# Patient Record
Sex: Male | Born: 1965 | Race: Black or African American | Hispanic: No | Marital: Single | Smoking: Former smoker
Health system: Southern US, Community
[De-identification: ages and names within clinical notes are randomized; demographics above are authoritative.]

## PROBLEM LIST (undated history)

## (undated) DIAGNOSIS — R079 Chest pain, unspecified: Secondary | ICD-10-CM

## (undated) DIAGNOSIS — R7989 Other specified abnormal findings of blood chemistry: Secondary | ICD-10-CM

## (undated) DIAGNOSIS — N183 Chronic kidney disease, stage 3 unspecified: Secondary | ICD-10-CM

## (undated) DIAGNOSIS — Z91148 Patient's other noncompliance with medication regimen for other reason: Secondary | ICD-10-CM

## (undated) DIAGNOSIS — I77810 Thoracic aortic ectasia: Secondary | ICD-10-CM

## (undated) DIAGNOSIS — R778 Other specified abnormalities of plasma proteins: Secondary | ICD-10-CM

## (undated) DIAGNOSIS — I10 Essential (primary) hypertension: Secondary | ICD-10-CM

## (undated) DIAGNOSIS — Z9114 Patient's other noncompliance with medication regimen: Secondary | ICD-10-CM

## (undated) DIAGNOSIS — I5032 Chronic diastolic (congestive) heart failure: Secondary | ICD-10-CM

## (undated) HISTORY — DX: Chest pain, unspecified: R07.9

## (undated) HISTORY — PX: CHOLECYSTECTOMY: SHX55

## (undated) HISTORY — DX: Other specified abnormal findings of blood chemistry: R79.89

## (undated) HISTORY — DX: Thoracic aortic ectasia: I77.810

## (undated) HISTORY — DX: Other specified abnormalities of plasma proteins: R77.8

## (undated) HISTORY — PX: HERNIA REPAIR: SHX51

---

## 2015-10-17 ENCOUNTER — Inpatient Hospital Stay
Admission: EM | Admit: 2015-10-17 | Discharge: 2015-10-21 | DRG: 291 | Disposition: A | Discharge status: Home / Self Care | Payer: Self-pay | Attending: Internal Medicine | Admitting: Internal Medicine

## 2015-10-17 ENCOUNTER — Encounter: Payer: Self-pay | Admitting: Emergency Medicine

## 2015-10-17 ENCOUNTER — Emergency Department: Payer: Self-pay

## 2015-10-17 DIAGNOSIS — E669 Obesity, unspecified: Secondary | ICD-10-CM | POA: Diagnosis present

## 2015-10-17 DIAGNOSIS — M25421 Effusion, right elbow: Secondary | ICD-10-CM

## 2015-10-17 DIAGNOSIS — Z6838 Body mass index (BMI) 38.0-38.9, adult: Secondary | ICD-10-CM

## 2015-10-17 DIAGNOSIS — I509 Heart failure, unspecified: Secondary | ICD-10-CM

## 2015-10-17 DIAGNOSIS — N183 Chronic kidney disease, stage 3 (moderate): Secondary | ICD-10-CM | POA: Diagnosis present

## 2015-10-17 DIAGNOSIS — G4733 Obstructive sleep apnea (adult) (pediatric): Secondary | ICD-10-CM | POA: Diagnosis present

## 2015-10-17 DIAGNOSIS — Z9114 Patient's other noncompliance with medication regimen: Secondary | ICD-10-CM

## 2015-10-17 DIAGNOSIS — M25429 Effusion, unspecified elbow: Secondary | ICD-10-CM | POA: Insufficient documentation

## 2015-10-17 DIAGNOSIS — I10 Essential (primary) hypertension: Secondary | ICD-10-CM | POA: Diagnosis present

## 2015-10-17 DIAGNOSIS — I160 Hypertensive urgency: Secondary | ICD-10-CM

## 2015-10-17 DIAGNOSIS — I130 Hypertensive heart and chronic kidney disease with heart failure and stage 1 through stage 4 chronic kidney disease, or unspecified chronic kidney disease: Principal | ICD-10-CM | POA: Diagnosis present

## 2015-10-17 DIAGNOSIS — N179 Acute kidney failure, unspecified: Secondary | ICD-10-CM | POA: Diagnosis present

## 2015-10-17 DIAGNOSIS — M70821 Other soft tissue disorders related to use, overuse and pressure, right upper arm: Secondary | ICD-10-CM | POA: Diagnosis present

## 2015-10-17 DIAGNOSIS — M25529 Pain in unspecified elbow: Secondary | ICD-10-CM

## 2015-10-17 DIAGNOSIS — I5033 Acute on chronic diastolic (congestive) heart failure: Secondary | ICD-10-CM | POA: Diagnosis present

## 2015-10-17 DIAGNOSIS — Z8249 Family history of ischemic heart disease and other diseases of the circulatory system: Secondary | ICD-10-CM

## 2015-10-17 DIAGNOSIS — M25521 Pain in right elbow: Secondary | ICD-10-CM

## 2015-10-17 DIAGNOSIS — I5021 Acute systolic (congestive) heart failure: Secondary | ICD-10-CM | POA: Diagnosis present

## 2015-10-17 DIAGNOSIS — Z87891 Personal history of nicotine dependence: Secondary | ICD-10-CM

## 2015-10-17 DIAGNOSIS — R079 Chest pain, unspecified: Secondary | ICD-10-CM

## 2015-10-17 HISTORY — DX: Essential (primary) hypertension: I10

## 2015-10-17 LAB — BRAIN NATRIURETIC PEPTIDE: B Natriuretic Peptide: 337 pg/mL — ABNORMAL HIGH (ref 0.0–100.0)

## 2015-10-17 LAB — COMPREHENSIVE METABOLIC PANEL
ALT: 43 U/L (ref 17–63)
AST: 38 U/L (ref 15–41)
Albumin: 3.9 g/dL (ref 3.5–5.0)
Alkaline Phosphatase: 83 U/L (ref 38–126)
Anion gap: 7 (ref 5–15)
BUN: 24 mg/dL — AB (ref 6–20)
CHLORIDE: 107 mmol/L (ref 101–111)
CO2: 27 mmol/L (ref 22–32)
CREATININE: 1.85 mg/dL — AB (ref 0.61–1.24)
Calcium: 8.8 mg/dL — ABNORMAL LOW (ref 8.9–10.3)
GFR, EST AFRICAN AMERICAN: 47 mL/min — AB (ref >60)
GFR, EST NON AFRICAN AMERICAN: 41 mL/min — AB (ref >60)
Glucose, Bld: 100 mg/dL — ABNORMAL HIGH (ref 65–99)
POTASSIUM: 3.4 mmol/L — AB (ref 3.5–5.1)
SODIUM: 141 mmol/L (ref 135–145)
Total Bilirubin: 0.5 mg/dL (ref 0.3–1.2)
Total Protein: 7.3 g/dL (ref 6.5–8.1)

## 2015-10-17 LAB — CBC
HEMATOCRIT: 41 % (ref 40.0–52.0)
HEMOGLOBIN: 13.8 g/dL (ref 13.0–18.0)
MCH: 29.4 pg (ref 26.0–34.0)
MCHC: 33.6 g/dL (ref 32.0–36.0)
MCV: 87.6 fL (ref 80.0–100.0)
Platelets: 228 10*3/uL (ref 150–440)
RBC: 4.68 MIL/uL (ref 4.40–5.90)
RDW: 13.7 % (ref 11.5–14.5)
WBC: 8.5 10*3/uL (ref 3.8–10.6)

## 2015-10-17 LAB — DIFFERENTIAL
BASOS ABS: 0.1 10*3/uL (ref 0–0.1)
BASOS PCT: 1 %
EOS ABS: 0.2 10*3/uL (ref 0–0.7)
Eosinophils Relative: 3 %
Lymphocytes Relative: 27 %
Lymphs Abs: 2.3 10*3/uL (ref 1.0–3.6)
Monocytes Absolute: 0.7 10*3/uL (ref 0.2–1.0)
Monocytes Relative: 8 %
NEUTROS ABS: 5.2 10*3/uL (ref 1.4–6.5)
Neutrophils Relative %: 61 %

## 2015-10-17 LAB — TROPONIN I
TROPONIN I: 0.27 ng/mL — AB (ref <0.03)
Troponin I: 0.36 ng/mL (ref <0.03)

## 2015-10-17 LAB — PROTIME-INR
INR: 1.04
PROTHROMBIN TIME: 13.8 s (ref 11.4–15.0)

## 2015-10-17 LAB — APTT: APTT: 28 s (ref 24–36)

## 2015-10-17 MED ORDER — ONDANSETRON HCL 4 MG PO TABS: 4.0000 mg | ORAL_TABLET | Freq: Four times a day (QID) | ORAL | Status: DC | PRN

## 2015-10-17 MED ORDER — MORPHINE SULFATE (PF) 2 MG/ML IV SOLN
2.0000 mg | INTRAVENOUS | Status: DC | PRN
  Administered 2015-10-18 – 2015-10-19 (×4): 2 mg via INTRAVENOUS
  Filled 2015-10-17 (×4): qty 1

## 2015-10-17 MED ORDER — ONDANSETRON HCL 4 MG/2ML IJ SOLN
4.0000 mg | Freq: Once | INTRAMUSCULAR | Status: AC
  Administered 2015-10-17: 4 mg via INTRAVENOUS
  Filled 2015-10-17: qty 2

## 2015-10-17 MED ORDER — HEPARIN SODIUM (PORCINE) 5000 UNIT/ML IJ SOLN
5000.0000 [IU] | Freq: Three times a day (TID) | INTRAMUSCULAR | Status: DC
  Administered 2015-10-18 (×2): 5000 [IU] via SUBCUTANEOUS
  Filled 2015-10-17 (×2): qty 1

## 2015-10-17 MED ORDER — LABETALOL HCL 5 MG/ML IV SOLN
10.0000 mg | INTRAVENOUS | Status: DC | PRN
  Administered 2015-10-18: 10 mg via INTRAVENOUS
  Administered 2015-10-19: 20 mg via INTRAVENOUS
  Filled 2015-10-17 (×2): qty 4

## 2015-10-17 MED ORDER — ASPIRIN 81 MG PO CHEW
324.0000 mg | CHEWABLE_TABLET | Freq: Once | ORAL | Status: AC
  Administered 2015-10-17: 324 mg via ORAL
  Filled 2015-10-17: qty 4

## 2015-10-17 MED ORDER — ACETAMINOPHEN 325 MG PO TABS
650.0000 mg | ORAL_TABLET | Freq: Four times a day (QID) | ORAL | Status: DC | PRN
  Administered 2015-10-20: 650 mg via ORAL
  Filled 2015-10-17: qty 2

## 2015-10-17 MED ORDER — NITROGLYCERIN 2 % TD OINT
1.0000 [in_us] | TOPICAL_OINTMENT | Freq: Once | TRANSDERMAL | Status: AC
  Administered 2015-10-17: 1 [in_us] via TOPICAL
  Filled 2015-10-17: qty 1

## 2015-10-17 MED ORDER — HYDRALAZINE HCL 20 MG/ML IJ SOLN
10.0000 mg | Freq: Once | INTRAMUSCULAR | Status: AC
  Administered 2015-10-17: 10 mg via INTRAVENOUS

## 2015-10-17 MED ORDER — HYDRALAZINE HCL 20 MG/ML IJ SOLN
20.0000 mg | Freq: Once | INTRAMUSCULAR | Status: DC
  Filled 2015-10-17: qty 1

## 2015-10-17 MED ORDER — ONDANSETRON HCL 4 MG/2ML IJ SOLN
4.0000 mg | Freq: Four times a day (QID) | INTRAMUSCULAR | Status: DC | PRN
  Administered 2015-10-18: 4 mg via INTRAVENOUS
  Filled 2015-10-17: qty 2

## 2015-10-17 MED ORDER — ACETAMINOPHEN 650 MG RE SUPP: 650.0000 mg | Freq: Four times a day (QID) | RECTAL | Status: DC | PRN

## 2015-10-17 MED ORDER — MORPHINE SULFATE (PF) 4 MG/ML IV SOLN
4.0000 mg | Freq: Once | INTRAVENOUS | Status: AC
  Administered 2015-10-17: 4 mg via INTRAVENOUS
  Filled 2015-10-17: qty 1

## 2015-10-17 MED ORDER — HYDROCODONE-ACETAMINOPHEN 5-325 MG PO TABS
1.0000 | ORAL_TABLET | ORAL | Status: DC | PRN
  Administered 2015-10-18 (×2): 1 via ORAL
  Filled 2015-10-17 (×3): qty 1

## 2015-10-17 MED ORDER — FUROSEMIDE 10 MG/ML IJ SOLN
60.0000 mg | Freq: Once | INTRAMUSCULAR | Status: AC
  Administered 2015-10-17: 60 mg via INTRAVENOUS
  Filled 2015-10-17: qty 8

## 2015-10-17 NOTE — Progress Notes (Signed)
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 When asked about his medication regimen, pt states that he doesn't get his medication filled because he is on the go from working so much. Pt's work schedule is not for financial reasons, but out of his enjoyment of working.  Per pt he attempts to watch what he is eating, but is not consistent. Also he "does not have the time to exercise". Rn educated the pt on the risk of not taking care of himself. Pt states that he does not see having a heart attack or stroke happening to him. RN reiterated the importance of changing lifestyle, and adequately taking care of himself. Will continue to monitor, and provide education as needed.

## 2015-10-17 NOTE — ED Notes (Signed)
Patient presents to the ED with shortness of breath and left sided chest pain x 2 days with right arm pain and weakness, dizziness, and headache.  Patient is alert and oriented x 4 and speaking in full sentences. Patient's smile appears slightly uneven to this RN.  Patient states all symptoms began two days ago.  Patient is in no obvious distress at this time.

## 2015-10-17 NOTE — ED Notes (Signed)
Meal tray given 

## 2015-10-17 NOTE — ED Provider Notes (Addendum)
[facility] Emergency Department Provider Note  ____________________________________________   I have reviewed the triage vital signs and the nursing notes.   HISTORY  Chief Complaint Shortness of Breath; Chest Pain; Dizziness; and Arm Pain    HPI Trevor Nunez is a 50 y.o. male presents today complaining of chest pain and right arm pain. Patient also has orthopnea, exertional shortness of breath. He states his symptoms of in there for 3 days. Patient has a history of untreated hypertension. Every time and he was ever checked his pressure he states is "very high". He was admitted for hypertensive urgency in [facility] 2 years ago but never followed up and does not take any medications. He does smoke until 2 months ago. He denies any history of PE or DVT. He states over the last couple weeks she's had increasing lower extremity edema. He states he began to develop orthopnea and exertional symptoms or last 3 days. He has had on and off chest discomfort which is a squeezing-like left-sided chest pain. He sometimes has pain in his right arm. He cannot say if the 2 are connected. Denies any significant headache or numbness or weakness. Patient does not have a history of CHF. He takes no medications. Does not know if he has a history of high cholesterol and does not follow with physician's. At this time, his pain is a 1 or 2 out of 10, "very slight".       Past Medical History  Diagnosis Date  . Hypertension     There are no active problems to display for this patient.   No past surgical history on file.  No current outpatient prescriptions on file.  Allergies Codeine  No family history on file.  Social History Social History  Substance Use Topics  . Smoking status: Former Smoker    Quit date: 08/17/2015  . Smokeless tobacco: None  . Alcohol Use: No    Review of Systems Constitutional: No fever/chills Eyes: No visual changes. ENT: No sore throat. No stiff  neck no neck pain Cardiovascular: Positive chest pain. Respiratory: Positive shortness of breath. Gastrointestinal:   no vomiting.  No diarrhea.  No constipation. Genitourinary: Positive for dysuria. Musculoskeletal: Negative lower extremity swelling Skin: Negative for rash. Neurological: Negative for headaches, focal weakness or numbness. 10-point ROS otherwise negative.  ____________________________________________   PHYSICAL EXAM:  VITAL SIGNS: ED Triage Vitals  Enc Vitals Group     BP 10/17/15 1709 197/115 mmHg     Pulse Rate 10/17/15 1709 84     Resp 10/17/15 1709 18     Temp 10/17/15 1709 98.4 F (36.9 C)     Temp Source 10/17/15 1709 Oral     SpO2 10/17/15 1709 96 %     Weight 10/17/15 1709 282 lb (127.914 kg)     Height 10/17/15 1709 6' (1.829 m)     Head Cir --      Peak Flow --      Pain Score 10/17/15 1710 7     Pain Loc --      Pain Edu? --      Excl. in GC? --     Constitutional: Alert and oriented. Well appearing and in no acute distress. Eyes: Conjunctivae are normal. PERRL. EOMI. Head: Atraumatic. Nose: No congestion/rhinnorhea. Mouth/Throat: Mucous membranes are moist.  Oropharynx non-erythematous. Neck: No stridor.   Nontender with no meningismus Cardiovascular: Normal rate, regular rhythm. Grossly normal heart sounds.  Good peripheral circulation. Chest: Somewhat tender to palpation, this  does seem to reproduce his pain. No crepitus or flail chest. Respiratory: Normal respiratory effort.  No retractions. Lungs CTAB, mildly diminished in the bases Abdominal: Soft and nontender. No distention. No guarding no rebound or obesity noted. Back:  There is no focal tenderness or step off there is no midline tenderness there are no lesions noted. there is no CVA tenderness Musculoskeletal: No lower extremity tenderness. No joint effusions, no DVT signs strong distal pulses 2+ pitting symmetric edema Neurologic:  Normal speech and language. No gross focal  neurologic deficits are appreciated.  Skin:  Skin is warm, dry and intact. No rash noted. Psychiatric: Mood and affect are normal. Speech and behavior are normal.  ____________________________________________   LABS (all labs ordered are listed, but only abnormal results are displayed)  Labs Reviewed  TROPONIN I - Abnormal; Notable for the following:    Troponin I 0.36 (*)    All other components within normal limits  COMPREHENSIVE METABOLIC PANEL - Abnormal; Notable for the following:    Potassium 3.4 (*)    Glucose, Bld 100 (*)    BUN 24 (*)    Creatinine, Ser 1.85 (*)    Calcium 8.8 (*)    GFR calc non Af Amer 41 (*)    GFR calc Af Amer 47 (*)    All other components within normal limits  BRAIN NATRIURETIC PEPTIDE - Abnormal; Notable for the following:    B Natriuretic Peptide 337.0 (*)    All other components within normal limits  CBC  PROTIME-INR  APTT  DIFFERENTIAL   ____________________________________________  EKG  I personally interpreted any EKGs ordered by me or triage Normal sinus rhythm rate 83 bpm, there is J-point elevation in V1 and V2, no acute ST depression, LAD is noted, LVH is noted. LAFB is noted.  Repeat EKG shows again some J-point elevation or acute ST elevation otherwise, nonspecific ST changes, LAD, LAFB. ____________________________________________  RADIOLOGY  I reviewed any imaging ordered by me or triage that were performed during my shift and, if possible, patient and/or family made aware of any abnormal findings. ____________________________________________   PROCEDURES  Procedure(s) performed: None  Critical Care performed: None  ____________________________________________   INITIAL IMPRESSION / ASSESSMENT AND PLAN / ED COURSE  Pertinent labs & imaging results that were available during my care of the patient were reviewed by me and considered in my medical decision making (see chart for details).  Patient with chronically  very poorly controlled hypertension who is noncompliant with any medications and does not have any cardiac history that he knows of presents today with chest pain on the left side which is been there for 3 days as well as right arm pain. Patient at this time states his pain is nearly gone, one or 2 out of 10. I was concerned about his EKG I did talk to Dr. Hillis RangeJames Allred of radiology who also evaluated the EKG he does not feel this meets criteria for ST elevation myocardial infarction. He agrees with nitroglycerin, or blood pressure goal for this patient is difficult to assess as the patient has uncontrolled hypertension at baseline and he is not sure what numbers he normally has. It is clear that [facility] with uncontrolled hypertension at that time. Patient will be given aspirin here we will reassess. His troponin is 0.36 which is certainly borderline. He may benefit from heparin drip. We will reassess closely.  ----------------------------------------- 6:30 PM on 10/17/2015 -----------------------------------------  The patient remains in no acute distress complaining of mild ongoing chest discomfort which does not seem to have been changed for the nitroglycerin which he just got. Aspirin has been administered. Very difficult to know exactly what blood pressure which name for this patient given his very significant history of very poorly controlled hypertension. I am reluctant to accidentally bring his pressure down too quickly. We will try morphine, we will give him Lasix for his evidence of CHF as is evident clinically and by lab work, and we will monitor him closely. We'll give him hydralazine if this is not resolving   ----------------------------------------- 8:03 PM on 10/17/2015 ----------------------------------------- Patient feels "practically" pain-free blood pressures in the 170s will admit to the hospital.  Repeat EKG does not show any acute  ischemia. We'll admit to the hospitalist.  ____________________________________________   FINAL CLINICAL IMPRESSION(S) / ED DIAGNOSES  Final diagnoses:  None      This chart was dictated using voice recognition software.  Despite best efforts to proofread,  errors can occur which can change meaning.     Jeanmarie PlantJames A Bartlett Enke, MD 10/17/15 1809  Jeanmarie PlantJames A Albin Duckett, MD 10/17/15 [id number]  Jeanmarie PlantJames A Japhet Morgenthaler, MD 10/17/15 2003  Jeanmarie PlantJames A Sukhman Martine, MD 10/17/15 2004

## 2015-10-17 NOTE — H&P (Signed)
[facility]   PATIENT NAME: Trevor Nunez    MR#:  [id number]  DATE OF BIRTH:  04/07/1966  DATE OF ADMISSION:  10/17/2015  PRIMARY CARE PHYSICIAN: No PCP Per Patient   REQUESTING/REFERRING PHYSICIAN: Alphonzo Lemmings, MD  CHIEF COMPLAINT:   Chief Complaint  Patient presents with  . Shortness of Breath  . Chest Pain  . Dizziness  . Arm Pain    HISTORY OF PRESENT ILLNESS:  Trevor Nunez  is a 50 y.o. male who presents with Chest pain radiating to his right arm for the last 4-5 days, shortness of breath, orthopnea. Patient states this chest pain has been constant during this time, does get worse with exertion, and is associated with some nausea. He denies diaphoresis. He does state that he has had some headaches. In the ED today he was found to have an elevated troponin, elevated BNP, significantly elevated blood pressure. Given his picture of elevated troponin, likely acute heart failure, an accelerated hypertension hospitalists were called for admission and further evaluation.  PAST MEDICAL HISTORY:   Past Medical History  Diagnosis Date  . Hypertension     PAST SURGICAL HISTORY:   Past Surgical History  Procedure Laterality Date  . Cholecystectomy    . Hernia repair      SOCIAL HISTORY:   Social History  Substance Use Topics  . Smoking status: Former Smoker    Quit date: 08/17/2015  . Smokeless tobacco: Not on file  . Alcohol Use: No    FAMILY HISTORY:   Family History  Problem Relation Age of Onset  . Hypertension Mother     DRUG ALLERGIES:   Allergies  Allergen Reactions  . Codeine Palpitations    MEDICATIONS AT HOME:   Prior to Admission medications   Medication Sig Start Date End Date Taking? Authorizing Provider  ibuprofen (ADVIL,MOTRIN) 200 MG tablet Take 400 mg by mouth every 6 (six) hours as needed for headache or mild pain.   Yes Historical Provider, MD    REVIEW OF SYSTEMS:  Review of Systems   Constitutional: Negative for fever, chills, weight loss and malaise/fatigue.  HENT: Negative for ear pain, hearing loss and tinnitus.   Eyes: Negative for blurred vision, double vision, pain and redness.  Respiratory: Positive for shortness of breath. Negative for cough and hemoptysis.   Cardiovascular: Positive for chest pain, orthopnea and leg swelling. Negative for palpitations.  Gastrointestinal: Negative for nausea, vomiting, abdominal pain, diarrhea and constipation.  Genitourinary: Negative for dysuria, frequency and hematuria.  Musculoskeletal: Negative for back pain, joint pain and neck pain.  Skin:       No acne, rash, or lesions  Neurological: Positive for headaches. Negative for dizziness, tremors, focal weakness and weakness.  Endo/Heme/Allergies: Negative for polydipsia. Does not bruise/bleed easily.  Psychiatric/Behavioral: Negative for depression. The patient is not nervous/anxious and does not have insomnia.      VITAL SIGNS:   Filed Vitals:   10/17/15 1934 10/17/15 1950 10/17/15 1955 10/17/15 2000  BP: 210/129 191/129 179/64 167/101  Pulse: 82 86 88 97  Temp:      TempSrc:      Resp: Height:      Weight:      SpO2: 91% 95% 95% 95%   Wt Readings from Last 3 Encounters:  10/17/15 127.914 kg (282 lb)    PHYSICAL EXAMINATION:  Physical Exam  Vitals reviewed. Constitutional: He is oriented to person, place, and time. He  appears well-developed and well-nourished. No distress.  HENT:  Head: Normocephalic and atraumatic.  Mouth/Throat: Oropharynx is clear and moist.  Eyes: Conjunctivae and EOM are normal. Pupils are equal, round, and reactive to light. No scleral icterus.  Neck: Normal range of motion. Neck supple. No JVD present. No thyromegaly present.  Cardiovascular: Normal rate, regular rhythm and intact distal pulses.  Exam reveals no gallop and no friction rub.   No murmur heard. Respiratory: Effort normal. No respiratory distress. He has no  wheezes. He has rales.  GI: Soft. Bowel sounds are normal. He exhibits no distension. There is no tenderness.  Musculoskeletal: Normal range of motion. He exhibits edema.  No arthritis, no gout  Lymphadenopathy:    He has no cervical adenopathy.  Neurological: He is alert and oriented to person, place, and time. No cranial nerve deficit.  No dysarthria, no aphasia  Skin: Skin is warm and dry. No rash noted. No erythema.  Psychiatric: He has a normal mood and affect. His behavior is normal. Judgment and thought content normal.    LABORATORY PANEL:   CBC  Recent Labs Lab 10/17/15 1713  WBC 8.5  HGB 13.8  HCT 41.0  PLT 228   ------------------------------------------------------------------------------------------------------------------  Chemistries   Recent Labs Lab 10/17/15 1713  NA 141  K 3.4*  CL 107  CO2 27  GLUCOSE 100*  BUN 24*  CREATININE 1.85*  CALCIUM 8.8*  AST 38  ALT 43  ALKPHOS 83  BILITOT 0.5   ------------------------------------------------------------------------------------------------------------------  Cardiac Enzymes  Recent Labs Lab 10/17/15 1713  TROPONINI 0.36*   ------------------------------------------------------------------------------------------------------------------  RADIOLOGY:  Dg Chest 2 View  10/17/2015  CLINICAL DATA:  Chest pain and short of breath EXAM: CHEST  2 VIEW COMPARISON:  None. FINDINGS: Heart size within normal limits. Prominent lung markings in the bases without focal infiltrate. Vascularity appears be within normal limits. No pleural effusion. Negative for mass or adenopathy. IMPRESSION: Prominent lung markings in the bases. This may be due to chronic lung disease however no prior studies available to confirm. No definite pneumonia or heart failure. Electronically Signed   By: Marlan Palauharles  Clark M.D.   On: 10/17/2015 17:44   Ct Head Wo Contrast  10/17/2015  CLINICAL DATA:  Right arm weakness. Dizziness headache  and hypertension EXAM: CT HEAD WITHOUT CONTRAST TECHNIQUE: Contiguous axial images were obtained from the base of the skull through the vertex without intravenous contrast. COMPARISON:  None. FINDINGS: Ventricle size normal.  Cerebral volume normal. Negative for acute or chronic infarction. Negative for hemorrhage or mass lesion. No shift of the midline structures. Negative calvarium. IMPRESSION: Negative Electronically Signed   By: Marlan Palauharles  Clark M.D.   On: 10/17/2015 17:46    EKG:   Orders placed or performed during the hospital encounter of 10/17/15  . EKG 12-Lead  . EKG 12-Lead  . ED EKG within 10 minutes  . ED EKG within 10 minutes  . ED EKG  . ED EKG    IMPRESSION AND PLAN:  Principal Problem:   Chest pain - unclear if this is related to his heart failure, possibly due to long-standing untreated hypertension. Patient states he has not taken any medications for his high blood pressure for a long time. However, this could represent some new acute primary ACS. We will admit him, trend his troponins tonight, control his blood pressure and heart rate, get an echocardiogram in the morning, get a cardiology consult. Active Problems:   Accelerated hypertension - IV meds to lower his blood pressure,  he will need to be started on by mouth antihypertensives prior to discharge   Acute systolic CHF (congestive heart failure) (HCC) - unclear if it is potentially related to an acute ACS versus long-standing effects of his untreated hypertension. We'll control his blood pressure tonight, give him some IV diuretics, and get a cardiology consult with other workup as above.   AKI (acute kidney injury) (HCC) - he needs some diuresis tonight, we will do this gently and monitor his kidney function closely. Avoid nephrotoxins  All the records are reviewed and case discussed with ED provider. Management plans discussed with the patient and/or family.  DVT PROPHYLAXIS: SubQ heparin  GI PROPHYLAXIS:  None  ADMISSION STATUS: Inpatient  CODE STATUS: Full Code Status History    This patient does not have a recorded code status. Please follow your organizational policy for patients in this situation.      TOTAL TIME TAKING CARE OF THIS PATIENT: 45 minutes.    Jenesa Foresta FIELDING 10/17/2015, 8:48 PM  [facility]  Office  [phone]  CC: Primary care physician; No PCP Per Patient

## 2015-10-18 ENCOUNTER — Inpatient Hospital Stay (HOSPITAL_COMMUNITY)
Admit: 2015-10-18 | Discharge: 2015-10-18 | Disposition: A | Discharge status: Home / Self Care | Payer: Self-pay | Attending: Internal Medicine | Admitting: Internal Medicine

## 2015-10-18 ENCOUNTER — Inpatient Hospital Stay: Payer: Self-pay

## 2015-10-18 DIAGNOSIS — M25429 Effusion, unspecified elbow: Secondary | ICD-10-CM | POA: Insufficient documentation

## 2015-10-18 DIAGNOSIS — N179 Acute kidney failure, unspecified: Secondary | ICD-10-CM

## 2015-10-18 DIAGNOSIS — M25529 Pain in unspecified elbow: Secondary | ICD-10-CM

## 2015-10-18 DIAGNOSIS — R079 Chest pain, unspecified: Secondary | ICD-10-CM

## 2015-10-18 DIAGNOSIS — I10 Essential (primary) hypertension: Secondary | ICD-10-CM

## 2015-10-18 DIAGNOSIS — M25521 Pain in right elbow: Secondary | ICD-10-CM

## 2015-10-18 DIAGNOSIS — M25421 Effusion, right elbow: Secondary | ICD-10-CM

## 2015-10-18 DIAGNOSIS — Z9119 Patient's noncompliance with other medical treatment and regimen: Secondary | ICD-10-CM

## 2015-10-18 DIAGNOSIS — I5033 Acute on chronic diastolic (congestive) heart failure: Secondary | ICD-10-CM | POA: Insufficient documentation

## 2015-10-18 DIAGNOSIS — Z9114 Patient's other noncompliance with medication regimen: Secondary | ICD-10-CM | POA: Insufficient documentation

## 2015-10-18 DIAGNOSIS — I509 Heart failure, unspecified: Secondary | ICD-10-CM | POA: Insufficient documentation

## 2015-10-18 LAB — CBC
HCT: 38.9 % — ABNORMAL LOW (ref 40.0–52.0)
Hemoglobin: 13.6 g/dL (ref 13.0–18.0)
MCH: 30 pg (ref 26.0–34.0)
MCHC: 35 g/dL (ref 32.0–36.0)
MCV: 85.7 fL (ref 80.0–100.0)
PLATELETS: 233 10*3/uL (ref 150–440)
RBC: 4.54 MIL/uL (ref 4.40–5.90)
RDW: 13.2 % (ref 11.5–14.5)
WBC: 6.3 10*3/uL (ref 3.8–10.6)

## 2015-10-18 LAB — ECHOCARDIOGRAM COMPLETE
AOASC: 41 cm
AV pk vel: 133 cm/s
AVPG: 7 mmHg
E decel time: 201 msec
FS: 40 % (ref 28–44)
HEIGHTINCHES: 72 in
IV/PV OW: 1.28
LA ID, A-P, ES: 46 mm
LA diam index: 1.89 cm/m2
LA vol A4C: 57.8 ml
LA vol index: 25.2 mL/m2
LAVOL: 61.2 mL
LDCA: 4.15 cm2
LEFT ATRIUM END SYS DIAM: 46 mm
LV PW d: 14.8 mm — AB (ref 0.6–1.1)
LVOT diameter: 23 mm
MV Dec: 201
MVPG: 3 mmHg
MVPKAVEL: 50.9 m/s
MVPKEVEL: 80 m/s
WEIGHTICAEL: 4363.2 [oz_av]

## 2015-10-18 LAB — BASIC METABOLIC PANEL
Anion gap: 7 (ref 5–15)
BUN: 21 mg/dL — AB (ref 6–20)
CALCIUM: 8.2 mg/dL — AB (ref 8.9–10.3)
CHLORIDE: 105 mmol/L (ref 101–111)
CO2: 28 mmol/L (ref 22–32)
CREATININE: 1.66 mg/dL — AB (ref 0.61–1.24)
GFR calc non Af Amer: 46 mL/min — ABNORMAL LOW (ref >60)
GFR, EST AFRICAN AMERICAN: 54 mL/min — AB (ref >60)
GLUCOSE: 109 mg/dL — AB (ref 65–99)
Potassium: 3.4 mmol/L — ABNORMAL LOW (ref 3.5–5.1)
Sodium: 140 mmol/L (ref 135–145)

## 2015-10-18 LAB — TROPONIN I
TROPONIN I: 0.24 ng/mL — AB (ref <0.03)
TROPONIN I: 0.18 ng/mL — AB (ref <0.03)
Troponin I: 0.21 ng/mL (ref <0.03)

## 2015-10-18 MED ORDER — METOPROLOL TARTRATE 25 MG PO TABS
25.0000 mg | ORAL_TABLET | Freq: Two times a day (BID) | ORAL | Status: DC
  Administered 2015-10-18 – 2015-10-19 (×4): 25 mg via ORAL
  Filled 2015-10-18 (×4): qty 1

## 2015-10-18 MED ORDER — NITROGLYCERIN 0.4 MG SL SUBL
0.4000 mg | SUBLINGUAL_TABLET | SUBLINGUAL | Status: DC | PRN
  Administered 2015-10-18: 0.4 mg via SUBLINGUAL
  Filled 2015-10-18: qty 1

## 2015-10-18 MED ORDER — HYDROCODONE-ACETAMINOPHEN 5-325 MG PO TABS
1.0000 | ORAL_TABLET | ORAL | Status: DC | PRN
  Administered 2015-10-18 – 2015-10-20 (×8): 2 via ORAL
  Filled 2015-10-18 (×8): qty 2

## 2015-10-18 MED ORDER — HEPARIN BOLUS VIA INFUSION
4000.0000 [IU] | Freq: Once | INTRAVENOUS | Status: AC
  Administered 2015-10-18: 4000 [IU] via INTRAVENOUS
  Filled 2015-10-18: qty 4000

## 2015-10-18 MED ORDER — PREDNISONE 50 MG PO TABS
50.0000 mg | ORAL_TABLET | Freq: Every day | ORAL | Status: DC
  Administered 2015-10-19 – 2015-10-20 (×2): 50 mg via ORAL
  Filled 2015-10-18 (×2): qty 1

## 2015-10-18 MED ORDER — HEPARIN (PORCINE) IN NACL 100-0.45 UNIT/ML-% IJ SOLN
1400.0000 [IU]/h | INTRAMUSCULAR | Status: DC
  Administered 2015-10-18 (×2): 1400 [IU]/h via INTRAVENOUS
  Filled 2015-10-18: qty 250

## 2015-10-18 NOTE — Progress Notes (Signed)
Patient ID: Trevor Nunez, male   DOB: December 31, 1965, 50 y.o.   MRN: [id number] [facility] [place] [facility]   PATIENT NAME: Trevor Senegalroy Fini    MR#:  [id number]  DATE OF BIRTH:  December 31, 1965  SUBJECTIVE:  Patient presented with burning chest pain and right elbow pain on and off for last 4 days. Has diagnoses of hypertension however has been noncompliant with medications. Unable to lay down flat for last several weeks. Has bilateral leg swelling as well. Admitted to telemetry floor found to have elevated troponin. Patient continues today with burning chest pain as well.  REVIEW OF SYSTEMS:   Review of Systems  Constitutional: Negative for fever, chills and weight loss.  HENT: Negative for ear discharge, ear pain and nosebleeds.   Eyes: Negative for blurred vision, pain and discharge.  Respiratory: Positive for shortness of breath. Negative for sputum production, wheezing and stridor.   Cardiovascular: Positive for chest pain. Negative for palpitations, orthopnea and PND.  Gastrointestinal: Negative for nausea, vomiting, abdominal pain and diarrhea.  Genitourinary: Negative for urgency and frequency.  Musculoskeletal: Positive for joint pain. Negative for back pain.  Neurological: Positive for weakness. Negative for sensory change, speech change and focal weakness.  Psychiatric/Behavioral: Negative for depression and hallucinations. The patient is not nervous/anxious.   All other systems reviewed and are negative.  Tolerating Diet:npo Tolerating PT:not needed   DRUG ALLERGIES:   Allergies  Allergen Reactions  . Codeine Palpitations    VITALS:  Blood pressure 153/109, pulse 87, temperature 98.3 F (36.8 C), temperature source Oral, resp. rate 16, height 6' (1.829 m), weight 123.696 kg (272 lb 11.2 oz), SpO2 100 %.  PHYSICAL EXAMINATION:   Physical Exam  GENERAL:  [age] y.o.-year-old patient lying in the bed with no acute distress.obese  EYES: Pupils equal,  round, reactive to light and accommodation. No scleral icterus. Extraocular muscles intact.  HEENT: Head atraumatic, normocephalic. Oropharynx and nasopharynx clear.  NECK:  Supple, no jugular venous distention. No thyroid enlargement, no tenderness.  LUNGS: Normal breath sounds bilaterally, no wheezing, rales, rhonchi. No use of accessory muscles of respiration.  CARDIOVASCULAR: S1, S2 normal. No murmurs, rubs, or gallops.  ABDOMEN: Soft, nontender, nondistended. Bowel sounds present. No organomegaly or mass.  EXTREMITIES: No cyanosis, clubbing or edema b/l.   Right elbow decreased range of motion tenderness on palpation NEUROLOGIC: Cranial nerves II through XII are intact. No focal Motor or sensory deficits b/l.   PSYCHIATRIC:  patient is alert and oriented x 3.  SKIN: No obvious rash, lesion, or ulcer.   LABORATORY PANEL:  CBC  Recent Labs Lab 10/18/15 0303  WBC 6.3  HGB 13.6  HCT 38.9*  PLT 233    Chemistries   Recent Labs Lab 10/17/15 1713 10/18/15 0303  NA 141 140  K 3.4* 3.4*  CL 107 105  CO2 27 28  GLUCOSE 100* 109*  BUN 24* 21*  CREATININE 1.85* 1.66*  CALCIUM 8.8* 8.2*  AST 38  --   ALT 43  --   ALKPHOS 83  --   BILITOT 0.5  --    Cardiac Enzymes  Recent Labs Lab 10/18/15 0837  TROPONINI 0.21*   RADIOLOGY:  Dg Chest 2 View  10/17/2015  CLINICAL DATA:  Chest pain and short of breath EXAM: CHEST  2 VIEW COMPARISON:  None. FINDINGS: Heart size within normal limits. Prominent lung markings in the bases without focal infiltrate. Vascularity appears be within normal limits. No pleural effusion. Negative for mass  or adenopathy. IMPRESSION: Prominent lung markings in the bases. This may be due to chronic lung disease however no prior studies available to confirm. No definite pneumonia or heart failure. Electronically Signed   By: Marlan Palauharles  Clark M.D.   On: 10/17/2015 17:44   Dg Elbow Complete Right (3+view)  10/18/2015  CLINICAL DATA:  Right elbow pain and  swelling with decreased range of motion. No reported injury. EXAM: RIGHT ELBOW - COMPLETE 3+ VIEW COMPARISON:  None. FINDINGS: Small right elbow joint effusion. No fracture, dislocation or suspicious focal osseous lesion. No evidence of degenerative or erosive arthropathy. Moderate right olecranon enthesophyte. Small enthesophyte at the ulnar side of the proximal right radial shaft. IMPRESSION: 1. Small right elbow joint effusion. No right elbow fracture detected. No malalignment. 2. Olecranon and proximal right radial shaft enthesophytes. Electronically Signed   By: Delbert PhenixJason A Poff M.D.   On: 10/18/2015 10:20   Ct Head Wo Contrast  10/17/2015  CLINICAL DATA:  Right arm weakness. Dizziness headache and hypertension EXAM: CT HEAD WITHOUT CONTRAST TECHNIQUE: Contiguous axial images were obtained from the base of the skull through the vertex without intravenous contrast. COMPARISON:  None. FINDINGS: Ventricle size normal.  Cerebral volume normal. Negative for acute or chronic infarction. Negative for hemorrhage or mass lesion. No shift of the midline structures. Negative calvarium. IMPRESSION: Negative Electronically Signed   By: Marlan Palauharles  Clark M.D.   On: 10/17/2015 17:46   ASSESSMENT AND PLAN:  Trevor Senegalroy Veitch is a 50 y.o. male who presents with Chest pain radiating to his right arm for the last 4-5 days, shortness of breath, orthopnea. Patient states this chest pain has been constant during this time, does get worse with exertion, and is associated with some nausea. He denies diaphoresis. He does state that he has had some headaches  1.Acute NQWMI -  Patient's cardiac enzymes have been borderline elevated. He continues with chest pain. -Start IV heparin drip per nomogram -Cardiology consultation -By mouth aspirin, beta blockers, statins, sublingual nitroglycerin -Risk factors of untreated hypertension, strong family history of CAD, obesity, history of tobacco abuse  2. Accelerated hypertension - IV meds to  lower his blood pressure, -On beta blockers  3. Acute CHF (congestive heart failure) (HCC) - unclear if it is potentially related to an acute ACS versus long-standing effects of his untreated hypertension. -Pending echo. EF unknown -IV Lasix -Cardiology consultation  4. AKI (acute kidney injury) (HCC) - he needs some diuresis tonight, we will do this gently and monitor his kidney function closely. Avoid nephrotoxins  5. Suspected obstructive sleep apnea. -Patient will need sleep study as outpatient  6. Right elbow pain appears due to overuse of joint and likely underlying bursitis. -X-ray elbow shows effusion -Start patient on prednisone taper, heating pad, when necessary pain meds -Consider orthotic consult if symptoms are unimproved  Case discussed with Care Management/Social Worker. Management plans discussed with the patient, family and they are in agreement.  CODE STATUS: Full  DVT Prophylaxis: Heparin drip   TOTAL TIME TAKING CARE OF THIS PATIENT: 35 minutes.  >50% time spent on counselling and coordination of care  POSSIBLE D/C IN one to 2 DAYS, DEPENDING ON CLINICAL CONDITION.  Note: This dictation was prepared with Dragon dictation along with smaller phrase technology. Any transcriptional errors that result from this process are unintentional.  Hayla Hinger M.D on 10/18/2015 at 11:45 AM  Between 7am to 6pm - Pager - [phone]  After 6pm go to [email or web address] - password [email or web address]  [facility]  Office  [phone]  CC: Primary care physician; No PCP Per Patient

## 2015-10-18 NOTE — Progress Notes (Signed)
*  PRELIMINARY RESULTS* °Echocardiogram °2D Echocardiogram has been performed. ° °Trevor Nunez °10/18/2015, 12:59 PM °

## 2015-10-18 NOTE — Progress Notes (Signed)
Report given to Larose Hiresammy Todd, RN At present pt in no distress resting quietly, call bell in reach , phone number on board for reference.

## 2015-10-18 NOTE — Consult Note (Signed)
Cardiology Consultation Note  Patient ID: Trevor Nunez, MRN: [id number], DOB/AGE: 07/27/1965 50 y.o. Admit date: 10/17/2015   Date of Consult: 10/18/2015 Primary Physician: No PCP Per Patient Primary Cardiologist: None  Chief Complaint: Right arm pain, shortness of breath, chest pain Reason for Consult: Elevated cardiac enzymes, shortness of breath, hypertension Consult placed by Dr. Anne HahnWillis for above  HPI: 50 y.o. male with h/o malignant hypertension, poorly controlled over the past 20 years per notes, [facility] hospital [facility], December 2015, presenting with right arm pain. The past 4-5 days, worsening shortness of breath, orthopnea.   He reports he is not been taking his medications for many months He has a history of medication noncompliance. Does not have primary care physician. Biggest complaint is right arm pain around his right elbow. Denies any trauma, denies any heavy lifting that could have precipitated his symptoms.  He does report having chronic lower extremity edema, chronic mild shortness of breath on exertion Mild PND and orthopnea  He does report some chest pain described as a "sticking" in his chest, was 8/10 yesterday, 4/10 today Feels like needle sticking into him  Labwork on arrival showing troponin 0.36 trending down to 0.21 BNP elevated 337, potassium low 3.4 Creatinine elevated 1.8, normal CBC    Past Medical History  Diagnosis Date  . Hypertension       Most Recent Cardiac Studies: Echocardiogram pending    Surgical History:  Past Surgical History  Procedure Laterality Date  . Cholecystectomy    . Hernia repair       Home Meds: Prior to Admission medications   Medication Sig Start Date End Date Taking? Authorizing Provider  ibuprofen (ADVIL,MOTRIN) 200 MG tablet Take 400 mg by mouth every 6 (six) hours as needed for headache or mild pain.   Yes Historical Provider, MD    Inpatient  Medications:  . metoprolol tartrate  25 mg Oral BID  . [START ON 10/19/2015] predniSONE  50 mg Oral Q breakfast      Allergies:  Allergies  Allergen Reactions  . Codeine Palpitations    Social History   Social History  . Marital Status: Single    Spouse Name: N/A  . Number of Children: N/A  . Years of Education: N/A   Occupational History  . Not on file.   Social History Main Topics  . Smoking status: Former Smoker    Quit date: 08/17/2015  . Smokeless tobacco: Not on file  . Alcohol Use: No  . Drug Use: No  . Sexual Activity: Not on file   Other Topics Concern  . Not on file   Social History Narrative  . No narrative on file     Family History  Problem Relation Age of Onset  . Hypertension Mother      Review of Systems: Review of Systems  Constitutional: Negative.   Respiratory: Positive for shortness of breath.   Cardiovascular: Positive for chest pain and leg swelling.  Gastrointestinal: Negative.   Musculoskeletal: Positive for joint pain.       Severe right arm pain, unable to bend it  Neurological: Negative.   Psychiatric/Behavioral: Negative.   All other systems reviewed and are negative.   Labs:  Recent Labs  10/17/15 1713 10/17/15 2109 10/18/15 0303 10/18/15 0837  TROPONINI 0.36* 0.27* 0.24* 0.21*   Lab Results  Component Value Date   WBC 6.3 10/18/2015   HGB 13.6 10/18/2015   HCT 38.9* 10/18/2015  MCV 85.7 10/18/2015   PLT 233 10/18/2015    Recent Labs Lab 10/17/15 1713 10/18/15 0303  NA 141 140  K 3.4* 3.4*  CL 107 105  CO2 27 28  BUN 24* 21*  CREATININE 1.85* 1.66*  CALCIUM 8.8* 8.2*  PROT 7.3  --   BILITOT 0.5  --   ALKPHOS 83  --   ALT 43  --   AST 38  --   GLUCOSE 100* 109*   No results found for: CHOL, HDL, LDLCALC, TRIG No results found for: DDIMER  Radiology/Studies:  Dg Chest 2 View  10/17/2015  CLINICAL DATA:  Chest pain and short of breath EXAM: CHEST  2 VIEW COMPARISON:  None. FINDINGS: Heart size  within normal limits. Prominent lung markings in the bases without focal infiltrate. Vascularity appears be within normal limits. No pleural effusion. Negative for mass or adenopathy. IMPRESSION: Prominent lung markings in the bases. This may be due to chronic lung disease however no prior studies available to confirm. No definite pneumonia or heart failure. Electronically Signed   By: Marlan Palau M.D.   On: 10/17/2015 17:44   Dg Elbow Complete Right (3+view)  10/18/2015  CLINICAL DATA:  Right elbow pain and swelling with decreased range of motion. No reported injury. EXAM: RIGHT ELBOW - COMPLETE 3+ VIEW COMPARISON:  None. FINDINGS: Small right elbow joint effusion. No fracture, dislocation or suspicious focal osseous lesion. No evidence of degenerative or erosive arthropathy. Moderate right olecranon enthesophyte. Small enthesophyte at the ulnar side of the proximal right radial shaft. IMPRESSION: 1. Small right elbow joint effusion. No right elbow fracture detected. No malalignment. 2. Olecranon and proximal right radial shaft enthesophytes. Electronically Signed   By: Delbert Phenix M.D.   On: 10/18/2015 10:20   Ct Head Wo Contrast  10/17/2015  CLINICAL DATA:  Right arm weakness. Dizziness headache and hypertension EXAM: CT HEAD WITHOUT CONTRAST TECHNIQUE: Contiguous axial images were obtained from the base of the skull through the vertex without intravenous contrast. COMPARISON:  None. FINDINGS: Ventricle size normal.  Cerebral volume normal. Negative for acute or chronic infarction. Negative for hemorrhage or mass lesion. No shift of the midline structures. Negative calvarium. IMPRESSION: Negative Electronically Signed   By: Marlan Palau M.D.   On: 10/17/2015 17:46    EKG: EKG shows normal sinus rhythm with early repolarization abnormality anterior precordial leads, in particular V1 through V3, ST and T wave abnormality in lead 1 and aVL also with APCs  Weights: Filed Weights   10/17/15 1709  10/18/15 0529  Weight: 282 lb (127.914 kg) 272 lb 11.2 oz (123.696 kg)     Physical Exam: Blood pressure 145/77, pulse 77, temperature 98.6 F (37 C), temperature source Oral, resp. rate 18, height 6' (1.829 m), weight 272 lb 11.2 oz (123.696 kg), SpO2 97 %. Body mass index is 36.98 kg/(m^2). General: Well developed, well nourished, in no acute distress. Head: Normocephalic, atraumatic, sclera non-icteric, no xanthomas, nares are without discharge.  Neck: Negative for carotid bruits. JVD not elevated. Lungs: Clear bilaterally to auscultation without wheezes, rales, or rhonchi. Breathing is unlabored. Heart: RRR with S1 S2. No murmurs, rubs, or gallops appreciated. Abdomen: Soft, non-tender, non-distended with normoactive bowel sounds. No hepatomegaly. No rebound/guarding. No obvious abdominal masses. Msk:  Strength and tone appear normal for age. Extremities: No clubbing or cyanosis. No edema.  Distal pedal pulses are 2+ and equal bilaterally.He is holding his right arm up on a pillow, will not bend the arm secondary  to pain. Appears to have mild effusion Neuro: Alert and oriented X 3. No facial asymmetry. No focal deficit. Moves all extremities spontaneously. Psych:  Responds to questions appropriately with a normal affect.    Assessment and Plan:   1. Elevated troponin Likely secondary to poor control hypertension, severe/malignant Medication noncompliance Trend of lab work is stable, no indication for heparin Chest pain symptoms atypical, described as a "poke with a needle"  Given no primary care and no insurance, medication noncompliance over several years, would benefit from [facility], medication assistance to prevent readmission  2) malignant hypertension Likely the cause of most of his issues As above, medication noncompliance Blood pressure has improved on most recent recheck down to 140 systolic He has received labetalol, hydralazine, metoprolol -Would avoid calcium  channel blockers such as amlodipine given leg edema He would likely benefit from labetalol 3 times a day with hydralazine tid  3) chronic diastolic CHF Exacerbated by severe, uncontrolled hypertension, diastolic dysfunction from long-standing hypertension We have stressed him the importance of better blood pressure control He might benefit from CHF clinic  4) right arm pain   started on prednisone for possible bursitis Consider gout given severity of symptoms. May need to check uric acid  5) chronic renal failure Likely secondary to long-standing poorly controlled hypertension Stressed importance of blood pressure control with the patient in an effort to avoid hemodialysis later in life  6) suspected sleep apnea Likely contributing to CHF, shortness of breath symptoms Perhaps workup could be arranged to [facility]  7) leg edema, bilateral Secondary to acute on chronic diastolic CHF   Long discussion with patient concerning need for compliance with his blood pressure pills, effect on the heart Need for close follow-up with physician Notes indicating long-standing history of medication noncompliance   Total encounter time more than 110 minutes  Greater than 50% was spent in counseling and coordination of care with the patient   Signed, Dossie Arbourim Evaleigh Mccamy, MD. Ph.D [facility] 10/18/2015, 1:57 PM

## 2015-10-18 NOTE — Progress Notes (Signed)
Md notified about pts pain level to right arm/chest, activated adult emergency standing orders, EKG, o2 and nitro tabs, BP elevated. MD saw pt. Ordered arm xray and heparin drip. Will continue to assess

## 2015-10-18 NOTE — Progress Notes (Signed)
ANTICOAGULATION CONSULT NOTE - Initial Consult  Pharmacy Consult for ACS/Chest pain Indication: chest pain/ACS  Allergies  Allergen Reactions  . Codeine Palpitations    Patient Measurements: Height: 6' (182.9 cm) Weight: 272 lb 11.2 oz (123.696 kg) IBW/kg (Calculated) : 77.6 Heparin Dosing Weight: 106  Vital Signs: Temp: 98.3 F (36.8 C) (06/30 2202) Temp Source: Oral (06/30 2202) BP: 142/109 mmHg (07/01 0820) Pulse Rate: 87 (07/01 0820)  Labs:  Recent Labs  10/17/15 1713 10/17/15 2109 10/18/15 0303 10/18/15 0837  HGB 13.8  --  13.6  --   HCT 41.0  --  38.9*  --   PLT 228  --  233  --   APTT 28  --   --   --   LABPROT 13.8  --   --   --   INR 1.04  --   --   --   CREATININE 1.85*  --  1.66*  --   TROPONINI 0.36* 0.27* 0.24* 0.21*    Estimated Creatinine Clearance: 71.5 mL/min (by C-G formula based on Cr of 1.66).   Medical History: Past Medical History  Diagnosis Date  . Hypertension     Assessment: Patient with chest pain/ACS, elevated troponin. Not on anticoagulation prior to admission. On subcutaneous heparin.  Goal of Therapy:  Heparin level 0.3-0.7 units/ml Monitor platelets by anticoagulation protocol: Yes   Plan:  Subcutaneous heparin discontinued. Last given at 0645. Will proceed with heparin bolus and drip.  Give 4000 units bolus x 1 Start heparin infusion at 1400 units/hr Check anti-Xa level in 6 hours and daily while on heparin Continue to monitor H&H and platelets  Davione Lenker C 10/18/2015,9:30 AM

## 2015-10-19 LAB — CBC
HCT: 38.1 % — ABNORMAL LOW (ref 40.0–52.0)
HEMOGLOBIN: 13 g/dL (ref 13.0–18.0)
MCH: 29.7 pg (ref 26.0–34.0)
MCHC: 34.1 g/dL (ref 32.0–36.0)
MCV: 87.3 fL (ref 80.0–100.0)
PLATELETS: 220 10*3/uL (ref 150–440)
RBC: 4.37 MIL/uL — AB (ref 4.40–5.90)
RDW: 13.6 % (ref 11.5–14.5)
WBC: 7.9 10*3/uL (ref 3.8–10.6)

## 2015-10-19 MED ORDER — FUROSEMIDE 40 MG PO TABS
40.0000 mg | ORAL_TABLET | Freq: Every day | ORAL | Status: DC
  Administered 2015-10-19 – 2015-10-20 (×2): 40 mg via ORAL
  Filled 2015-10-19 (×2): qty 1

## 2015-10-19 MED ORDER — HYDRALAZINE HCL 50 MG PO TABS
50.0000 mg | ORAL_TABLET | Freq: Three times a day (TID) | ORAL | Status: DC
  Administered 2015-10-19 – 2015-10-20 (×6): 50 mg via ORAL
  Filled 2015-10-19 (×6): qty 1

## 2015-10-19 NOTE — Progress Notes (Signed)
Patient ID: Trevor Nunez, male   DOB: April 14, 1966, 50 y.o.   MRN: [id number] [facility] [place] [facility]   PATIENT NAME: Trevor Senegalroy Profeta    MR#:  [id number]  DATE OF BIRTH:  April 14, 1966  SUBJECTIVE:  Patient presented with burning chest pain and right elbow pain on and off for last 4 days. Has diagnoses of hypertension however has been noncompliant with medications. Unable to lay down flat for last several weeks. Has bilateral leg swelling as well. No cp.   REVIEW OF SYSTEMS:   Review of Systems  Constitutional: Negative for fever, chills and weight loss.  HENT: Negative for ear discharge, ear pain and nosebleeds.   Eyes: Negative for blurred vision, pain and discharge.  Respiratory: Positive for shortness of breath. Negative for sputum production, wheezing and stridor.   Cardiovascular: Positive for chest pain. Negative for palpitations, orthopnea and PND.  Gastrointestinal: Negative for nausea, vomiting, abdominal pain and diarrhea.  Genitourinary: Negative for urgency and frequency.  Musculoskeletal: Positive for joint pain. Negative for back pain.  Neurological: Positive for weakness. Negative for sensory change, speech change and focal weakness.  Psychiatric/Behavioral: Negative for depression and hallucinations. The patient is not nervous/anxious.   All other systems reviewed and are negative.  Tolerating Diet:yes Tolerating PT:not needed   DRUG ALLERGIES:   Allergies  Allergen Reactions  . Codeine Palpitations    VITALS:  Blood pressure 143/95, pulse 70, temperature 98.5 F (36.9 C), temperature source Oral, resp. rate 18, height 6' (1.829 m), weight 124.694 kg (274 lb 14.4 oz), SpO2 95 %.  PHYSICAL EXAMINATION:   Physical Exam  GENERAL:  50 y.o.-year-old patient lying in the bed with no acute distress.obese  EYES: Pupils equal, round, reactive to light and accommodation. No scleral icterus. Extraocular muscles intact.  HEENT: Head  atraumatic, normocephalic. Oropharynx and nasopharynx clear.  NECK:  Supple, no jugular venous distention. No thyroid enlargement, no tenderness.  LUNGS: Normal breath sounds bilaterally, no wheezing, rales, rhonchi. No use of accessory muscles of respiration.  CARDIOVASCULAR: S1, S2 normal. No murmurs, rubs, or gallops.  ABDOMEN: Soft, nontender, nondistended. Bowel sounds present. No organomegaly or mass.  EXTREMITIES: No cyanosis, clubbing or edema b/l.   Right elbow decreased range of motion tenderness on palpation NEUROLOGIC: Cranial nerves II through XII are intact. No focal Motor or sensory deficits b/l.   PSYCHIATRIC:  patient is alert and oriented x 3.  SKIN: No obvious rash, lesion, or ulcer.   LABORATORY PANEL:  CBC  Recent Labs Lab 10/19/15 0420  WBC 7.9  HGB 13.0  HCT 38.1*  PLT 220    Chemistries   Recent Labs Lab 10/17/15 1713 10/18/15 0303  NA 141 140  K 3.4* 3.4*  CL 107 105  CO2 27 28  GLUCOSE 100* 109*  BUN 24* 21*  CREATININE 1.85* 1.66*  CALCIUM 8.8* 8.2*  AST 38  --   ALT 43  --   ALKPHOS 83  --   BILITOT 0.5  --    Cardiac Enzymes  Recent Labs Lab 10/18/15 1447  TROPONINI 0.18*   RADIOLOGY:  Dg Chest 2 View  10/17/2015  CLINICAL DATA:  Chest pain and short of breath EXAM: CHEST  2 VIEW COMPARISON:  None. FINDINGS: Heart size within normal limits. Prominent lung markings in the bases without focal infiltrate. Vascularity appears be within normal limits. No pleural effusion. Negative for mass or adenopathy. IMPRESSION: Prominent lung markings in the bases. This may be due to chronic  lung disease however no prior studies available to confirm. No definite pneumonia or heart failure. Electronically Signed   By: Marlan Palauharles  Clark M.D.   On: 10/17/2015 17:44   Dg Elbow Complete Right (3+view)  10/18/2015  CLINICAL DATA:  Right elbow pain and swelling with decreased range of motion. No reported injury. EXAM: RIGHT ELBOW - COMPLETE 3+ VIEW COMPARISON:   None. FINDINGS: Small right elbow joint effusion. No fracture, dislocation or suspicious focal osseous lesion. No evidence of degenerative or erosive arthropathy. Moderate right olecranon enthesophyte. Small enthesophyte at the ulnar side of the proximal right radial shaft. IMPRESSION: 1. Small right elbow joint effusion. No right elbow fracture detected. No malalignment. 2. Olecranon and proximal right radial shaft enthesophytes. Electronically Signed   By: Delbert PhenixJason A Poff M.D.   On: 10/18/2015 10:20   Ct Head Wo Contrast  10/17/2015  CLINICAL DATA:  Right arm weakness. Dizziness headache and hypertension EXAM: CT HEAD WITHOUT CONTRAST TECHNIQUE: Contiguous axial images were obtained from the base of the skull through the vertex without intravenous contrast. COMPARISON:  None. FINDINGS: Ventricle size normal.  Cerebral volume normal. Negative for acute or chronic infarction. Negative for hemorrhage or mass lesion. No shift of the midline structures. Negative calvarium. IMPRESSION: Negative Electronically Signed   By: Marlan Palauharles  Clark M.D.   On: 10/17/2015 17:46   ASSESSMENT AND PLAN:  Trevor Senegalroy Spangle is a 50 y.o. male who presents with Chest pain radiating to his right arm for the last 4-5 days, shortness of breath, orthopnea. Patient states this chest pain has been constant during this time, does get worse with exertion, and is associated with some nausea. He denies diaphoresis. He does state that he has had some headaches  1.Malignant HTN with elevated troponin (demenad ischemia according to cardiology) -  Patient's cardiac enzymes have been borderline elevated. He continues with chest pain. -IV heparin gtt d/ced -Cardiology consultation appreciated -By mouth aspirin, beta blockers, statins, sublingual nitroglycerin -Risk factors of untreated hypertension, strong family history of CAD, obesity, history of tobacco abuse  2. Accelerated hypertension - IV meds to lower his blood pressure, -On beta  blockers  3. Acute Diastolic CHF (congestive heart failure) (HCC) - unclear if it is potentially related to an acute ACS versus long-standing effects of his untreated hypertension. -echo noted -IV Lasix---change to po lasix  4. AKI (acute kidney injury) (HCC) - he needs some diuresis tonight, we will do this gently and monitor his kidney function closely. Avoid nephrotoxins  5. Suspected obstructive sleep apnea. -Patient will need sleep study as outpatient  6. Right elbow pain appears due to overuse of joint and likely underlying bursitis. -X-ray elbow shows effusion -Start patient on prednisone taper, heating pad, when necessary pain meds -Consider orthotic consult if symptoms are unimproved  Case discussed with Care Management/Social Worker. Management plans discussed with the patient, family and they are in agreement.  CODE STATUS: Full  DVT Prophylaxis: Heparin drip   TOTAL TIME TAKING CARE OF THIS PATIENT: 35 minutes.  >50% time spent on counselling and coordination of care  POSSIBLE D/C IN one to 2 DAYS, DEPENDING ON CLINICAL CONDITION.  Note: This dictation was prepared with Dragon dictation along with smaller phrase technology. Any transcriptional errors that result from this process are unintentional.  Kaitelyn Jamison M.D on 10/19/2015 at 10:34 AM  Between 7am to 6pm - Pager - [phone]  After 6pm go to [email or web address] - password [email or web address] [facility]  [facility]  Office  [phone]  CC: Primary care physician;  No PCP Per Patient

## 2015-10-20 ENCOUNTER — Encounter: Payer: Self-pay | Admitting: Internal Medicine

## 2015-10-20 LAB — GLUCOSE, CAPILLARY: Glucose-Capillary: 139 mg/dL — ABNORMAL HIGH (ref 65–99)

## 2015-10-20 MED ORDER — CLONIDINE HCL 0.1 MG PO TABS: 0.1000 mg | ORAL_TABLET | Freq: Every day | ORAL | Status: DC

## 2015-10-20 MED ORDER — HYDRALAZINE HCL 50 MG PO TABS: 50.0000 mg | ORAL_TABLET | Freq: Three times a day (TID) | ORAL | Status: DC

## 2015-10-20 MED ORDER — CLONIDINE HCL 0.1 MG PO TABS
0.1000 mg | ORAL_TABLET | Freq: Every day | ORAL | Status: DC
  Administered 2015-10-20: 0.1 mg via ORAL
  Filled 2015-10-20: qty 1

## 2015-10-20 MED ORDER — NITROGLYCERIN 0.4 MG SL SUBL: 0.4000 mg | SUBLINGUAL_TABLET | SUBLINGUAL | Status: DC | PRN

## 2015-10-20 MED ORDER — METOPROLOL TARTRATE 50 MG PO TABS
50.0000 mg | ORAL_TABLET | Freq: Two times a day (BID) | ORAL | Status: DC
  Administered 2015-10-20 (×2): 50 mg via ORAL
  Filled 2015-10-20 (×2): qty 1

## 2015-10-20 MED ORDER — HYDROCODONE-ACETAMINOPHEN 5-325 MG PO TABS: 1.0000 | ORAL_TABLET | ORAL | Status: DC | PRN

## 2015-10-20 MED ORDER — METOPROLOL TARTRATE 50 MG PO TABS: 50.0000 mg | ORAL_TABLET | Freq: Two times a day (BID) | ORAL | Status: DC

## 2015-10-20 MED ORDER — FUROSEMIDE 40 MG PO TABS: 40.0000 mg | ORAL_TABLET | Freq: Every day | ORAL | Status: DC

## 2015-10-20 MED ORDER — CLONIDINE HCL 0.1 MG PO TABS: 0.1000 mg | ORAL_TABLET | Freq: Two times a day (BID) | ORAL | Status: DC

## 2015-10-20 MED ORDER — PREDNISONE 10 MG PO TABS: ORAL_TABLET | ORAL | Status: DC

## 2015-10-20 NOTE — Care Management (Signed)
Met with patient to discuss discharge planning. Patient lives at home with a room mate. He works at [employer]. He has no insurance or PCP. Appointment has been scheduled with the CHF clinic. Applications given for medication management and open door clinic. Email sent to Bonnee Quin for follow up at [facility]. Reviewed all discharge medications and they are on the $4 [employer] List as well. Patient voices no other concerns.

## 2015-10-20 NOTE — Clinical Documentation Improvement (Addendum)
Cardiology Internal Medicine  Can the diagnosis of CKD be further specified? Please document findings in next progress note. Thank you!  CKD-III     CKD Stage I - GFR greater than or equal to 90  CKD Stage II - GFR 60-89  CKD Stage III - GFR 30-59  CKD Stage IV - GFR 15-29  CKD Stage V - GFR < 15  ESRD (End Stage Renal Disease)  Other condition  Unable to clinically determine  Supporting Information: : (risk factors, signs and symptoms, diagnostics, treatment)  chronic renal failure  Likely secondary to long-standing poorly controlled hypertension  Stressed importance of blood pressure control with the patient in an effort to avoid &hemodialysis later in life   Black male  GFR's for current admission are running from 47 to 54  Please exercise your independent, professional judgment when responding. A specific answer is not anticipated or expected.  Thank You, Ruthine DoseEileen Warrington RN, BSN, CCDS Clinical Documentation Specialist [facility] [phone]; cell [phone]

## 2015-10-20 NOTE — Discharge Instructions (Signed)
Heart Failure Clinic appointment on November 03, 2015 at 11:30am with Trevor Kindredina Hackney, FNP. Please call [phone] to reschedule.   GET SLEEP STUDY DONE AS OUT PT

## 2015-10-20 NOTE — Progress Notes (Signed)
Initial Heart Failure Clinic appointment scheduled on November 03, 2015 at 11:30am. Thank you.

## 2015-10-20 NOTE — Progress Notes (Signed)
Patient ID: Trevor Nunez, male   DOB: 02/19/1966, 50 y.o.   MRN: [id number] [facility] [place] [facility]   PATIENT NAME: Trevor Nunez    MR#:  [id number]  DATE OF BIRTH:  02/19/1966  DATE OF ADMISSION:  10/17/2015 ADMITTING PHYSICIAN: Oralia Manisavid Willis, MD  DATE OF DISCHARGE: 10/20/15  PRIMARY CARE PHYSICIAN: No PCP Per Patient    ADMISSION DIAGNOSIS:  Hypertensive urgency [I16.0] Chest pain, unspecified chest pain type [R07.9] Acute congestive heart failure, unspecified congestive heart failure type (HCC) [I50.9]  DISCHARGE DIAGNOSIS:  Malignant HTN Acute on chronic Diastolic CHF Right elbow pain  CKD-III,chronic Suspected sleep apnea  SECONDARY DIAGNOSIS:   Past Medical History  Diagnosis Date  . Hypertension     HOSPITAL COURSE:  Trevor Senegalroy Peet is a 50 y.o. male who presents with Chest pain radiating to his right arm for the last 4-5 days, shortness of breath, orthopnea. Patient states this chest pain has been constant during this time, does get worse with exertion, and is associated with some nausea. He denies diaphoresis. He does state that he has had some headaches  1.Malignant HTN with elevated troponin (demenad ischemia according to cardiology) - Patient's cardiac enzymes have been borderline elevated. He continues with chest pain. -IV heparin gtt d/ced -Cardiology consultation appreciated -By mouth aspirin, beta blockers, statins, sublingual nitroglycerin, clonidine and hydralazine -Risk factors of untreated hypertension, strong family history of CAD, obesity, history of tobacco abuse  2.  Right elbow pain appears due to overuse of joint and likely underlying bursitis. -X-ray elbow shows effusion -Start patient on prednisone taper, heating pad, when necessary pain meds -Consider orthotic consult if symptoms are unimproved -pt advised not to take ibuprofen  3. Acute on chronic Diastolic CHF (congestive heart failure) (HCC) - unclear if it  is potentially related to an acute ACS versus long-standing effects of his untreated hypertension. -echo noted -IV Lasix---change to po lasix -sats 98% on RA and ambulation  4. AKI (acute kidney injury) on CKD-III -avoid nephrotoxins -pt will benefit from nephrology f/u as oupt  5. Suspected obstructive sleep apnea. -Patient will need sleep study as outpatient -will try to schedule it  D/c home  CONSULTS OBTAINED:  Treatment Team:  Antonieta Ibaimothy J Gollan, MD  DRUG ALLERGIES:   Allergies  Allergen Reactions  . Codeine Palpitations    DISCHARGE MEDICATIONS:   Current Discharge Medication List    START taking these medications   Details  cloNIDine (CATAPRES) 0.1 MG tablet Take 1 tablet (0.1 mg total) by mouth daily. Qty: 60 tablet, Refills: 11    furosemide (LASIX) 40 MG tablet Take 1 tablet (40 mg total) by mouth daily. Qty: 30 tablet, Refills: 1    hydrALAZINE (APRESOLINE) 50 MG tablet Take 1 tablet (50 mg total) by mouth every 8 (eight) hours. Qty: 90 tablet, Refills: 1    HYDROcodone-acetaminophen (NORCO/VICODIN) 5-325 MG tablet Take 1-2 tablets by mouth every 4 (four) hours as needed for moderate pain. Qty: 30 tablet, Refills: 0    metoprolol (LOPRESSOR) 50 MG tablet Take 1 tablet (50 mg total) by mouth 2 (two) times daily. Qty: 60 tablet, Refills: 1    nitroGLYCERIN (NITROSTAT) 0.4 MG SL tablet Place 1 tablet (0.4 mg total) under the tongue every 5 (five) minutes as needed for chest pain. Qty: 20 tablet, Refills: 1    predniSONE (DELTASONE) 10 MG tablet Taper by 10 mg daily then stop Qty: 15 tablet, Refills: 0      STOP taking these  medications     ibuprofen (ADVIL,MOTRIN) 200 MG tablet         If you experience worsening of your admission symptoms, develop shortness of breath, life threatening emergency, suicidal or homicidal thoughts you must seek medical attention immediately by calling 911 or calling your MD immediately  if symptoms less severe.  You  Must read complete instructions/literature along with all the possible adverse reactions/side effects for all the Medicines you take and that have been prescribed to you. Take any new Medicines after you have completely understood and accept all the possible adverse reactions/side effects.   Please note  You were cared for by a hospitalist during your hospital stay. If you have any questions about your discharge medications or the care you received while you were in the hospital after you are discharged, you can call the unit and asked to speak with the hospitalist on call if the hospitalist that took care of you is not available. Once you are discharged, your primary care physician will handle any further medical issues. Please note that NO REFILLS for any discharge medications will be authorized once you are discharged, as it is imperative that you return to your primary care physician (or establish a relationship with a primary care physician if you do not have one) for your aftercare needs so that they can reassess your need for medications and monitor your lab values. Today   SUBJECTIVE   Right elbow pain improving slowly. No sob  VITAL SIGNS:  Blood pressure 154/100, pulse 72, temperature 98.2 F (36.8 C), temperature source Oral, resp. rate 18, height 6' (1.829 m), weight 124.966 kg (275 lb 8 oz), SpO2 95 %.  I/O:    Intake/Output Summary (Last 24 hours) at 10/20/15 0845 Last data filed at 10/20/15 0606  Gross per 24 hour  Intake    600 ml  Output   1700 ml  Net  -1100 ml    PHYSICAL EXAMINATION:  GENERAL:  50 y.o.-year-old patient lying in the bed with no acute distress.  EYES: Pupils equal, round, reactive to light and accommodation. No scleral icterus. Extraocular muscles intact.  HEENT: Head atraumatic, normocephalic. Oropharynx and nasopharynx clear.  NECK:  Supple, no jugular venous distention. No thyroid enlargement, no tenderness.  LUNGS: Normal breath sounds  bilaterally, no wheezing, rales,rhonchi or crepitation. No use of accessory muscles of respiration.  CARDIOVASCULAR: S1, S2 normal. No murmurs, rubs, or gallops.  ABDOMEN: Soft, non-tender, non-distended. Bowel sounds present. No organomegaly or mass.  EXTREMITIES: No pedal edema, cyanosis, or clubbing. Right elbow decreased ROM+ NEUROLOGIC: Cranial nerves II through XII are intact. Muscle strength 5/5 in all extremities. Sensation intact. Gait not checked.  PSYCHIATRIC: The patient is alert and oriented x 3.  SKIN: No obvious rash, lesion, or ulcer.   DATA REVIEW:   CBC   Recent Labs Lab 10/19/15 0420  WBC 7.9  HGB 13.0  HCT 38.1*  PLT 220    Chemistries   Recent Labs Lab 10/17/15 1713 10/18/15 0303  NA 141 140  K 3.4* 3.4*  CL 107 105  CO2 27 28  GLUCOSE 100* 109*  BUN 24* 21*  CREATININE 1.85* 1.66*  CALCIUM 8.8* 8.2*  AST 38  --   ALT 43  --   ALKPHOS 83  --   BILITOT 0.5  --     Microbiology Results   No results found for this or any previous visit (from the past 240 hour(s)).  RADIOLOGY:  Dg Elbow Complete Right (  3+view)  10/18/2015  CLINICAL DATA:  Right elbow pain and swelling with decreased range of motion. No reported injury. EXAM: RIGHT ELBOW - COMPLETE 3+ VIEW COMPARISON:  None. FINDINGS: Small right elbow joint effusion. No fracture, dislocation or suspicious focal osseous lesion. No evidence of degenerative or erosive arthropathy. Moderate right olecranon enthesophyte. Small enthesophyte at the ulnar side of the proximal right radial shaft. IMPRESSION: 1. Small right elbow joint effusion. No right elbow fracture detected. No malalignment. 2. Olecranon and proximal right radial shaft enthesophytes. Electronically Signed   By: Delbert Phenix M.D.   On: 10/18/2015 10:20     Management plans discussed with the patient, family and they are in agreement.  CODE STATUS:     Code Status Orders        Start     Ordered   10/17/15 2205  Full code    Continuous     10/17/15 2207    Code Status History    Date Active Date Inactive Code Status Order ID Comments User Context   This patient has a current code status but no historical code status.    Advance Directive Documentation        Most Recent Value   Type of Advance Directive  Living will   Pre-existing out of facility DNR order (yellow form or pink MOST form)     "MOST" Form in Place?        TOTAL TIME TAKING CARE OF THIS PATIENT: 40 minutes.    Heaven Meeker M.D on 10/20/2015 at 8:45 AM  Between 7am to 6pm - Pager - [phone] After 6pm go to [email or web address] - password [email or web address] [facility]  [facility]  Office  [phone]  CC: Primary care physician; No PCP Per Patient

## 2015-10-20 NOTE — Plan of Care (Signed)
Problem: Food- and Nutrition-Related Knowledge Deficit (NB-1.1) Goal: Nutrition education Formal process to instruct or train a patient/client in a skill or to impart knowledge to help patients/clients voluntarily manage or modify food choices and eating behavior to maintain or improve health. Outcome: Adequate for Discharge Nutrition Education Note  RD consulted for nutrition education regarding CHF/HTN.  RD provided "Heart Failure Nutrition Therapy" handout from the Academy of [employer] and Dietetics. Reviewed patient's dietary recall. Provided examples on ways to decrease sodium intake in diet. Discouraged intake of processed foods and use of salt shaker. Encouraged fresh fruits and vegetables as well as whole grain sources of carbohydrates to maximize fiber intake. Also provided pt with "Heart Healthy Nutrition Therapy" and reviewed key points.   RD discussed why it is important for patient to adhere to diet recommendations, and emphasized the role of fluids, foods to avoid, and importance of weighing self daily. Teach back method used.  Expect fair compliance.  Body mass index is 37.36 kg/(m^2). Pt meets criteria for Obesity Unspecified based on current BMI.  Pt with discharge for today; tolerating diet with good appetite. No further interventions at this time  Romelle StarcherCate Shemekia Patane MS, RD, LDN [phone] Pager  [phone] Weekend/On-Call Pager

## 2015-10-21 ENCOUNTER — Encounter: Payer: Self-pay | Admitting: Internal Medicine

## 2015-10-21 NOTE — Progress Notes (Addendum)
Patient after visit summary was explained to him. He indicated his discharge instructions. PIV  Cardiac monitor were  Removed. Patient was discharged from the unit at [facility]0030 10/21/2015.

## 2015-10-21 NOTE — Discharge Summary (Signed)
Patient ID: Trevor Nunez, male DOB: May 10, 1965, 50 y.o. MRN: [id number] [facility] -  at [facility]   PATIENT NAME: Trevor Nunez   MR#: [id number]  DATE OF BIRTH: May 10, 1965  DATE OF ADMISSION: 6/30/2017ADMITTING PHYSICIAN: Oralia Manisavid Willis, MD  DATE OF DISCHARGE: 10/20/15  PRIMARY CARE PHYSICIAN: No PCP Per Patient    Hypertensive urgency [I16.0] Chest pain, unspecified chest pain type [R07.9] Acute congestive heart failure, unspecified congestive heart failure type (HCC) [I50.9]  DISCHARGE DIAGNOSIS:  Malignant HTN Acute on chronic Diastolic CHF Right elbow pain  CKD-III,chronic Suspected sleep apnea   Past Medical History  Diagnosis Date  . Hypertension    HOSPITAL COURSE:  Trevor Nunez is a 50 y.o. male who presents with Chest pain radiating to his right arm for the last 4-5 days, shortness of breath, orthopnea. Patient states this chest pain has been constant during this time, does get worse with exertion, and is associated with some nausea. He denies diaphoresis. He does state that he has had some headaches  1.Malignant HTN with elevated troponin (demenad ischemia according to cardiology) - Patient's cardiac enzymes have been borderline elevated. He continues with chest pain. -IV heparin gtt d/ced -Cardiology consultation appreciated -By mouth aspirin, beta blockers, statins, sublingual nitroglycerin, clonidine and hydralazine -Risk factors of untreated hypertension, strong family history of CAD, obesity, history of tobacco abuse  2. Right elbow pain appears due to overuse of joint and likely underlying bursitis. -X-ray elbow shows effusion -Start patient on prednisone taper, heating pad, when necessary pain meds -Consider orthotic consult if symptoms are unimproved -pt advised not to take ibuprofen  3. Acute on chronic Diastolic CHF (congestive heart failure) (HCC) - unclear if it is potentially related to  an acute ACS versus long-standing effects of his untreated hypertension. -echo noted -IV Lasix---change to po lasix -sats 98% on RA and ambulation  4. AKI (acute kidney injury) on CKD-III -avoid nephrotoxins -pt will benefit from nephrology f/u as oupt  5. Suspected obstructive sleep apnea. -Patient will need sleep study as outpatient -will try to schedule it  D/c home  CONSULTS: Treatment Team: Antonieta Ibaimothy J Gollan, MD   Allergies  Allergen Reactions  . Codeine Palpitations     Current Discharge Medication List    START taking these medications   Details  cloNIDine (CATAPRES) 0.1 MG tablet Take 1 tablet (0.1 mg total) by mouth daily. Qty: 60 tablet, Refills: 11    furosemide (LASIX) 40 MG tablet Take 1 tablet (40 mg total) by mouth daily. Qty: 30 tablet, Refills: 1    hydrALAZINE (APRESOLINE) 50 MG tablet Take 1 tablet (50 mg total) by mouth every 8 (eight) hours. Qty: 90 tablet, Refills: 1    HYDROcodone-acetaminophen (NORCO/VICODIN) 5-325 MG tablet Take 1-2 tablets by mouth every 4 (four) hours as needed for moderate pain. Qty: 30 tablet, Refills: 0    metoprolol (LOPRESSOR) 50 MG tablet Take 1 tablet (50 mg total) by mouth 2 (two) times daily. Qty: 60 tablet, Refills: 1    nitroGLYCERIN (NITROSTAT) 0.4 MG SL tablet Place 1 tablet (0.4 mg total) under the tongue every 5 (five) minutes as needed for chest pain. Qty: 20 tablet, Refills: 1    predniSONE (DELTASONE) 10 MG tablet Taper by 10 mg daily then stop Qty: 15 tablet, Refills: 0      STOP taking these medications     ibuprofen (ADVIL,MOTRIN) 200 MG tablet         If you experience worsening of your admission symptoms,  develop shortness of breath, life threatening emergency, suicidal or homicidal thoughts you must seek medical attention immediately by calling 911 or calling your MD immediately if symptoms less severe.  You Must read complete  instructions/literature along with all the possible adverse reactions/side effects for all the Medicines you take and that have been prescribed to you. Take any new Medicines after you have completely understood and accept all the possible adverse reactions/side effects.   Please note  You were cared for by a hospitalist during your hospital stay. If you have any questions about your discharge medications or the care you received while you were in the hospital after you are discharged, you can call the unit and asked to speak with the hospitalist on call if the hospitalist that took care of you is not available. Once you are discharged, your primary care physician will handle any further medical issues. Please note that NO REFILLS for any discharge medications will be authorized once you are discharged, as it is imperative that you return to your primary care physician (or establish a relationship with a primary care physician if you do not have one) for your aftercare needs so that they can reassess your need for medications and monitor your lab values. Today   CHIEF COMPLAINTS: Right elbow pain improving slowly. No sob  VITALS: Blood pressure 154/100, pulse 72, temperature 98.2 F (36.8 C), temperature source Oral, resp. rate 18, height 6' (1.829 m), weight 124.966 kg (275 lb 8 oz), SpO2 95 %.  PHYSICAL EXAM: GENERAL: 50 y.o.-year-old patient lying in the bed with no acute distress.  EYES: Pupils equal, round, reactive to light and accommodation. No scleral icterus. Extraocular muscles intact.  HEENT: Head atraumatic, normocephalic. Oropharynx and nasopharynx clear.  NECK: Supple, no jugular venous distention. No thyroid enlargement, no tenderness.  LUNGS: Normal breath sounds bilaterally, no wheezing, rales,rhonchi or crepitation. No use of accessory muscles of respiration.  CARDIOVASCULAR: S1, S2 normal. No murmurs, rubs, or gallops.  ABDOMEN: Soft, non-tender,  non-distended. Bowel sounds present. No organomegaly or mass.  EXTREMITIES: No pedal edema, cyanosis, or clubbing. Right elbow decreased ROM+ NEUROLOGIC: Cranial nerves II through XII are intact. Muscle strength 5/5 in all extremities. Sensation intact. Gait not checked.  PSYCHIATRIC: The patient is alert and oriented x 3.  SKIN: No obvious rash, lesion, or ulcer.    CBC   Last Labs      Recent Labs Lab 10/19/15 0420  WBC 7.9  HGB 13.0  HCT 38.1*  PLT 220      Chemistries   Last Labs      Recent Labs Lab 10/17/15 1713 10/18/15 0303  NA 141 140  K 3.4* 3.4*  CL 107 105  CO2 27 28  GLUCOSE 100* 109*  BUN 24* 21*  CREATININE 1.85* 1.66*  CALCIUM 8.8* 8.2*  AST 38 --   ALT 43 --   ALKPHOS 83 --   BILITOT 0.5 --             Management plans discussed with the patient, family and they are in agreement.  CODE STATUS:     Code Status Orders        Start   Ordered   10/17/15 2205  Full code Continuous    10/17/15 2207    Code Status History    Date Active Date Inactive Code Status Order ID Comments User Context   This patient has a current code status but no historical code status.    Advance Directive  Documentation      Most Recent Value   Type of Advance Directive  Living will   Pre-existing out of facility DNR order (yellow form or pink MOST form)     "MOST" Form in Place?        TOTAL TIME TAKING CARE OF THIS PATIENT: 40 minutes.    Reaghan Kawa M.D on 10/21/2015   Between 7am to 6pm - Pager - [phone] After 6pm go to [email or web address] - Scientist, research (life sciences)password [email or web address]  [facility]  Office [phone]

## 2015-11-03 ENCOUNTER — Telehealth: Payer: Self-pay | Admitting: Family

## 2015-11-03 ENCOUNTER — Ambulatory Visit: Payer: Self-pay | Admitting: Family

## 2015-11-03 NOTE — Telephone Encounter (Signed)
Patient did not show for his initial appointment at [facility] on 11/03/15. Will attempt to reschedule.

## 2015-11-19 ENCOUNTER — Encounter: Payer: Self-pay | Admitting: *Deleted

## 2015-11-19 ENCOUNTER — Encounter: Payer: Self-pay | Admitting: Nurse Practitioner

## 2016-06-14 ENCOUNTER — Inpatient Hospital Stay
Admission: EM | Admit: 2016-06-14 | Discharge: 2016-06-17 | DRG: 291 | Disposition: A | Discharge status: Home / Self Care | Payer: Self-pay | Attending: Internal Medicine | Admitting: Internal Medicine

## 2016-06-14 ENCOUNTER — Emergency Department: Payer: Self-pay

## 2016-06-14 DIAGNOSIS — I160 Hypertensive urgency: Secondary | ICD-10-CM | POA: Diagnosis present

## 2016-06-14 DIAGNOSIS — Z823 Family history of stroke: Secondary | ICD-10-CM

## 2016-06-14 DIAGNOSIS — I248 Other forms of acute ischemic heart disease: Secondary | ICD-10-CM | POA: Diagnosis present

## 2016-06-14 DIAGNOSIS — Z79891 Long term (current) use of opiate analgesic: Secondary | ICD-10-CM

## 2016-06-14 DIAGNOSIS — R778 Other specified abnormalities of plasma proteins: Secondary | ICD-10-CM

## 2016-06-14 DIAGNOSIS — N183 Chronic kidney disease, stage 3 unspecified: Secondary | ICD-10-CM

## 2016-06-14 DIAGNOSIS — I444 Left anterior fascicular block: Secondary | ICD-10-CM | POA: Diagnosis present

## 2016-06-14 DIAGNOSIS — Z9119 Patient's noncompliance with other medical treatment and regimen: Secondary | ICD-10-CM

## 2016-06-14 DIAGNOSIS — Z885 Allergy status to narcotic agent status: Secondary | ICD-10-CM

## 2016-06-14 DIAGNOSIS — Z720 Tobacco use: Secondary | ICD-10-CM

## 2016-06-14 DIAGNOSIS — J069 Acute upper respiratory infection, unspecified: Secondary | ICD-10-CM | POA: Diagnosis present

## 2016-06-14 DIAGNOSIS — Z79899 Other long term (current) drug therapy: Secondary | ICD-10-CM

## 2016-06-14 DIAGNOSIS — Z9114 Patient's other noncompliance with medication regimen: Secondary | ICD-10-CM

## 2016-06-14 DIAGNOSIS — Z9049 Acquired absence of other specified parts of digestive tract: Secondary | ICD-10-CM

## 2016-06-14 DIAGNOSIS — R7989 Other specified abnormal findings of blood chemistry: Secondary | ICD-10-CM

## 2016-06-14 DIAGNOSIS — E876 Hypokalemia: Secondary | ICD-10-CM | POA: Diagnosis present

## 2016-06-14 DIAGNOSIS — Z87891 Personal history of nicotine dependence: Secondary | ICD-10-CM

## 2016-06-14 DIAGNOSIS — Z8249 Family history of ischemic heart disease and other diseases of the circulatory system: Secondary | ICD-10-CM

## 2016-06-14 DIAGNOSIS — I130 Hypertensive heart and chronic kidney disease with heart failure and stage 1 through stage 4 chronic kidney disease, or unspecified chronic kidney disease: Principal | ICD-10-CM | POA: Diagnosis present

## 2016-06-14 DIAGNOSIS — I5033 Acute on chronic diastolic (congestive) heart failure: Secondary | ICD-10-CM | POA: Diagnosis present

## 2016-06-14 DIAGNOSIS — I10 Essential (primary) hypertension: Secondary | ICD-10-CM | POA: Diagnosis present

## 2016-06-14 DIAGNOSIS — R079 Chest pain, unspecified: Secondary | ICD-10-CM

## 2016-06-14 HISTORY — DX: Chronic kidney disease, stage 3 (moderate): N18.3

## 2016-06-14 HISTORY — DX: Patient's other noncompliance with medication regimen: Z91.14

## 2016-06-14 HISTORY — DX: Patient's other noncompliance with medication regimen for other reason: Z91.148

## 2016-06-14 HISTORY — DX: Chronic diastolic (congestive) heart failure: I50.32

## 2016-06-14 HISTORY — DX: Chronic kidney disease, stage 3 unspecified: N18.30

## 2016-06-14 LAB — BASIC METABOLIC PANEL
ANION GAP: 6 (ref 5–15)
BUN: 25 mg/dL — ABNORMAL HIGH (ref 6–20)
CALCIUM: 8.5 mg/dL — AB (ref 8.9–10.3)
CO2: 29 mmol/L (ref 22–32)
Chloride: 106 mmol/L (ref 101–111)
Creatinine, Ser: 1.82 mg/dL — ABNORMAL HIGH (ref 0.61–1.24)
GFR calc Af Amer: 48 mL/min — ABNORMAL LOW (ref >60)
GFR calc non Af Amer: 41 mL/min — ABNORMAL LOW (ref >60)
GLUCOSE: 88 mg/dL (ref 65–99)
Potassium: 3.2 mmol/L — ABNORMAL LOW (ref 3.5–5.1)
Sodium: 141 mmol/L (ref 135–145)

## 2016-06-14 LAB — CBC
HEMATOCRIT: 43.2 % (ref 40.0–52.0)
HEMOGLOBIN: 15 g/dL (ref 13.0–18.0)
MCH: 30 pg (ref 26.0–34.0)
MCHC: 34.8 g/dL (ref 32.0–36.0)
MCV: 86.2 fL (ref 80.0–100.0)
Platelets: 207 10*3/uL (ref 150–440)
RBC: 5.01 MIL/uL (ref 4.40–5.90)
RDW: 13.7 % (ref 11.5–14.5)
WBC: 5.1 10*3/uL (ref 3.8–10.6)

## 2016-06-14 LAB — GLUCOSE, CAPILLARY: Glucose-Capillary: 126 mg/dL — ABNORMAL HIGH (ref 65–99)

## 2016-06-14 LAB — PROTIME-INR
INR: 0.98
Prothrombin Time: 13 seconds (ref 11.4–15.2)

## 2016-06-14 LAB — MRSA PCR SCREENING: MRSA by PCR: NEGATIVE

## 2016-06-14 LAB — TROPONIN I
TROPONIN I: 0.11 ng/mL — AB (ref <0.03)
TROPONIN I: 0.09 ng/mL — AB (ref <0.03)

## 2016-06-14 LAB — BRAIN NATRIURETIC PEPTIDE: B Natriuretic Peptide: 169 pg/mL — ABNORMAL HIGH (ref 0.0–100.0)

## 2016-06-14 LAB — HEPARIN LEVEL (UNFRACTIONATED): HEPARIN UNFRACTIONATED: 0.47 [IU]/mL (ref 0.30–0.70)

## 2016-06-14 LAB — APTT: APTT: 28 s (ref 24–36)

## 2016-06-14 MED ORDER — ACETAMINOPHEN 325 MG PO TABS
650.0000 mg | ORAL_TABLET | ORAL | Status: DC | PRN
  Administered 2016-06-15 – 2016-06-17 (×2): 650 mg via ORAL
  Filled 2016-06-14 (×2): qty 2

## 2016-06-14 MED ORDER — FUROSEMIDE 10 MG/ML IJ SOLN
40.0000 mg | Freq: Once | INTRAMUSCULAR | Status: AC
  Administered 2016-06-14: 40 mg via INTRAVENOUS
  Filled 2016-06-14: qty 4

## 2016-06-14 MED ORDER — HEPARIN BOLUS VIA INFUSION
4000.0000 [IU] | Freq: Once | INTRAVENOUS | Status: AC
  Administered 2016-06-14: 4000 [IU] via INTRAVENOUS
  Filled 2016-06-14: qty 4000

## 2016-06-14 MED ORDER — HYDRALAZINE HCL 50 MG PO TABS
50.0000 mg | ORAL_TABLET | Freq: Three times a day (TID) | ORAL | Status: DC
  Administered 2016-06-14 – 2016-06-15 (×2): 50 mg via ORAL
  Filled 2016-06-14 (×2): qty 1

## 2016-06-14 MED ORDER — ONDANSETRON HCL 4 MG/2ML IJ SOLN: 4.0000 mg | Freq: Four times a day (QID) | INTRAMUSCULAR | Status: DC | PRN

## 2016-06-14 MED ORDER — SODIUM CHLORIDE 0.9% FLUSH
3.0000 mL | Freq: Two times a day (BID) | INTRAVENOUS | Status: DC
  Administered 2016-06-14 – 2016-06-17 (×6): 3 mL via INTRAVENOUS

## 2016-06-14 MED ORDER — LABETALOL HCL 5 MG/ML IV SOLN
5.0000 mg | Freq: Once | INTRAVENOUS | Status: AC
  Administered 2016-06-14: 5 mg via INTRAVENOUS
  Filled 2016-06-14: qty 4

## 2016-06-14 MED ORDER — HEPARIN (PORCINE) IN NACL 100-0.45 UNIT/ML-% IJ SOLN
1400.0000 [IU]/h | INTRAMUSCULAR | Status: DC
  Administered 2016-06-14 – 2016-06-15 (×2): 1400 [IU]/h via INTRAVENOUS
  Filled 2016-06-14 (×3): qty 250

## 2016-06-14 MED ORDER — CLOPIDOGREL BISULFATE 75 MG PO TABS
300.0000 mg | ORAL_TABLET | Freq: Once | ORAL | Status: AC
  Administered 2016-06-14: 300 mg via ORAL
  Filled 2016-06-14: qty 4

## 2016-06-14 MED ORDER — ASPIRIN 300 MG RE SUPP: 300.0000 mg | RECTAL | Status: AC

## 2016-06-14 MED ORDER — ATORVASTATIN CALCIUM 20 MG PO TABS
40.0000 mg | ORAL_TABLET | Freq: Every day | ORAL | Status: DC
  Administered 2016-06-14 – 2016-06-16 (×3): 40 mg via ORAL
  Filled 2016-06-14 (×3): qty 2

## 2016-06-14 MED ORDER — MORPHINE SULFATE (PF) 2 MG/ML IV SOLN
2.0000 mg | INTRAVENOUS | Status: DC | PRN
  Filled 2016-06-14: qty 1

## 2016-06-14 MED ORDER — METOPROLOL TARTRATE 50 MG PO TABS
50.0000 mg | ORAL_TABLET | Freq: Two times a day (BID) | ORAL | Status: DC
  Administered 2016-06-14 – 2016-06-17 (×6): 50 mg via ORAL
  Filled 2016-06-14 (×6): qty 1

## 2016-06-14 MED ORDER — FENTANYL CITRATE (PF) 100 MCG/2ML IJ SOLN
50.0000 ug | INTRAMUSCULAR | Status: DC | PRN
  Administered 2016-06-14 – 2016-06-16 (×4): 50 ug via INTRAVENOUS
  Filled 2016-06-14 (×4): qty 2

## 2016-06-14 MED ORDER — SODIUM CHLORIDE 0.9% FLUSH
3.0000 mL | INTRAVENOUS | Status: DC | PRN
Start: 2016-06-14 — End: 2016-06-17

## 2016-06-14 MED ORDER — NITROGLYCERIN 0.4 MG SL SUBL
0.4000 mg | SUBLINGUAL_TABLET | SUBLINGUAL | Status: DC | PRN
  Administered 2016-06-14 – 2016-06-16 (×4): 0.4 mg via SUBLINGUAL
  Filled 2016-06-14 (×6): qty 1

## 2016-06-14 MED ORDER — ASPIRIN EC 81 MG PO TBEC
81.0000 mg | DELAYED_RELEASE_TABLET | Freq: Every day | ORAL | Status: DC
  Administered 2016-06-15 – 2016-06-17 (×3): 81 mg via ORAL
  Filled 2016-06-14 (×3): qty 1

## 2016-06-14 MED ORDER — HYDRALAZINE HCL 20 MG/ML IJ SOLN
10.0000 mg | INTRAMUSCULAR | Status: DC | PRN
  Administered 2016-06-14 – 2016-06-15 (×3): 10 mg via INTRAVENOUS
  Filled 2016-06-14 (×4): qty 1

## 2016-06-14 MED ORDER — FUROSEMIDE 10 MG/ML IJ SOLN
40.0000 mg | Freq: Two times a day (BID) | INTRAMUSCULAR | Status: DC
  Administered 2016-06-15 (×2): 40 mg via INTRAVENOUS
  Filled 2016-06-14 (×2): qty 4

## 2016-06-14 MED ORDER — SODIUM CHLORIDE 0.9 % IV SOLN: 250.0000 mL | INTRAVENOUS | Status: DC | PRN

## 2016-06-14 MED ORDER — POTASSIUM CHLORIDE CRYS ER 20 MEQ PO TBCR: 40.0000 meq | EXTENDED_RELEASE_TABLET | Freq: Once | ORAL | Status: DC

## 2016-06-14 MED ORDER — NITROGLYCERIN 2 % TD OINT
0.5000 [in_us] | TOPICAL_OINTMENT | Freq: Once | TRANSDERMAL | Status: AC
  Administered 2016-06-14: 0.5 [in_us] via TOPICAL
  Filled 2016-06-14: qty 1

## 2016-06-14 MED ORDER — ASPIRIN 81 MG PO CHEW
324.0000 mg | CHEWABLE_TABLET | ORAL | Status: AC
  Administered 2016-06-14: 324 mg via ORAL
  Filled 2016-06-14: qty 4

## 2016-06-14 MED ORDER — NITROGLYCERIN 0.4 MG SL SUBL: 0.4000 mg | SUBLINGUAL_TABLET | SUBLINGUAL | Status: DC | PRN

## 2016-06-14 NOTE — ED Provider Notes (Signed)
[facility] Emergency Department Provider Note    First MD Initiated Contact with Patient 06/14/16 1350     (approximate)  I have reviewed the triage vital signs and the nursing notes.   HISTORY  Chief Complaint Chest Pain    HPI Trevor Nunez is a 51 y.o. male who presents with pain across his chest radiating to his right shoulder for 3 days associated with shortness of breath and orthopnea since Friday. States is also having elevated blood pressure. No numbness or tingling. Denies any chest pain ripping or tearing through to his back. No leg pain. States he is having worsening lower extremity swelling. He has been taking his medications as directed. States that yesterday while working around he did become diaphoretic and this lasted roughly 1 hour but has not had any of his symptoms today. Currently rates his pain as 5 out of 10 in severity.   Past Medical History:  Diagnosis Date  . CHF (congestive heart failure) (HCC)   . Hypertension    Family History  Problem Relation Age of Onset  . Hypertension Mother    Past Surgical History:  Procedure Laterality Date  . CHOLECYSTECTOMY    . HERNIA REPAIR     Patient Active Problem List   Diagnosis Date Noted  . Acute congestive heart failure (HCC)   . Pain and swelling of elbow   . Acute on chronic diastolic CHF (congestive heart failure) (HCC)   . H/O medication noncompliance   . Chest pain 10/17/2015  . Accelerated hypertension 10/17/2015  . Acute systolic CHF (congestive heart failure) (HCC) 10/17/2015  . AKI (acute kidney injury) (HCC) 10/17/2015      Prior to Admission medications   Medication Sig Start Date End Date Taking? Authorizing Provider  cloNIDine (CATAPRES) 0.1 MG tablet Take 1 tablet (0.1 mg total) by mouth daily. 10/20/15  Yes Enedina Finner, MD  furosemide (LASIX) 40 MG tablet Take 1 tablet (40 mg total) by mouth daily. 10/20/15  Yes Enedina Finner, MD  hydrALAZINE (APRESOLINE) 50 MG tablet  Take 1 tablet (50 mg total) by mouth every 8 (eight) hours. 10/20/15  Yes Enedina Finner, MD  metoprolol (LOPRESSOR) 50 MG tablet Take 1 tablet (50 mg total) by mouth 2 (two) times daily. 10/20/15  Yes Enedina Finner, MD  nitroGLYCERIN (NITROSTAT) 0.4 MG SL tablet Place 1 tablet (0.4 mg total) under the tongue every 5 (five) minutes as needed for chest pain. 10/20/15  Yes Enedina Finner, MD  HYDROcodone-acetaminophen (NORCO/VICODIN) 5-325 MG tablet Take 1-2 tablets by mouth every 4 (four) hours as needed for moderate pain. Patient not taking: Reported on 06/14/2016 10/20/15   Enedina Finner, MD    Allergies Codeine    Social History Social History  Substance Use Topics  . Smoking status: Former Smoker    Quit date: 08/17/2015  . Smokeless tobacco: Never Used  . Alcohol use No    Review of Systems Patient denies headaches, rhinorrhea, blurry vision, numbness, shortness of breath, chest pain, edema, cough, abdominal pain, nausea, vomiting, diarrhea, dysuria, fevers, rashes or hallucinations unless otherwise stated above in HPI. ____________________________________________   PHYSICAL EXAM:  VITAL SIGNS: Vitals:   06/14/16 1311 06/14/16 1400  BP: (!) 201/117 (!) 201/122  Pulse:  68  Resp:  16  Temp:      Constitutional: Alert and oriented. Well appearing and in no acute distress. Eyes: Conjunctivae are normal. PERRL. EOMI. Head: Atraumatic. Nose: No congestion/rhinnorhea. Mouth/Throat: Mucous membranes are moist.  Oropharynx non-erythematous.  Neck: No stridor. Painless ROM. No cervical spine tenderness to palpation Hematological/Lymphatic/Immunilogical: No cervical lymphadenopathy. Cardiovascular: Normal rate, regular rhythm. Grossly normal heart sounds.  Good peripheral circulation.  @+ pulses bilaterally Respiratory: Normal respiratory effort.  No retractions. Lungs CTAB. Gastrointestinal: Soft and nontender. No distention. No abdominal bruits. No CVA tenderness. Genitourinary:  Musculoskeletal:  No lower extremity tenderness nor edema.  No joint effusions. Neurologic:  Normal speech and language. No gross focal neurologic deficits are appreciated. No gait instability. Skin:  Skin is warm, dry and intact. No rash noted. Psychiatric: Mood and affect are normal. Speech and behavior are normal.  ____________________________________________   LABS (all labs ordered are listed, but only abnormal results are displayed)  Results for orders placed or performed during the hospital encounter of 06/14/16 (from the past 24 hour(s))  Basic metabolic panel     Status: Abnormal   Collection Time: 06/14/16  1:12 PM  Result Value Ref Range   Sodium 141 135 - 145 mmol/L   Potassium 3.2 (L) 3.5 - 5.1 mmol/L   Chloride 106 101 - 111 mmol/L   CO2 29 22 - 32 mmol/L   Glucose, Bld 88 65 - 99 mg/dL   BUN 25 (H) 6 - 20 mg/dL   Creatinine, Ser 1.611.82 (H) 0.61 - 1.24 mg/dL   Calcium 8.5 (L) 8.9 - 10.3 mg/dL   GFR calc non Af Amer 41 (L) >60 mL/min   GFR calc Af Amer 48 (L) >60 mL/min   Anion gap 6 5 - 15  CBC     Status: None   Collection Time: 06/14/16  1:12 PM  Result Value Ref Range   WBC 5.1 3.8 - 10.6 K/uL   RBC 5.01 4.40 - 5.90 MIL/uL   Hemoglobin 15.0 13.0 - 18.0 g/dL   HCT 09.643.2 04.540.0 - 40.952.0 %   MCV 86.2 80.0 - 100.0 fL   MCH 30.0 26.0 - 34.0 pg   MCHC 34.8 32.0 - 36.0 g/dL   RDW 81.113.7 91.411.5 - 78.214.5 %   Platelets 207 150 - 440 K/uL  Troponin I     Status: Abnormal   Collection Time: 06/14/16  1:12 PM  Result Value Ref Range   Troponin I 0.11 (HH) <0.03 ng/mL  Brain natriuretic peptide     Status: Abnormal   Collection Time: 06/14/16  1:12 PM  Result Value Ref Range   B Natriuretic Peptide 169.0 (H) 0.0 - 100.0 pg/mL   ____________________________________________  EKG My review and personal interpretation at Time: 13:08   Indication: chest pain  Rate: 75  Rhythm: sinus Axis: left Other: lateral T wave inversions, no STEMI, normal  intervals ____________________________________________  RADIOLOGY  I personally reviewed all radiographic images ordered to evaluate for the above acute complaints and reviewed radiology reports and findings.  These findings were personally discussed with the patient.  Please see medical record for radiology report.  ____________________________________________   PROCEDURES  Procedure(s) performed:  Procedures    Critical Care performed: yes CRITICAL CARE Performed by: Willy EddyPatrick Almedia Cordell   Total critical care time: 30 minutes  Critical care time was exclusive of separately billable procedures and treating other patients.  Critical care was necessary to treat or prevent imminent or life-threatening deterioration.  Critical care was time spent personally by me on the following activities: development of treatment plan with patient and/or surrogate as well as nursing, discussions with consultants, evaluation of patient's response to treatment, examination of patient, obtaining history from patient or surrogate, ordering and performing treatments  and interventions, ordering and review of laboratory studies, ordering and review of radiographic studies, pulse oximetry and re-evaluation of patient's condition.  ____________________________________________   INITIAL IMPRESSION / ASSESSMENT AND PLAN / ED COURSE  Pertinent labs & imaging results that were available during my care of the patient were reviewed by me and considered in my medical decision making (see chart for details).  DDX: ACS, pericarditis, esophagitis, boerhaaves, pe, dissection, pna, bronchitis, costochondritis   Asahd Can is a 51 y.o. who presents to the ED with elevated blood pressure as well as chest pain and shortness of breath as described above. Arrives afebrile but hypertensive. Clinically appears to have some component of worsening congestive heart failure based on his edema. No respiratory distress. EKG with  some concerning T-wave inversions as compared to previous in the lateral leads. Not consistent with an ST elevation MI. Initial troponin elevated to 0.11.  Chest x-ray shows no evidence of mediastinal widening. No pneumothorax. No consolidation. Clinically this is not consistent with dissection as the patient has had ongoing symptoms since Friday without any neuro deficits or pain ripping or tearing through to his back. His abdominal exam is soft and benign. At this point I am primarily concerned for underlying primary cardiac etiology. We'll touch base with Cardiology due to concern for ACS versus hypertensive urgency.  The patient will be placed on continuous pulse oximetry and telemetry for monitoring.  Laboratory evaluation will be sent to evaluate for the above complaints.     Clinical Course as of Jun 14 1537  Mon Jun 14, 2016  1530 I spoke with Dr. Juliann Pares who has personally reviewed the EKG and in light of the patient's risk factors and history of chest pain or past 3 days agrees with heparinization for further workup.  Spoke with hospitalist who agreed to admit patient for further evaluation and management.  [PR]    Clinical Course User Index [PR] Willy Eddy, MD     ____________________________________________   FINAL CLINICAL IMPRESSION(S) / ED DIAGNOSES  Final diagnoses:  Chest pain, unspecified type  Elevated troponin I level  Hypertensive urgency      NEW MEDICATIONS STARTED DURING THIS VISIT:  New Prescriptions   No medications on file     Note:  This document was prepared using Dragon voice recognition software and may include unintentional dictation errors.    Willy Eddy, MD 06/14/16 1540

## 2016-06-14 NOTE — H&P (Signed)
[facility] [place] at [facility]   PATIENT NAME: Trevor Nunez    MR#:  [id number]  DATE OF BIRTH:  [birth year]  DATE OF ADMISSION:  06/14/2016  PRIMARY CARE PHYSICIAN: No PCP Per Patient   REQUESTING/REFERRING PHYSICIAN:   CHIEF COMPLAINT:   Chief Complaint  Patient presents with  . Chest Pain    HISTORY OF PRESENT ILLNESS: Trevor Nunez  is a 51 y.o. male with a known history of congestive heart failure, hypertension presented to the emergency room with the chest discomfort and difficulty breathing. Patient first experienced the chest pain Friday morning. Pain was located in the left side of the chest, 7 out of 10 on scale of 1-10. Pain was sharp in nature. Patient had difficulty breathing and also felt short of breath whenever he lies down. He has noted more progressive swelling in the legs for the last couple of weeks. He has been compliant with his Lasix medication. The patient presented to the emergency room his blood pressure was high diastolic pressure was more than 100 and systolic blood pressure was around 1 90 mmHg he was evaluated in the emergency room troponin was positive and he was started on IV heparin drip. EKG showed T inversions in the lateral leads. Hospitalist service was consulted for further care of the patient. Patient does not follow any cardiology in the clinic. Patient's blood pressure is high we'll try IV hydralazine when necessary to control the blood pressure and also oral hydralazine and oral metoprolol. His blood pressure does not come down then we'll start IV nitroglycerin drip.  PAST MEDICAL HISTORY:   Past Medical History:  Diagnosis Date  . CHF (congestive heart failure) (HCC)   . Hypertension     PAST SURGICAL HISTORY: Past Surgical History:  Procedure Laterality Date  . CHOLECYSTECTOMY    . HERNIA REPAIR      SOCIAL HISTORY:  Social History  Substance Use Topics  . Smoking status: Former Smoker    Quit date: 08/17/2015   . Smokeless tobacco: Never Used  . Alcohol use No    FAMILY HISTORY:  Family History  Problem Relation Age of Onset  . Hypertension Mother   . Heart attack Mother   . CVA Mother   . Heart disease Sister     DRUG ALLERGIES:  Allergies  Allergen Reactions  . Codeine Palpitations    REVIEW OF SYSTEMS:   CONSTITUTIONAL: No fever, fatigue or weakness.  EYES: No blurred or double vision.  EARS, NOSE, AND THROAT: No tinnitus or ear pain.  RESPIRATORY: No cough, shortness of breath, wheezing or hemoptysis.  CARDIOVASCULAR: Has chest pain, orthopnea, edema.  GASTROINTESTINAL: No nausea, vomiting, diarrhea or abdominal pain.  GENITOURINARY: No dysuria, hematuria.  ENDOCRINE: No polyuria, nocturia,  HEMATOLOGY: No anemia, easy bruising or bleeding SKIN: No rash or lesion. MUSCULOSKELETAL: No joint pain or arthritis.   NEUROLOGIC: Has tingling left arm, no numbness, weakness.  PSYCHIATRY: No anxiety or depression.   MEDICATIONS AT HOME:  Prior to Admission medications   Medication Sig Start Date End Date Taking? Authorizing Provider  cloNIDine (CATAPRES) 0.1 MG tablet Take 1 tablet (0.1 mg total) by mouth daily. 10/20/15  Yes Enedina Finner, MD  furosemide (LASIX) 40 MG tablet Take 1 tablet (40 mg total) by mouth daily. 10/20/15  Yes Enedina Finner, MD  hydrALAZINE (APRESOLINE) 50 MG tablet Take 1 tablet (50 mg total) by mouth every 8 (eight) hours. 10/20/15  Yes Enedina Finner, MD  metoprolol (LOPRESSOR) 50  MG tablet Take 1 tablet (50 mg total) by mouth 2 (two) times daily. 10/20/15  Yes Enedina Finner, MD  nitroGLYCERIN (NITROSTAT) 0.4 MG SL tablet Place 1 tablet (0.4 mg total) under the tongue every 5 (five) minutes as needed for chest pain. 10/20/15  Yes Enedina Finner, MD  HYDROcodone-acetaminophen (NORCO/VICODIN) 5-325 MG tablet Take 1-2 tablets by mouth every 4 (four) hours as needed for moderate pain. Patient not taking: Reported on 06/14/2016 10/20/15   Enedina Finner, MD      PHYSICAL EXAMINATION:    VITAL SIGNS: Blood pressure (!) 191/126, pulse 67, temperature 98.8 F (37.1 C), temperature source Oral, resp. rate 17, height 6' (1.829 m), weight 113.4 kg (250 lb), SpO2 94 %.  GENERAL:  51 y.o.-year-old well built male patient lying in the bed with no acute distress.  EYES: Pupils equal, round, reactive to light and accommodation. No scleral icterus. Extraocular muscles intact.  HEENT: Head atraumatic, normocephalic. Oropharynx and nasopharynx clear.  NECK:  Supple, no jugular venous distention. No thyroid enlargement, no tenderness.  LUNGS: Normal breath sounds bilaterally, no wheezing, rales,rhonchi or crepitation. No use of accessory muscles of respiration.  CARDIOVASCULAR: S1, S2 normal. No murmurs, rubs, or gallops.  ABDOMEN: Soft, nontender, nondistended. Bowel sounds present. No organomegaly or mass.  EXTREMITIES: Has 2 plus pedal edema, no cyanosis, or clubbing.  NEUROLOGIC: Cranial nerves II through XII are intact. Muscle strength 5/5 in all extremities. Sensation intact. Gait not checked.  PSYCHIATRIC: The patient is alert and oriented x 3.  SKIN: No obvious rash, lesion, or ulcer.   LABORATORY PANEL:   CBC  Recent Labs Lab 06/14/16 1312  WBC 5.1  HGB 15.0  HCT 43.2  PLT 207  MCV 86.2  MCH 30.0  MCHC 34.8  RDW 13.7   ------------------------------------------------------------------------------------------------------------------  Chemistries   Recent Labs Lab 06/14/16 1312  NA 141  K 3.2*  CL 106  CO2 29  GLUCOSE 88  BUN 25*  CREATININE 1.82*  CALCIUM 8.5*   ------------------------------------------------------------------------------------------------------------------ estimated creatinine clearance is 61.7 mL/min (by C-G formula based on SCr of 1.82 mg/dL (H)). ------------------------------------------------------------------------------------------------------------------ No results for input(s): TSH, T4TOTAL, T3FREE, THYROIDAB in the last 72  hours.  Invalid input(s): FREET3   Coagulation profile No results for input(s): INR, PROTIME in the last 168 hours. ------------------------------------------------------------------------------------------------------------------- No results for input(s): DDIMER in the last 72 hours. -------------------------------------------------------------------------------------------------------------------  Cardiac Enzymes  Recent Labs Lab 06/14/16 1312  TROPONINI 0.11*   ------------------------------------------------------------------------------------------------------------------ Invalid input(s): POCBNP  ---------------------------------------------------------------------------------------------------------------  Urinalysis No results found for: COLORURINE, APPEARANCEUR, LABSPEC, PHURINE, GLUCOSEU, HGBUR, BILIRUBINUR, KETONESUR, PROTEINUR, UROBILINOGEN, NITRITE, LEUKOCYTESUR   RADIOLOGY: Dg Chest 2 View  Result Date: 06/14/2016 CLINICAL DATA:  Chest pain and shortness of breath EXAM: CHEST  2 VIEW COMPARISON:  October 17, 2015 FINDINGS: There is stable interstitial prominence in the bases, likely representing a degree of fibrotic type change. No edema or consolidation evident. Heart size and pulmonary vascularity are normal. No adenopathy. No bone lesions. No pneumothorax. IMPRESSION: Stable interstitial thickening in the bases, likely fibrotic type change. No frank edema or consolidation. Stable cardiac silhouette. Electronically Signed   By: Bretta Bang III M.D.   On: 06/14/2016 13:44    EKG: Orders placed or performed during the hospital encounter of 06/14/16  . EKG 12-Lead  . EKG 12-Lead  . ED EKG within 10 minutes  . ED EKG within 10 minutes    IMPRESSION AND PLAN: 51 year old male patient with history of hypertension, congestive heart failure presented to the emergency  room with chest discomfort and difficulty breathing. Admitting diagnosis 1. Non-ST elevation  myocardial infarction 2. Decompensated heart failure 3. Hypokalemia 4. Hypertensive urgency Treatment plan Admit patient to stepdown unit  Start patient on IV heparin drip Diurese patient with IV Lasix Replace potassium orally Control blood pressure with oral metoprolol, hydralazine IV when necessary If blood pressure cant be controlled, will start IV nitroglycerin drip. Cardiology consultation Check echocardiogram to assess LV function Monitor electrolytes Cycle troponin  Supportive care.   All the records are reviewed and case discussed with ED provider. Management plans discussed with the patient, family and they are in agreement.  CODE STATUS:FULL CODE Code Status History    Date Active Date Inactive Code Status Order ID Comments User Context   10/17/2015 10:07 PM 10/21/2015  3:59 AM Full Code [id number]  Oralia Manisavid Willis, MD Inpatient       TOTAL TIME TAKING CARE OF THIS PATIENT: 53 minutes.    Ihor AustinPavan Lakyia Behe M.D on 06/14/2016 at 4:21 PM  Between 7am to 6pm - Pager - [phone]  After 6pm go to [email or web address] - password [email or web address] [facility]  [facility]  Office  [phone]  CC: Primary care physician; No PCP Per Patient

## 2016-06-14 NOTE — ED Notes (Signed)
[facility] pain radiating across [facility] chest , SHOB,,HTN

## 2016-06-14 NOTE — Progress Notes (Signed)
ANTICOAGULATION CONSULT NOTE - Initial Consult  Pharmacy Consult for heparin dosing Indication: chest pain/ACS  Allergies  Allergen Reactions  . Codeine Palpitations    Patient Measurements: Height: 6' (182.9 cm) Weight: 250 lb (113.4 kg) IBW/kg (Calculated) : 77.6 Heparin Dosing Weight: 101.9 kg  Vital Signs: Temp: 98.8 F (37.1 C) (02/26 1310) Temp Source: Oral (02/26 1310) BP: 177/122 (02/26 1545) Pulse Rate: 74 (02/26 1545)  Labs:  Recent Labs  06/14/16 1312  HGB 15.0  HCT 43.2  PLT 207  CREATININE 1.82*  TROPONINI 0.11*    Estimated Creatinine Clearance: 61.7 mL/min (by C-G formula based on SCr of 1.82 mg/dL (H)).   Medical History: Past Medical History:  Diagnosis Date  . CHF (congestive heart failure) (HCC)   . Hypertension     Medications:  Scheduled:  . heparin  4,000 Units Intravenous Once  . nitroGLYCERIN  0.5 inch Topical Once    Assessment: Patient admitted w/ CP and trop of 0.11 Pharmacy has been consulted for heparin dosing  Goal of Therapy:  Heparin level 0.3-0.7 units/ml Monitor platelets by anticoagulation protocol: Yes   Plan:  Give 4000 units bolus x 1  Drew stat protime/INR and aPTT Will start heparin drip at 1400 units/hour (14 units/kg/hr) Will draw heparin level 6 hours after start of infusion 2/26 @ 2200.  Thomasene Rippleavid Hagen Tidd, PharmD, BCPS Clinical Pharmacist 06/14/2016

## 2016-06-14 NOTE — ED Triage Notes (Signed)
Pt c/o pain across his chest and into the back with SOB, worse with laying, since Friday..Marland Kitchen

## 2016-06-15 ENCOUNTER — Inpatient Hospital Stay (HOSPITAL_COMMUNITY)
Admit: 2016-06-15 | Discharge: 2016-06-15 | Disposition: A | Discharge status: Home / Self Care | Payer: Self-pay | Attending: Internal Medicine | Admitting: Internal Medicine

## 2016-06-15 ENCOUNTER — Encounter: Payer: Self-pay | Admitting: Physician Assistant

## 2016-06-15 DIAGNOSIS — Z9114 Patient's other noncompliance with medication regimen: Secondary | ICD-10-CM

## 2016-06-15 DIAGNOSIS — I5033 Acute on chronic diastolic (congestive) heart failure: Secondary | ICD-10-CM

## 2016-06-15 DIAGNOSIS — N183 Chronic kidney disease, stage 3 unspecified: Secondary | ICD-10-CM

## 2016-06-15 DIAGNOSIS — R079 Chest pain, unspecified: Secondary | ICD-10-CM

## 2016-06-15 DIAGNOSIS — I248 Other forms of acute ischemic heart disease: Secondary | ICD-10-CM

## 2016-06-15 DIAGNOSIS — I10 Essential (primary) hypertension: Secondary | ICD-10-CM

## 2016-06-15 LAB — BASIC METABOLIC PANEL
Anion gap: 8 (ref 5–15)
BUN: 24 mg/dL — AB (ref 6–20)
CO2: 27 mmol/L (ref 22–32)
CREATININE: 1.55 mg/dL — AB (ref 0.61–1.24)
Calcium: 8.9 mg/dL (ref 8.9–10.3)
Chloride: 105 mmol/L (ref 101–111)
GFR calc Af Amer: 58 mL/min — ABNORMAL LOW (ref >60)
GFR, EST NON AFRICAN AMERICAN: 50 mL/min — AB (ref >60)
GLUCOSE: 114 mg/dL — AB (ref 65–99)
POTASSIUM: 3.8 mmol/L (ref 3.5–5.1)
Sodium: 140 mmol/L (ref 135–145)

## 2016-06-15 LAB — LIPID PANEL
CHOL/HDL RATIO: 3.8 ratio
Cholesterol: 134 mg/dL (ref 0–200)
HDL: 35 mg/dL — ABNORMAL LOW (ref >40)
LDL Cholesterol: 82 mg/dL (ref 0–99)
Triglycerides: 83 mg/dL (ref <150)
VLDL: 17 mg/dL (ref 0–40)

## 2016-06-15 LAB — URINE DRUG SCREEN, QUALITATIVE (ARMC ONLY)
AMPHETAMINES, UR SCREEN: NOT DETECTED
Barbiturates, Ur Screen: NOT DETECTED
Benzodiazepine, Ur Scrn: NOT DETECTED
COCAINE METABOLITE, UR ~~LOC~~: NOT DETECTED
Cannabinoid 50 Ng, Ur ~~LOC~~: NOT DETECTED
MDMA (ECSTASY) UR SCREEN: NOT DETECTED
METHADONE SCREEN, URINE: NOT DETECTED
Opiate, Ur Screen: NOT DETECTED
Phencyclidine (PCP) Ur S: NOT DETECTED
TRICYCLIC, UR SCREEN: NOT DETECTED

## 2016-06-15 LAB — ECHOCARDIOGRAM COMPLETE
HEIGHTINCHES: 72 in
WEIGHTICAEL: 4000 [oz_av]

## 2016-06-15 LAB — HEPARIN LEVEL (UNFRACTIONATED): Heparin Unfractionated: 0.52 IU/mL (ref 0.30–0.70)

## 2016-06-15 LAB — TROPONIN I
TROPONIN I: 0.08 ng/mL — AB (ref <0.03)
Troponin I: 0.09 ng/mL (ref <0.03)

## 2016-06-15 MED ORDER — HYDRALAZINE HCL 50 MG PO TABS
50.0000 mg | ORAL_TABLET | Freq: Three times a day (TID) | ORAL | Status: DC
  Administered 2016-06-15 – 2016-06-16 (×2): 50 mg via ORAL
  Filled 2016-06-15 (×3): qty 1

## 2016-06-15 MED ORDER — CLONIDINE HCL 0.1 MG PO TABS
0.2000 mg | ORAL_TABLET | Freq: Two times a day (BID) | ORAL | Status: DC
  Administered 2016-06-15: 0.2 mg via ORAL
  Filled 2016-06-15: qty 2

## 2016-06-15 MED ORDER — HYDRALAZINE HCL 50 MG PO TABS: 100.0000 mg | ORAL_TABLET | Freq: Three times a day (TID) | ORAL | Status: DC

## 2016-06-15 MED ORDER — AMLODIPINE BESYLATE 5 MG PO TABS: 5.0000 mg | ORAL_TABLET | Freq: Every day | ORAL | Status: DC

## 2016-06-15 MED ORDER — HYDRALAZINE HCL 20 MG/ML IJ SOLN
10.0000 mg | Freq: Four times a day (QID) | INTRAMUSCULAR | Status: DC | PRN
  Administered 2016-06-16: 10 mg via INTRAVENOUS

## 2016-06-15 MED ORDER — LISINOPRIL 20 MG PO TABS
20.0000 mg | ORAL_TABLET | Freq: Every day | ORAL | Status: DC
  Administered 2016-06-15 – 2016-06-17 (×3): 20 mg via ORAL
  Filled 2016-06-15 (×3): qty 1

## 2016-06-15 MED ORDER — ENOXAPARIN SODIUM 40 MG/0.4ML ~~LOC~~ SOLN
40.0000 mg | SUBCUTANEOUS | Status: DC
  Administered 2016-06-15 – 2016-06-16 (×2): 40 mg via SUBCUTANEOUS
  Filled 2016-06-15 (×2): qty 0.4

## 2016-06-15 NOTE — Consult Note (Signed)
Cardiology Consultation Note  Patient ID: Trevor Nunez, MRN: [id number], DOB/AGE: 08/16/1965 51 y.o. Admit date: 06/14/2016   Date of Consult: 06/15/2016 Primary Physician: No PCP Per Patient Primary Cardiologist: Dr. Mariah Milling, MD Requesting Physician: Dr. Tobi Bastos, MD  Chief Complaint: SOB/chest pain Reason for Consult: Elevated troponin  HPI: 51 y.o. male with h/o poorly controlled hypertension for at least 20 years with hypertensive heart disease, medication noncompliance, chronic diastolic heart failure, CKD stage III, and ongoing tobacco abuse who presented to [facility] on 2/26 with a three-day history of intermittent chest pain, shortness of breath, chest congestion, nasal congestion, cough productive of yellow sputum.  Patient was previously admitted and July 2017 for similar symptoms in the setting of poorly controlled hypertension with systolic blood pressure at that time of 210 systolic. At that time he was found to have a minimally elevated troponin of 0.3 that was felt to be supply demand ischemia in the setting of his malignant hypertension. Echo during that admission showed EF of 55-60%, no regional wall motion abnormalities, LV diastolic function parameters were normal, mildly dilated ascending aorta at 41 mm, mildly dilated aortic root at 38 mm, mild MR, left atrium mildly dilated, RV systolic function normal, PSP could not be accurately estimated. His blood pressure was controlled during that admission and outpatient follow-up was advised. Unfortunately, patient was a no-show for his CHF clinic appointment, as well as for cardiology appointment.  Patient reports compliance with his medications at home. He does report blood pressure routinely running in the 180-190 systolic range at home. Weight has been stable. No increase in lower extremity swelling. On 2/23 patient developed a bandlike chest pain that was intermittent and not associated with exertion or rest. It should be noted that he has  also been dealing with nasal congestion and chest congestion, coughing up yellow sputum around this same time. Because of his chest pain the patient presented to [facility] overnight into 2/27.  Upon the patient's arrival to [facility] they were found to have a minimally elevated troponin of 0.1 but has down trended to 0.08 currently. He was noted to be severely hypertensive with a systolic blood pressure greater than 200 mmHg that has improved to systolic blood pressure in the 150s with titration of hydralazine to 100 mg every 8 hours, IV Lasix, and continuation of home clonidine as well as Lopressor. He was also noted to have a minimally elevated BNP of 169. Renal function appeared at his approximate baseline at 1.8 that has improved to 1.5 currently. CBC unremarkable. ECG showed sinus rhythm, 77 bpm, LVH, frequent PACs, lateral T-wave inversion that is more pronounced currently when compared to prior EKG. CXR showed no vascular congestion or effusion. Currently with improved chest pain as well as improved BP. Echo is pending per IM.  Past Medical History:  Diagnosis Date  . Chronic diastolic CHF (congestive heart failure) (HCC)   . CKD (chronic kidney disease), stage III   . H/O medication noncompliance   . Hypertension       Most Recent Cardiac Studies: TTE 10/2015: Study Conclusions  - Left ventricle: The cavity size was normal. There was moderate   concentric hypertrophy. Systolic function was normal. The   estimated ejection fraction was in the range of 55% to 60%. Wall   motion was normal; there were no regional wall motion   abnormalities. Left ventricular diastolic function parameters   were normal. - Aorta: Ascending aorta is mildly dilated, diameter: 41 mm (S). - Aortic root: The aortic  root was mildly dilated, 3.8 cm - Mitral valve: There was mild regurgitation. - Left atrium: The atrium was mildly dilated. - Right ventricle: Systolic function was normal. - Pulmonary arteries: Systolic  pressure could not be accurately   estimated.  Impressions:  - Challenging image quality   Surgical History:  Past Surgical History:  Procedure Laterality Date  . CHOLECYSTECTOMY    . HERNIA REPAIR       Home Meds: Prior to Admission medications   Medication Sig Start Date End Date Taking? Authorizing Provider  cloNIDine (CATAPRES) 0.1 MG tablet Take 1 tablet (0.1 mg total) by mouth daily. 10/20/15  Yes Enedina Finner, MD  furosemide (LASIX) 40 MG tablet Take 1 tablet (40 mg total) by mouth daily. 10/20/15  Yes Enedina Finner, MD  hydrALAZINE (APRESOLINE) 50 MG tablet Take 1 tablet (50 mg total) by mouth every 8 (eight) hours. 10/20/15  Yes Enedina Finner, MD  metoprolol (LOPRESSOR) 50 MG tablet Take 1 tablet (50 mg total) by mouth 2 (two) times daily. 10/20/15  Yes Enedina Finner, MD  nitroGLYCERIN (NITROSTAT) 0.4 MG SL tablet Place 1 tablet (0.4 mg total) under the tongue every 5 (five) minutes as needed for chest pain. 10/20/15  Yes Enedina Finner, MD  HYDROcodone-acetaminophen (NORCO/VICODIN) 5-325 MG tablet Take 1-2 tablets by mouth every 4 (four) hours as needed for moderate pain. Patient not taking: Reported on 06/14/2016 10/20/15   Enedina Finner, MD    Inpatient Medications:  . aspirin EC  81 mg Oral Daily  . atorvastatin  40 mg Oral q1800  . cloNIDine  0.2 mg Oral BID  . furosemide  40 mg Intravenous Q12H  . hydrALAZINE  100 mg Oral Q8H  . lisinopril  20 mg Oral Daily  . metoprolol  50 mg Oral BID  . potassium chloride  40 mEq Oral Once  . sodium chloride flush  3 mL Intravenous Q12H     Allergies:  Allergies  Allergen Reactions  . Codeine Palpitations    Social History   Social History  . Marital status: Single    Spouse name: N/A  . Number of children: N/A  . Years of education: N/A   Occupational History  . medtech nurse at [employer]    Social History Main Topics  . Smoking status: Former Smoker    Quit date: 08/17/2015  . Smokeless tobacco: Never Used  . Alcohol use No  .  Drug use: No  . Sexual activity: Not on file   Other Topics Concern  . Not on file   Social History Narrative  . No narrative on file     Family History  Problem Relation Age of Onset  . Hypertension Mother   . Heart attack Mother   . CVA Mother   . Heart disease Sister      Review of Systems: Review of Systems  Constitutional: Positive for malaise/fatigue. Negative for chills, diaphoresis, fever and weight loss.  HENT: Positive for congestion.   Eyes: Negative for discharge and redness.  Respiratory: Positive for cough and shortness of breath. Negative for hemoptysis, sputum production and wheezing.        Yellow sputum  Cardiovascular: Positive for chest pain. Negative for palpitations, orthopnea, claudication, leg swelling and PND.  Gastrointestinal: Negative for abdominal pain, blood in stool, heartburn, melena, nausea and vomiting.  Genitourinary: Negative for hematuria.  Musculoskeletal: Negative for falls and myalgias.  Skin: Negative for rash.  Neurological: Positive for headaches. Negative for dizziness, tingling, tremors, sensory change,  speech change, focal weakness, loss of consciousness and weakness.  Endo/Heme/Allergies: Does not bruise/bleed easily.  Psychiatric/Behavioral: Negative for substance abuse. The patient is not nervous/anxious.   All other systems reviewed and are negative.   Labs:  Recent Labs  06/14/16 1312 06/14/16 1952 06/15/16 0121 06/15/16 0802  TROPONINI 0.11* 0.09* 0.09* 0.08*   Lab Results  Component Value Date   WBC 5.1 06/14/2016   HGB 15.0 06/14/2016   HCT 43.2 06/14/2016   MCV 86.2 06/14/2016   PLT 207 06/14/2016     Recent Labs Lab 06/15/16 0802  NA 140  K 3.8  CL 105  CO2 27  BUN 24*  CREATININE 1.55*  CALCIUM 8.9  GLUCOSE 114*   Lab Results  Component Value Date   CHOL 134 06/15/2016   HDL 35 (L) 06/15/2016   LDLCALC 82 06/15/2016   TRIG 83 06/15/2016   No results found for:  DDIMER  Radiology/Studies:  Dg Chest 2 View  Result Date: 06/14/2016 CLINICAL DATA:  Chest pain and shortness of breath EXAM: CHEST  2 VIEW COMPARISON:  October 17, 2015 FINDINGS: There is stable interstitial prominence in the bases, likely representing a degree of fibrotic type change. No edema or consolidation evident. Heart size and pulmonary vascularity are normal. No adenopathy. No bone lesions. No pneumothorax. IMPRESSION: Stable interstitial thickening in the bases, likely fibrotic type change. No frank edema or consolidation. Stable cardiac silhouette. Electronically Signed   By: Bretta BangWilliam  Woodruff III M.D.   On: 06/14/2016 13:44    EKG: Interpreted by me showed: NSR< 77 bpm, frequent PACs, LVH, lateral TWI more pronounced currently Telemetry: Interpreted by me showed: sinus rhythm 60's bpm  Weights: Filed Weights   06/14/16 1311  Weight: 250 lb (113.4 kg)     Physical Exam: Blood pressure (!) 155/104, pulse 66, temperature 97.8 F (36.6 C), temperature source Oral, resp. rate 20, height 6' (1.829 m), weight 250 lb (113.4 kg), SpO2 96 %. Body mass index is 33.91 kg/m. General: Well developed, well nourished, in no acute distress. Head: Normocephalic, atraumatic, sclera non-icteric, no xanthomas, nares are without discharge.  Neck: Negative for carotid bruits. JVD not elevated. Lungs: Clear bilaterally to auscultation without wheezes, rales, or rhonchi. Breathing is unlabored. Heart: RRR with S1 S2. II/Vi systolic murmur, no rubs, or gallops appreciated. Abdomen: Soft, non-tender, non-distended with normoactive bowel sounds. No hepatomegaly. No rebound/guarding. No obvious abdominal masses. Msk:  Strength and tone appear normal for age. Extremities: No clubbing or cyanosis. No edema. Distal pedal pulses are 2+ and equal bilaterally. Neuro: Alert and oriented X 3. No facial asymmetry. No focal deficit. Moves all extremities spontaneously. Psych:  Responds to questions appropriately  with a normal affect.    Assessment and Plan:  Principal Problem:   Accelerated hypertension Active Problems:   Acute on chronic diastolic CHF (congestive heart failure) (HCC)   Chest pain with moderate risk for cardiac etiology   H/O medication noncompliance   CKD (chronic kidney disease), stage III   Demand ischemia (HCC)    1. Elevated troponin: -Minimally elevated at peak of 0.1 and down trending -Likely supply demand ischemia in the setting of his poorly controlled blood pressure with a systolic of 205 mmHg and CKD stage III -Currently without chest pain  -Echo is pending per IM  -Schedule nuclear pharmacological stress test on 2/28, as we would like to improve his blood pressure some prior to performing ischemic evaluation  -Stop heparin drip as this is not consistent with a  non-STEMI  -No further need of Plavix, as he was loaded upon admission  -Continue ASA 81 mg   2. Accelerated hypertension: -Presented with systolic blood pressure greater than 200 mmHg, BP has improved this morning to 127/87 with titration of hydralazine to 100 mg q 8 hours, Lopressor and clonidine with IV Lasix -It has been felt he should avoid calcium channel blocker given chronic lower extremity edema -Continue gentle IV diuresis with Lasix, and possibly transition to by mouth tomorrow -Compliance is crucial for him -Recommended outpatient renal artery ultrasound to evaluate for renal artery stenosis given his prolonged history of hypertension that has been poorly controlled  3. Mild acute on chronic diastolic CHF: -Likely in the setting of poorly controlled hypertension as above -BNP is minimally elevated at 169, chest x-ray without evidence of vascular congestion or effusion -Echo is pending per IM to evaluate EF, right sided pressures -Gentle diuresis as above, likely transitioning to by mouth on 2/28 -Will need follow-up in the CHF clinic  4. CKD stage III: -Renal function appears stable at  this time -Continue to monitor with diuresis  5. History of noncompliance presenting significant hazards to health: -Compliance is advised  6. Tobacco abuse: -Cessation is advised  7. URI: -Cough with yellow sputum and nasal congestion -Defer to IM   Signed, Eula Listen, PA-C [facility] Pager: [phone] 06/15/2016, 11:12 AM

## 2016-06-15 NOTE — Care Management Note (Signed)
Case Management Note  Patient Details  Name: Trevor Nunez MRN: [id number] Date of Birth: 1965-11-22  Subjective/Objective:                  Met with patient for discharge planning assessment. He states he does have health insurance through his employer. He does not have a PCP. He states he will arrange and understands the importance of having a provider to help with health management. He states he also has drug coverage.    Action/Plan: RNCM asked patient to obtain insurance information (he declines CM assessment) in case pricing for new medications are needed. [facility] financial assistance contact left with patient. Patient denies RNCM needs at this time however he is not clear what he might need at discharge. RNCM to continue to follow.     Expected Discharge Date:                  Expected Discharge Plan:     In-House Referral:  PCP / [facility]  Discharge planning Services  CM Consult  Post Acute Care Choice:    Choice offered to:     DME Arranged:    DME Agency:     HH Arranged:    HH Agency:     Status of Service:  In process, will continue to follow  If discussed at Long Length of Stay Meetings, dates discussed:    Additional Comments:  Marshell Garfinkel, RN 06/15/2016, 9:02 AM

## 2016-06-15 NOTE — Progress Notes (Signed)
[employer] - [place] at [facility]                                                                                                                                                                                  Patient Demographics   Trevor Nunez, is a 51 y.o. male, DOB - Oct 11, 1965, UJW:[id number]  Admit date - 06/14/2016   Admitting Physician Ihor AustinPavan Pyreddy, MD  Outpatient Primary MD for the patient is No PCP Per Patient   LOS - 1  Subjective: Patient admitted with chest pain and noted to have significantly elevated blood pressure Troponin borderline    Review of Systems:   CONSTITUTIONAL: No documented fever. No fatigue, weakness. No weight gain, no weight loss.  EYES: No blurry or double vision.  ENT: No tinnitus. No postnasal drip. No redness of the oropharynx.  RESPIRATORY: No cough, no wheeze, no hemoptysis. Positive dyspnea.  CARDIOVASCULAR: Positive chest pain. No orthopnea. No palpitations. No syncope.  GASTROINTESTINAL: No nausea, no vomiting or diarrhea. No abdominal pain. No melena or hematochezia.  GENITOURINARY: No dysuria or hematuria.  ENDOCRINE: No polyuria or nocturia. No heat or cold intolerance.  HEMATOLOGY: No anemia. No bruising. No bleeding.  INTEGUMENTARY: No rashes. No lesions.  MUSCULOSKELETAL: No arthritis. No swelling. No gout.  NEUROLOGIC: No numbness, tingling, or ataxia. No seizure-type activity.  PSYCHIATRIC: No anxiety. No insomnia. No ADD.    Vitals:   Vitals:   06/15/16 1100 06/15/16 1200 06/15/16 1455 06/15/16 1500  BP: 127/87 106/70 119/87 119/87  Pulse: 61 (!) 59  (!) 58  Resp: 20 20  (!) 24  Temp:      TempSrc:      SpO2: 96% 93%  93%  Weight:      Height:        Wt Readings from Last 3 Encounters:  06/14/16 250 lb (113.4 kg)  10/20/15 275 lb 8 oz (125 kg)     Intake/Output Summary (Last 24 hours) at 06/15/16 1507 Last data filed at 06/15/16 1340  Gross per 24 hour  Intake           430.16 ml  Output              2825 ml  Net         -2394.84 ml    Physical Exam:   GENERAL: Pleasant-appearing in no apparent distress.  HEAD, EYES, EARS, NOSE AND THROAT: Atraumatic, normocephalic. Extraocular muscles are intact. Pupils equal and reactive to light. Sclerae anicteric. No conjunctival injection. No oro-pharyngeal erythema.  NECK: Supple. There is no jugular venous distention. No bruits, no lymphadenopathy, no thyromegaly.  HEART: Regular rate and rhythm,. No murmurs, no  rubs, no clicks.  LUNGS: Clear to auscultation bilaterally. No rales or rhonchi. No wheezes.  ABDOMEN: Soft, flat, nontender, nondistended. Has good bowel sounds. No hepatosplenomegaly appreciated.  EXTREMITIES: No evidence of any cyanosis, clubbing, or peripheral edema.  +2 pedal and radial pulses bilaterally.  NEUROLOGIC: The patient is alert, awake, and oriented x3 with no focal motor or sensory deficits appreciated bilaterally.  SKIN: Moist and warm with no rashes appreciated.  Psych: Not anxious, depressed LN: No inguinal LN enlargement    Antibiotics   Anti-infectives    None      Medications   Scheduled Meds: . aspirin EC  81 mg Oral Daily  . atorvastatin  40 mg Oral q1800  . cloNIDine  0.2 mg Oral BID  . furosemide  40 mg Intravenous Q12H  . hydrALAZINE  100 mg Oral Q8H  . lisinopril  20 mg Oral Daily  . metoprolol  50 mg Oral BID  . potassium chloride  40 mEq Oral Once  . sodium chloride flush  3 mL Intravenous Q12H   Continuous Infusions: PRN Meds:.sodium chloride, acetaminophen, fentaNYL (SUBLIMAZE) injection, hydrALAZINE, hydrALAZINE, morphine injection, nitroGLYCERIN, ondansetron (ZOFRAN) IV, sodium chloride flush   Data Review:   Micro Results Recent Results (from the past 240 hour(s))  MRSA PCR Screening     Status: None   Collection Time: 06/14/16 10:32 PM  Result Value Ref Range Status   MRSA by PCR NEGATIVE NEGATIVE Final    Comment:        The GeneXpert MRSA Assay (FDA approved for  NASAL specimens only), is one component of a comprehensive MRSA colonization surveillance program. It is not intended to diagnose MRSA infection nor to guide or monitor treatment for MRSA infections.     Radiology Reports Dg Chest 2 View  Result Date: 06/14/2016 CLINICAL DATA:  Chest pain and shortness of breath EXAM: CHEST  2 VIEW COMPARISON:  October 17, 2015 FINDINGS: There is stable interstitial prominence in the bases, likely representing a degree of fibrotic type change. No edema or consolidation evident. Heart size and pulmonary vascularity are normal. No adenopathy. No bone lesions. No pneumothorax. IMPRESSION: Stable interstitial thickening in the bases, likely fibrotic type change. No frank edema or consolidation. Stable cardiac silhouette. Electronically Signed   By: Bretta Bang III M.D.   On: 06/14/2016 13:44     CBC  Recent Labs Lab 06/14/16 1312  WBC 5.1  HGB 15.0  HCT 43.2  PLT 207  MCV 86.2  MCH 30.0  MCHC 34.8  RDW 13.7    Chemistries   Recent Labs Lab 06/14/16 1312 06/15/16 0802  NA 141 140  K 3.2* 3.8  CL 106 105  CO2 29 27  GLUCOSE 88 114*  BUN 25* 24*  CREATININE 1.82* 1.55*  CALCIUM 8.5* 8.9   ------------------------------------------------------------------------------------------------------------------ estimated creatinine clearance is 72.5 mL/min (by C-G formula based on SCr of 1.55 mg/dL (H)). ------------------------------------------------------------------------------------------------------------------ No results for input(s): HGBA1C in the last 72 hours. ------------------------------------------------------------------------------------------------------------------  Recent Labs  06/15/16 0802  CHOL 134  HDL 35*  LDLCALC 82  TRIG 83  CHOLHDL 3.8   ------------------------------------------------------------------------------------------------------------------ No results for input(s): TSH, T4TOTAL, T3FREE, THYROIDAB  in the last 72 hours.  Invalid input(s): FREET3 ------------------------------------------------------------------------------------------------------------------ No results for input(s): VITAMINB12, FOLATE, FERRITIN, TIBC, IRON, RETICCTPCT in the last 72 hours.  Coagulation profile  Recent Labs Lab 06/14/16 1615  INR 0.98    No results for input(s): DDIMER in the last 72 hours.  Cardiac Enzymes  Recent Labs  Lab 06/14/16 1952 06/15/16 0121 06/15/16 0802  TROPONINI 0.09* 0.09* 0.08*   ------------------------------------------------------------------------------------------------------------------ Invalid input(s): POCBNP    Assessment & Plan  Patient is a 51 year old presenting with chest pain at surgery hypertension  1.   Accelerated hypertension I will increase his hydralazine. Patient also received a dose of clonidine so now much improved I'll stop clonidine Continue metoprolol I will also add lisinopril to his current regimen 2  Chest pain with moderate risk for cardiac etiology non-ST MI ruled out cardiology will likely do a stress test once blood pressure stable, troponin elevation due to demand ischemia and elevated blood pressure 3  Acute on chronic diastolic CHF (congestive heart failure) (HCC) improved with IV Lasix  4.   CKD (chronic kidney disease), stage III renal function improved with diuresis        Code Status Orders        Start     Ordered   06/14/16 1930  Full code  Continuous     06/14/16 1929    Code Status History    Date Active Date Inactive Code Status Order ID Comments User Context   10/17/2015 10:07 PM 10/21/2015  3:59 AM Full Code [id number]  Oralia Manis, MD Inpatient           Consults  Cardiology DVT Prophylaxis  Lovenox Lab Results  Component Value Date   PLT 207 06/14/2016     Time Spent in minutes   25m,in Greater than 50% of time spent in care coordination and counseling patient regarding the condition and plan of  care.   Auburn Bilberry M.D on 06/15/2016 at 3:07 PM  Between 7am to 6pm - Pager - [phone]  After 6pm go to [email or web address] - password [email or web address] [facility]  [facility]   Office  [phone]

## 2016-06-15 NOTE — Progress Notes (Signed)
*  PRELIMINARY RESULTS* Echocardiogram 2D Echocardiogram has been performed.  Cristela BlueHege, [place] 06/15/2016, 1:54 PM

## 2016-06-15 NOTE — Progress Notes (Addendum)
ANTICOAGULATION CONSULT NOTE - Initial Consult  Pharmacy Consult for heparin dosing Indication: chest pain/ACS  Allergies  Allergen Reactions  . Codeine Palpitations    Patient Measurements: Height: 6' (182.9 cm) Weight: 250 lb (113.4 kg) IBW/kg (Calculated) : 77.6 Heparin Dosing Weight: 101.9 kg  Vital Signs: Temp: 98.4 F (36.9 C) (02/26 2000) Temp Source: Oral (02/26 2000) BP: 184/118 (02/26 2300) Pulse Rate: 70 (02/26 2300)  Labs:  Recent Labs  06/14/16 1312 06/14/16 1615 06/14/16 1952 06/14/16 2207  HGB 15.0  --   --   --   HCT 43.2  --   --   --   PLT 207  --   --   --   APTT  --  28  --   --   LABPROT  --  13.0  --   --   INR  --  0.98  --   --   HEPARINUNFRC  --   --   --  0.47  CREATININE 1.82*  --   --   --   TROPONINI 0.11*  --  0.09*  --     Estimated Creatinine Clearance: 61.7 mL/min (by C-G formula based on SCr of 1.82 mg/dL (H)).   Medical History: Past Medical History:  Diagnosis Date  . CHF (congestive heart failure) (HCC)   . Hypertension     Medications:  Scheduled:  . aspirin EC  81 mg Oral Daily  . atorvastatin  40 mg Oral q1800  . furosemide  40 mg Intravenous Q12H  . hydrALAZINE  50 mg Oral Q8H  . metoprolol  50 mg Oral BID  . potassium chloride  40 mEq Oral Once  . sodium chloride flush  3 mL Intravenous Q12H    Assessment: Patient admitted w/ CP and trop of 0.11 Pharmacy has been consulted for heparin dosing  Goal of Therapy:  Heparin level 0.3-0.7 units/ml Monitor platelets by anticoagulation protocol: Yes   Plan:  Give 4000 units bolus x 1  Drew stat protime/INR and aPTT Will start heparin drip at 1400 units/hour (14 units/kg/hr) Will draw heparin level 6 hours after start of infusion 2/26 @ 2200.  2/26 2207 HL therapeutic x 1. Continue current rate. Will recheck HL in 6 hours.  2/27 0356 HL therapeutic x 2. Continue current rate. Pharmacy will continue to follow HL and CBC daily while on heparin.  Quenisha Lovins A.  [place], [place]., BCPS Clinical Pharmacist 06/15/2016

## 2016-06-16 ENCOUNTER — Inpatient Hospital Stay (HOSPITAL_BASED_OUTPATIENT_CLINIC_OR_DEPARTMENT_OTHER): Payer: Self-pay

## 2016-06-16 ENCOUNTER — Encounter: Payer: Self-pay | Admitting: Radiology

## 2016-06-16 DIAGNOSIS — R079 Chest pain, unspecified: Secondary | ICD-10-CM

## 2016-06-16 LAB — CBC
HCT: 46.5 % (ref 40.0–52.0)
Hemoglobin: 15.2 g/dL (ref 13.0–18.0)
MCH: 28.7 pg (ref 26.0–34.0)
MCHC: 32.7 g/dL (ref 32.0–36.0)
MCV: 87.9 fL (ref 80.0–100.0)
PLATELETS: 219 10*3/uL (ref 150–440)
RBC: 5.29 MIL/uL (ref 4.40–5.90)
RDW: 13.6 % (ref 11.5–14.5)
WBC: 6.8 10*3/uL (ref 3.8–10.6)

## 2016-06-16 LAB — NM MYOCAR MULTI W/SPECT W/WALL MOTION / EF
CHL CUP NUCLEAR SDS: 0
CHL CUP NUCLEAR SRS: 1
LV dias vol: 159 mL (ref 62–150)
LVSYSVOL: 91 mL
Peak HR: 82 {beats}/min
Percent HR: 48 %
Rest HR: 56 {beats}/min
SSS: 0
TID: 0.93

## 2016-06-16 LAB — BASIC METABOLIC PANEL
Anion gap: 7 (ref 5–15)
BUN: 28 mg/dL — AB (ref 6–20)
CALCIUM: 8.7 mg/dL — AB (ref 8.9–10.3)
CO2: 28 mmol/L (ref 22–32)
CREATININE: 1.85 mg/dL — AB (ref 0.61–1.24)
Chloride: 105 mmol/L (ref 101–111)
GFR calc Af Amer: 47 mL/min — ABNORMAL LOW (ref >60)
GFR, EST NON AFRICAN AMERICAN: 40 mL/min — AB (ref >60)
Glucose, Bld: 106 mg/dL — ABNORMAL HIGH (ref 65–99)
Potassium: 4 mmol/L (ref 3.5–5.1)
SODIUM: 140 mmol/L (ref 135–145)

## 2016-06-16 MED ORDER — CLONIDINE HCL 0.1 MG PO TABS: 0.2000 mg | ORAL_TABLET | Freq: Two times a day (BID) | ORAL | Status: DC

## 2016-06-16 MED ORDER — REGADENOSON 0.4 MG/5ML IV SOLN
0.4000 mg | Freq: Once | INTRAVENOUS | Status: AC
  Administered 2016-06-16: 0.4 mg via INTRAVENOUS

## 2016-06-16 MED ORDER — TECHNETIUM TC 99M TETROFOSMIN IV KIT
31.7330 | PACK | Freq: Once | INTRAVENOUS | Status: AC | PRN
  Administered 2016-06-16: 31.733 via INTRAVENOUS

## 2016-06-16 MED ORDER — TECHNETIUM TC 99M TETROFOSMIN IV KIT
13.5050 | PACK | Freq: Once | INTRAVENOUS | Status: AC | PRN
  Administered 2016-06-16: 13.505 via INTRAVENOUS

## 2016-06-16 MED ORDER — HYDRALAZINE HCL 50 MG PO TABS
100.0000 mg | ORAL_TABLET | Freq: Three times a day (TID) | ORAL | Status: DC
  Administered 2016-06-16 – 2016-06-17 (×3): 100 mg via ORAL
  Filled 2016-06-16 (×3): qty 2

## 2016-06-16 NOTE — Progress Notes (Signed)
This patient has appeared to be resting comfortably throughout this writer's shift, both watching television, talking on his cell phone, and sleeping. SL NTG given x 1 w/ noted relief of chest pain (was rated 6/10 initially). No PRN HTN meds had to be given. UDS was sent and results are negative for all drugs tested. Patient has been NPO since midnight. Will continue to monitor.

## 2016-06-16 NOTE — Progress Notes (Signed)
[employer] -  at [facility]                                                                                                                                                                                  Patient Demographics   Trevor Nunez, is a 50 y.o. male, DOB - [birth year], [id number]  Admit date - 06/14/2016   Admitting Physician Ihor Austin, MD  Outpatient Primary MD for the patient is No PCP Per Patient   LOS - 2  Subjective: No further chest pain blood pressure was low yesterday and now elevated  Review of Systems:   CONSTITUTIONAL: No documented fever. No fatigue, weakness. No weight gain, no weight loss.  EYES: No blurry or double vision.  ENT: No tinnitus. No postnasal drip. No redness of the oropharynx.  RESPIRATORY: No cough, no wheeze, no hemoptysis. Positive dyspnea.  CARDIOVASCULAR: Positive chest pain. No orthopnea. No palpitations. No syncope.  GASTROINTESTINAL: No nausea, no vomiting or diarrhea. No abdominal pain. No melena or hematochezia.  GENITOURINARY: No dysuria or hematuria.  ENDOCRINE: No polyuria or nocturia. No heat or cold intolerance.  HEMATOLOGY: No anemia. No bruising. No bleeding.  INTEGUMENTARY: No rashes. No lesions.  MUSCULOSKELETAL: No arthritis. No swelling. No gout.  NEUROLOGIC: No numbness, tingling, or ataxia. No seizure-type activity.  PSYCHIATRIC: No anxiety. No insomnia. No ADD.    Vitals:   Vitals:   06/16/16 0500 06/16/16 0600 06/16/16 0700 06/16/16 0800  BP: (!) 151/94 (!) 172/108 (!) 186/128 (!) 136/99  Pulse: 64  (!) 59 65  Resp: (!) 8 (!) 25 19 14   Temp:    98.4 F (36.9 C)  TempSrc:    Oral  SpO2: 92%  97% 95%  Weight:      Height:        Wt Readings from Last 3 Encounters:  06/14/16 250 lb (113.4 kg)  10/20/15 275 lb 8 oz (125 kg)     Intake/Output Summary (Last 24 hours) at 06/16/16 1307 Last data filed at 06/16/16 0800  Gross per 24 hour  Intake              240 ml  Output              1050 ml  Net             -810 ml    Physical Exam:   GENERAL: Pleasant-appearing in no apparent distress.  HEAD, EYES, EARS, NOSE AND THROAT: Atraumatic, normocephalic. Extraocular muscles are intact. Pupils equal and reactive to light. Sclerae anicteric. No conjunctival injection. No oro-pharyngeal erythema.  NECK: Supple. There is no jugular venous distention. No bruits, no lymphadenopathy, no thyromegaly.  HEART: Regular rate and rhythm,. No murmurs, no rubs, no clicks.  LUNGS: Clear to auscultation bilaterally. No rales or rhonchi. No wheezes.  ABDOMEN: Soft, flat, nontender, nondistended. Has good bowel sounds. No hepatosplenomegaly appreciated.  EXTREMITIES: No evidence of any cyanosis, clubbing, or peripheral edema.  +2 pedal and radial pulses bilaterally.  NEUROLOGIC: The patient is alert, awake, and oriented x3 with no focal motor or sensory deficits appreciated bilaterally.  SKIN: Moist and warm with no rashes appreciated.  Psych: Not anxious, depressed LN: No inguinal LN enlargement    Antibiotics   Anti-infectives    None      Medications   Scheduled Meds: . aspirin EC  81 mg Oral Daily  . atorvastatin  40 mg Oral q1800  . enoxaparin (LOVENOX) injection  40 mg Subcutaneous Q24H  . hydrALAZINE  100 mg Oral Q8H  . lisinopril  20 mg Oral Daily  . metoprolol  50 mg Oral BID  . potassium chloride  40 mEq Oral Once  . sodium chloride flush  3 mL Intravenous Q12H   Continuous Infusions: PRN Meds:.sodium chloride, acetaminophen, fentaNYL (SUBLIMAZE) injection, hydrALAZINE, hydrALAZINE, morphine injection, nitroGLYCERIN, ondansetron (ZOFRAN) IV, sodium chloride flush   Data Review:   Micro Results Recent Results (from the past 240 hour(s))  MRSA PCR Screening     Status: None   Collection Time: 06/14/16 10:32 PM  Result Value Ref Range Status   MRSA by PCR NEGATIVE NEGATIVE Final    Comment:        The GeneXpert MRSA Assay (FDA approved for NASAL  specimens only), is one component of a comprehensive MRSA colonization surveillance program. It is not intended to diagnose MRSA infection nor to guide or monitor treatment for MRSA infections.     Radiology Reports Dg Chest 2 View  Result Date: 06/14/2016 CLINICAL DATA:  Chest pain and shortness of breath EXAM: CHEST  2 VIEW COMPARISON:  October 17, 2015 FINDINGS: There is stable interstitial prominence in the bases, likely representing a degree of fibrotic type change. No edema or consolidation evident. Heart size and pulmonary vascularity are normal. No adenopathy. No bone lesions. No pneumothorax. IMPRESSION: Stable interstitial thickening in the bases, likely fibrotic type change. No frank edema or consolidation. Stable cardiac silhouette. Electronically Signed   By: Bretta BangWilliam  Woodruff III M.D.   On: 06/14/2016 13:44     CBC  Recent Labs Lab 06/14/16 1312 06/16/16 0513  WBC 5.1 6.8  HGB 15.0 15.2  HCT 43.2 46.5  PLT 207 219  MCV 86.2 87.9  MCH 30.0 28.7  MCHC 34.8 32.7  RDW 13.7 13.6    Chemistries   Recent Labs Lab 06/14/16 1312 06/15/16 0802 06/16/16 0513  NA 141 140 140  K 3.2* 3.8 4.0  CL 106 105 105  CO2 29 27 28   GLUCOSE 88 114* 106*  BUN 25* 24* 28*  CREATININE 1.82* 1.55* 1.85*  CALCIUM 8.5* 8.9 8.7*   ------------------------------------------------------------------------------------------------------------------ estimated creatinine clearance is 60.7 mL/min (by C-G formula based on SCr of 1.85 mg/dL (H)). ------------------------------------------------------------------------------------------------------------------ No results for input(s): HGBA1C in the last 72 hours. ------------------------------------------------------------------------------------------------------------------  Recent Labs  06/15/16 0802  CHOL 134  HDL 35*  LDLCALC 82  TRIG 83  CHOLHDL 3.8    ------------------------------------------------------------------------------------------------------------------ No results for input(s): TSH, T4TOTAL, T3FREE, THYROIDAB in the last 72 hours.  Invalid input(s): FREET3 ------------------------------------------------------------------------------------------------------------------ No results for input(s): VITAMINB12, FOLATE, FERRITIN, TIBC, IRON, RETICCTPCT in the last 72 hours.  Coagulation profile  Recent Labs Lab 06/14/16 1615  INR 0.98    No results for input(s): DDIMER in the last 72 hours.  Cardiac Enzymes  Recent Labs Lab 06/14/16 1952 06/15/16 0121 06/15/16 0802  TROPONINI 0.09* 0.09* 0.08*   ------------------------------------------------------------------------------------------------------------------ Invalid input(s): POCBNP    Assessment & Plan  Patient is a 51 year old presenting with chest pain at surgery hypertension  1.   Accelerated hypertension We will increase his hydralazine back to 100 mg. 2 new lisinopril  2  Chest pain with moderate risk for cardiac etiology stress test results pending 3  Acute on chronic diastolic CHF (congestive heart failure) (HCC) improved with IV Lasix change oral IV Lasix 4.   CKD (chronic kidney disease), stage III renal function         Code Status Orders        Start     Ordered   06/14/16 1930  Full code  Continuous     06/14/16 1929    Code Status History    Date Active Date Inactive Code Status Order ID Comments User Context   10/17/2015 10:07 PM 10/21/2015  3:59 AM Full Code [id number]  Oralia Manis, MD Inpatient           Consults  Cardiology DVT Prophylaxis  Lovenox Lab Results  Component Value Date   PLT 219 06/16/2016     Time Spent in minutes   51m,in Greater than 50% of time spent in care coordination and counseling patient regarding the condition and plan of care.   Auburn Bilberry M.D on 06/16/2016 at 1:07 PM  Between 7am to  6pm - Pager - [phone]  After 6pm go to [email or web address] - password [email or web address] [facility]  [facility] [place] [facility]   Office  [phone]

## 2016-06-16 NOTE — Progress Notes (Signed)
Patient Name: Trevor Nunez Date of Encounter: 06/16/2016  Primary Cardiologist: [facility] Problem List     Principal Problem:   Accelerated hypertension Active Problems:   Acute on chronic diastolic CHF (congestive heart failure) (HCC)   Chest pain with moderate risk for cardiac etiology   H/O medication noncompliance   CKD (chronic kidney disease), stage III   Demand ischemia (HCC)     Subjective   No acute overnight events. No further chest pain. He is for nuclear stress test this morning. BP continued to improve on 2/27 leading to the decreasing of hydralazine to 50 mg q 8 hours from 100 mg q 8 hours as well as holding of clonidine. BP elevated again this morning into the 170's/100's. Asymptomatic.   Inpatient Medications    Scheduled Meds: . aspirin EC  81 mg Oral Daily  . atorvastatin  40 mg Oral q1800  . enoxaparin (LOVENOX) injection  40 mg Subcutaneous Q24H  . furosemide  40 mg Intravenous Q12H  . hydrALAZINE  100 mg Oral Q8H  . lisinopril  20 mg Oral Daily  . metoprolol  50 mg Oral BID  . potassium chloride  40 mEq Oral Once  . sodium chloride flush  3 mL Intravenous Q12H   Continuous Infusions:  PRN Meds: sodium chloride, acetaminophen, fentaNYL (SUBLIMAZE) injection, hydrALAZINE, hydrALAZINE, morphine injection, nitroGLYCERIN, ondansetron (ZOFRAN) IV, sodium chloride flush, technetium tetrofosmin   Vital Signs    Vitals:   06/16/16 0500 06/16/16 0600 06/16/16 0700 06/16/16 0800  BP: (!) 151/94 (!) 172/108 (!) 186/128 (!) 136/99  Pulse: 64  (!) 59 65  Resp: (!) 8 (!) 25 19 14   Temp:    98.4 F (36.9 C)  TempSrc:    Oral  SpO2: 92%  97% 95%  Weight:      Height:        Intake/Output Summary (Last 24 hours) at 06/16/16 1011 Last data filed at 06/16/16 0800  Gross per 24 hour  Intake              240 ml  Output             1050 ml  Net             -810 ml   Filed Weights   06/14/16 1311  Weight: 250 lb (113.4 kg)    Physical Exam    GEN: Well nourished, well developed, in no acute distress.  HEENT: Grossly normal.  Neck: Supple, no JVD, carotid bruits, or masses. Cardiac: RRR, II/VI systolic murmur LLSB, no rubs, or gallops. No clubbing, cyanosis, edema.  Radials/DP/PT 2+ and equal bilaterally.  Respiratory:  Respirations regular and unlabored, clear to auscultation bilaterally. GI: Soft, nontender, nondistended, BS + x 4. MS: no deformity or atrophy. Skin: warm and dry, no rash. Neuro:  Strength and sensation are intact. Psych: AAOx3.  Normal affect.  Labs    CBC  Recent Labs  06/14/16 1312 06/16/16 0513  WBC 5.1 6.8  HGB 15.0 15.2  HCT 43.2 46.5  MCV 86.2 87.9  PLT 207 219   Basic Metabolic Panel  Recent Labs  06/15/16 0802 06/16/16 0513  NA 140 140  K 3.8 4.0  CL 105 105  CO2 27 28  GLUCOSE 114* 106*  BUN 24* 28*  CREATININE 1.55* 1.85*  CALCIUM 8.9 8.7*   Liver Function Tests No results for input(s): AST, ALT, ALKPHOS, BILITOT, PROT, ALBUMIN in the last 72 hours. No results for input(s): LIPASE,  AMYLASE in the last 72 hours. Cardiac Enzymes  Recent Labs  06/14/16 1952 06/15/16 0121 06/15/16 0802  TROPONINI 0.09* 0.09* 0.08*   BNP Invalid input(s): POCBNP D-Dimer No results for input(s): DDIMER in the last 72 hours. Hemoglobin A1C No results for input(s): HGBA1C in the last 72 hours. Fasting Lipid Panel  Recent Labs  06/15/16 0802  CHOL 134  HDL 35*  LDLCALC 82  TRIG 83  CHOLHDL 3.8   Thyroid Function Tests No results for input(s): TSH, T4TOTAL, T3FREE, THYROIDAB in the last 72 hours.  Invalid input(s): FREET3  Telemetry    Sinus bradycardia, 50's to sinus rhythm 60's - Personally Reviewed  ECG    n/a - Personally Reviewed  Radiology    Dg Chest 2 View  Result Date: 06/14/2016 CLINICAL DATA:  Chest pain and shortness of breath EXAM: CHEST  2 VIEW COMPARISON:  October 17, 2015 FINDINGS: There is stable interstitial prominence in the bases, likely representing  a degree of fibrotic type change. No edema or consolidation evident. Heart size and pulmonary vascularity are normal. No adenopathy. No bone lesions. No pneumothorax. IMPRESSION: Stable interstitial thickening in the bases, likely fibrotic type change. No frank edema or consolidation. Stable cardiac silhouette. Electronically Signed   By: Bretta Bang III M.D.   On: 06/14/2016 13:44    Cardiac Studies   TTE 06/15/2016: Study Conclusions  - Left ventricle: The cavity size was normal. Wall thickness was   increased in a pattern of severe LVH. Systolic function was   vigorous. The estimated ejection fraction was in the range of 65%   to 70%. Regional wall motion abnormalities cannot be excluded.   Features are consistent with a pseudonormal left ventricular   filling pattern, with concomitant abnormal relaxation and   increased filling pressure (grade 2 diastolic dysfunction). - Aortic valve: Structurally normal valve. There was trivial   regurgitation. - Left atrium: The atrium was mildly dilated. - Right ventricle: The cavity size was normal. Wall thickness was   mildly increased. Systolic function was normal. - Right atrium: The atrium was mildly to moderately dilated.   Myoview pending.  Patient Profile     51 y.o. male with history of poorly controlled hypertension for at least 20 years with hypertensive heart disease, medication noncompliance, chronic diastolic heart failure, CKD stage III, and ongoing tobacco abuse who presented to [facility] on 2/26 with a three-day history of intermittent chest pain, shortness of breath, chest congestion, nasal congestion, cough productive of yellow sputum.  Assessment & Plan    1. Elevated troponin: -Minimally elevated at peak of 0.1 and down trending -Likely supply demand ischemia in the setting of his poorly controlled blood pressure with a systolic of 205 mmHg and CKD stage III -Currently without chest pain  -Echo showed hyperdynamic EF as  above -He is for Lexiscan Myoview this morning to evaluate for high-risk ischemia -Continue ASA 81 mg   2. Accelerated hypertension: -Presented with systolic blood pressure greater than 200 mmHg,  -BP improved throughout the day on 2/27 to the low 100's systolic. Because of this, his hydralazine was decreased to 50 mg q 8 hours from 100 mg q 8 hours by cardiology. IM stopped clonidine as well -BP elevated again this morning into the 170's systolic -Back on hydralazine 161 gm q 8 hours -Will add Imdur 30 mg daily and titrate up -Would ideally like to avoid frequent dosing medications given compliance is an issue for him, though for now will likely need hydralazine -  Continue Lopressor and lisinopril  -Bradycardia precluded further titration of Lopressor at this time -Clonidine discontinued as above, currently bradycardic so will not restart this at this time -Stop IV Lasix given worsening renal function -It has been felt he should avoid calcium channel blocker given chronic lower extremity edema -Compliance is crucial for him -Recommended outpatient renal artery ultrasound to evaluate for renal artery stenosis given his prolonged history of hypertension that has been poorly controlled, though with improvement seen inpatient this is likely to be negative for acute RAS as it appears compliance is his main issue  3. Mild acute on chronic diastolic CHF: -Likely in the setting of poorly controlled hypertension as above -BNP is minimally elevated at 169, chest x-ray without evidence of vascular congestion or effusion -Echo as above -Stop Lasix as above with worsening of renal function -May need prn Lasix as an outpatient  -Will need follow-up in the CHF clinic  4. Acute on CKD stage III: -Stop Lasix as above -Monitor renal function  5. History of noncompliance presenting significant hazards to health: -Compliance is advised  6. Tobacco abuse: -Cessation is advised  7. URI: -Cough  with yellow sputum and nasal congestion -Defer to IM   Signed, Eula Listenyan Dunn, PA-C [facility] Pager: [phone] 06/16/2016, 10:11 AM    Attending Note Patient seen and examined, agree with detailed note above,  Patient presentation and plan discussed on rounds.   Stress test today Results discussed with him, no significant ischemia noted Baseline T-wave abnormality on EKG Mildly depressed ejection fraction likely secondary to GI uptake artifact  Overall he feels well, blood pressure mildly elevated after holding clonidine, decreasing hydralazine Denies significant shortness of breath, he has some atypical type chest pain Has not been ambulating, still in the intensive care unit  On physical exam lungs are clear to auscultation, heart sounds regular, no JVD, abdomen obese soft nontender, no significant lower extremity edema  ----Hypertension Blood pressure mildly elevated,  unclear if he will take hydralazine 3 times a day given history of noncompliance If blood pressure continues to run high, could restart clonidine, or change the hydralazine to clonidine for simplicity  --- Chest pain Atypical in nature,  stress test showing no ischemia , no further workup at this time Would continue aspirin, statin  --- Chronic renal insufficiency Likely secondary to long history of poor control hypertension Stressed importance of medication compliance  DISPO He could be moved to telemetry floor, no further workup needed from cardiac perspective  Greater than 50% was spent in counseling and coordination of care with patient Total encounter time 25 minutes or more   Signed: Dossie Arbourim Brallan Denio  M.D., Ph.D. [facility]

## 2016-06-17 LAB — BASIC METABOLIC PANEL
Anion gap: 8 (ref 5–15)
BUN: 30 mg/dL — AB (ref 6–20)
CHLORIDE: 104 mmol/L (ref 101–111)
CO2: 26 mmol/L (ref 22–32)
CREATININE: 1.71 mg/dL — AB (ref 0.61–1.24)
Calcium: 8.7 mg/dL — ABNORMAL LOW (ref 8.9–10.3)
GFR calc Af Amer: 51 mL/min — ABNORMAL LOW (ref >60)
GFR calc non Af Amer: 44 mL/min — ABNORMAL LOW (ref >60)
GLUCOSE: 115 mg/dL — AB (ref 65–99)
Potassium: 3.7 mmol/L (ref 3.5–5.1)
SODIUM: 138 mmol/L (ref 135–145)

## 2016-06-17 MED ORDER — LISINOPRIL 20 MG PO TABS: 20.0000 mg | ORAL_TABLET | Freq: Every day | ORAL | 0 refills | Status: DC

## 2016-06-17 MED ORDER — HYDRALAZINE HCL 50 MG PO TABS: 100.0000 mg | ORAL_TABLET | Freq: Three times a day (TID) | ORAL | 1 refills | Status: DC

## 2016-06-17 NOTE — Discharge Summary (Signed)
[facility] [place] at [facility], [place] y.o., DOB 1966-01-10, MRN [id number]. Admission date: 06/14/2016 Discharge Date 06/17/2016 Primary MD No PCP Per Patient Admitting Physician Ihor Austin, MD  Admission Diagnosis  Hypertensive urgency [I16.0] Elevated troponin I level [R74.8] Chest pain, unspecified type [R07.9]  Discharge Diagnosis   Principal Problem:   Accelerated hypertension  Chest pain with moderate risk for cardiac etiology  Acute on chronic diastolic CHF (congestive heart failure) (HCC)  H/O medication noncompliance  CKD (chronic kidney disease), stage III  Demand ischemia [facility])      Hospital Course   Patient is a 51 year old with history of congestive heart failure and hypertension who presented with complaining of chest pain. He was noted to have accelerated blood pressure. He was admitted to the ICU and his blood pressure medications were adjusted with improvement in his blood pressure. He was also noticed to have acute on chronic diastolic CHF. Which was treated with IV Lasix with improvement in his symptoms. Patient also had noted to have elevated troponin which was felt to be due to demand ischemia. He underwent a stress test which was a low risk scan according to cardiology they recommended management of his blood pressure. I have encouraged patient to take his medications and eat a low-sodium diet.           Consults  cardiology  Significant Tests:  See full reports for all details    Dg Chest 2 View  Result Date: 06/14/2016 CLINICAL DATA:  Chest pain and shortness of breath EXAM: CHEST  2 VIEW COMPARISON:  October 17, 2015 FINDINGS: There is stable interstitial prominence in the bases, likely representing a degree of fibrotic type change. No edema or consolidation evident. Heart size and pulmonary vascularity are normal. No adenopathy. No bone lesions. No pneumothorax. IMPRESSION: Stable interstitial thickening in the bases, likely  fibrotic type change. No frank edema or consolidation. Stable cardiac silhouette. Electronically Signed   By: Bretta Bang III M.D.   On: 06/14/2016 13:44   Nm Myocar Multi W/spect W/wall Motion / Ef  Result Date: 06/16/2016 Pharmacological myocardial perfusion imaging study with no significant  ischemia Normal wall motion, EF estimated at 43%. GI uptake artifact noted No EKG changes concerning for ischemia at peak stress or in recovery. Resting EKG with PVCs, T wave abn in V5, V6, II, III, AVF Low risk scan Signed, Dossie Arbour, MD, Ph.D [facility]       Today   Subjective:   Trevor Nunez  Feels better no chest pain blood pressure stable  Objective:   Blood pressure (!) 148/96, pulse (!) 59, temperature 97.9 F (36.6 C), temperature source Oral, resp. rate 15, height 6' (1.829 m), weight 250 lb (113.4 kg), SpO2 98 %.  .  Intake/Output Summary (Last 24 hours) at 06/17/16 1250 Last data filed at 06/17/16 0853  Gross per 24 hour  Intake              723 ml  Output              775 ml  Net              -52 ml    Exam VITAL SIGNS: Blood pressure (!) 148/96, pulse (!) 59, temperature 97.9 F (36.6 C), temperature source Oral, resp. rate 15, height 6' (1.829 m), weight 250 lb (113.4 kg), SpO2 98 %.  GENERAL:  51 y.o.-year-old patient lying in the bed with no acute distress.  EYES:  Pupils equal, round, reactive to light and accommodation. No scleral icterus. Extraocular muscles intact.  HEENT: Head atraumatic, normocephalic. Oropharynx and nasopharynx clear.  NECK:  Supple, no jugular venous distention. No thyroid enlargement, no tenderness.  LUNGS: Normal breath sounds bilaterally, no wheezing, rales,rhonchi or crepitation. No use of accessory muscles of respiration.  CARDIOVASCULAR: S1, S2 normal. No murmurs, rubs, or gallops.  ABDOMEN: Soft, nontender, nondistended. Bowel sounds present. No organomegaly or mass.  EXTREMITIES: No pedal edema, cyanosis, or clubbing.   NEUROLOGIC: Cranial nerves II through XII are intact. Muscle strength 5/5 in all extremities. Sensation intact. Gait not checked.  PSYCHIATRIC: The patient is alert and oriented x 3.  SKIN: No obvious rash, lesion, or ulcer.   Data Review     CBC w Diff: Lab Results  Component Value Date   WBC 6.8 06/16/2016   HGB 15.2 06/16/2016   HCT 46.5 06/16/2016   PLT 219 06/16/2016   LYMPHOPCT 27 10/17/2015   MONOPCT 8 10/17/2015   EOSPCT 3 10/17/2015   BASOPCT 1 10/17/2015   CMP: Lab Results  Component Value Date   NA 138 06/17/2016   K 3.7 06/17/2016   CL 104 06/17/2016   CO2 26 06/17/2016   BUN 30 (H) 06/17/2016   CREATININE 1.71 (H) 06/17/2016   PROT 7.3 10/17/2015   ALBUMIN 3.9 10/17/2015   BILITOT 0.5 10/17/2015   ALKPHOS 83 10/17/2015   AST 38 10/17/2015   ALT 43 10/17/2015  .  Micro Results Recent Results (from the past 240 hour(s))  MRSA PCR Screening     Status: None   Collection Time: 06/14/16 10:32 PM  Result Value Ref Range Status   MRSA by PCR NEGATIVE NEGATIVE Final    Comment:        The GeneXpert MRSA Assay (FDA approved for NASAL specimens only), is one component of a comprehensive MRSA colonization surveillance program. It is not intended to diagnose MRSA infection nor to guide or monitor treatment for MRSA infections.         Code Status Orders        Start     Ordered   06/14/16 1930  Full code  Continuous     06/14/16 1929    Code Status History    Date Active Date Inactive Code Status Order ID Comments User Context   10/17/2015 10:07 PM 10/21/2015  3:59 AM Full Code [id number]  Oralia Manisavid Willis, MD Inpatient          Follow-up Information    Lamont DowdyKOLLURU, SARATH, MD. Go on 08/09/2016.   Specialty:  Internal Medicine Why:  crf, Appointment Time: 10:40am Contact information: [address] [place] [address] [phone]        [facility] up in 1 week(s).   Specialty:  General Practice Why:   as new patient elevated bp Contact information: [address] [place] [address] [phone]        Delma Freezeina A Hackney, FNP. Go on 06/25/2016.   Specialty:  Family Medicine Why:  at 9:00am to the Heart Failure Clinic Contact information: [address] [place] [address] [phone]           Discharge Medications   Allergies as of 06/17/2016      Reactions   Codeine Palpitations      Medication List    STOP taking these medications   cloNIDine 0.1 MG tablet Commonly known as:  CATAPRES  TAKE these medications   furosemide 40 MG tablet Commonly known as:  LASIX Take 1 tablet (40 mg total) by mouth daily.   hydrALAZINE 50 MG tablet Commonly known as:  APRESOLINE Take 2 tablets (100 mg total) by mouth every 8 (eight) hours. What changed:  how much to take   HYDROcodone-acetaminophen 5-325 MG tablet Commonly known as:  NORCO/VICODIN Take 1-2 tablets by mouth every 4 (four) hours as needed for moderate pain.   lisinopril 20 MG tablet Commonly known as:  PRINIVIL,ZESTRIL Take 1 tablet (20 mg total) by mouth daily.   metoprolol 50 MG tablet Commonly known as:  LOPRESSOR Take 1 tablet (50 mg total) by mouth 2 (two) times daily.   nitroGLYCERIN 0.4 MG SL tablet Commonly known as:  NITROSTAT Place 1 tablet (0.4 mg total) under the tongue every 5 (five) minutes as needed for chest pain.          Total Time in preparing paper work, data evaluation and todays exam - 35 minutes  Auburn Bilberry M.D on 06/17/2016 at 12:50 PM  [facility]   Office  [phone]

## 2016-06-17 NOTE — Progress Notes (Signed)
Pt discharged to home via wc.  Instructions and rx given to pt.  Questions answered.  No distress.  

## 2016-06-17 NOTE — Progress Notes (Signed)
Initial HF Clinic appointment scheduled on June 25, 2016 at 9:00am. Thank you.

## 2016-06-17 NOTE — Progress Notes (Signed)
[facility] - [place] at [facility]    Trevor Nunez was admitted to the Hospital on 06/14/2016 and Discharged  06/17/2016 and should be excused from work/school   for 5 days starting 06/14/2016 , may return to work/school without any restrictions.  Call Trevor BilberryShreyang Eloni Darius MD with questions.  Trevor BilberryPATEL, Trevor Sunde M.D on 06/17/2016,at 8:27 AM  [facility] [place] [facility]    Office  [phone]

## 2016-06-17 NOTE — Progress Notes (Signed)
Trevor Nunez, Trevor Nunez transferred from the CCU. On admission to the unit he was ambulatory, A&O X4 denied pain , patient was oriented to the unit and needed item placed within patient's reach.  06/16/2016 2036: 0.4mg  sublingual nitro given for c/o chest pain. Patient stated pain relieved . Patient has no acute event overnight, maintain HR 55-65. With stable VS.

## 2016-06-17 NOTE — Care Management (Signed)
Informed that patient has no problems obtaining his medications.  Current discharge meds on the 4 dollar list.  [facility] application

## 2016-06-17 NOTE — Progress Notes (Signed)
    Patient not in room upon rounding. Will try back later prior to discharge.

## 2016-06-17 NOTE — Discharge Instructions (Addendum)
Heart Failure Clinic appointment on June 25, 2016 at 9:00am with Clarisa Kindredina Alahni Varone, FNP. Please call [phone] to reschedule.    [facility] - [place] at [facility]  DIET:  Cardiac diet  DISCHARGE CONDITION:  Stable  ACTIVITY:  Activity as tolerated  OXYGEN:  Home Oxygen: No.   Oxygen Delivery: room air  DISCHARGE LOCATION:  home    ADDITIONAL DISCHARGE INSTRUCTION: please take all meds, watch salt intake   If you experience worsening of your admission symptoms, develop shortness of breath, life threatening emergency, suicidal or homicidal thoughts you must seek medical attention immediately by calling 911 or calling your MD immediately  if symptoms less severe.  You Must read complete instructions/literature along with all the possible adverse reactions/side effects for all the Medicines you take and that have been prescribed to you. Take any new Medicines after you have completely understood and accpet all the possible adverse reactions/side effects.   Please note  You were cared for by a hospitalist during your hospital stay. If you have any questions about your discharge medications or the care you received while you were in the hospital after you are discharged, you can call the unit and asked to speak with the hospitalist on call if the hospitalist that took care of you is not available. Once you are discharged, your primary care physician will handle any further medical issues. Please note that NO REFILLS for any discharge medications will be authorized once you are discharged, as it is imperative that you return to your primary care physician (or establish a relationship with a primary care physician if you do not have one) for your aftercare needs so that they can reassess your need for medications and monitor your lab values.

## 2016-06-25 ENCOUNTER — Encounter: Payer: Self-pay | Admitting: Family

## 2016-06-25 ENCOUNTER — Telehealth: Payer: Self-pay | Admitting: Cardiovascular Disease

## 2016-06-25 ENCOUNTER — Ambulatory Visit: Payer: Self-pay | Attending: Family | Admitting: Family

## 2016-06-25 VITALS — BP 180/119 | HR 70 | Resp 18 | Ht 72.0 in | Wt 258.5 lb

## 2016-06-25 DIAGNOSIS — R0789 Other chest pain: Secondary | ICD-10-CM

## 2016-06-25 DIAGNOSIS — Z87891 Personal history of nicotine dependence: Secondary | ICD-10-CM | POA: Insufficient documentation

## 2016-06-25 DIAGNOSIS — I444 Left anterior fascicular block: Secondary | ICD-10-CM | POA: Insufficient documentation

## 2016-06-25 DIAGNOSIS — I10 Essential (primary) hypertension: Secondary | ICD-10-CM

## 2016-06-25 DIAGNOSIS — R001 Bradycardia, unspecified: Secondary | ICD-10-CM | POA: Insufficient documentation

## 2016-06-25 DIAGNOSIS — R5383 Other fatigue: Secondary | ICD-10-CM | POA: Insufficient documentation

## 2016-06-25 DIAGNOSIS — R0683 Snoring: Secondary | ICD-10-CM

## 2016-06-25 DIAGNOSIS — Z79899 Other long term (current) drug therapy: Secondary | ICD-10-CM | POA: Insufficient documentation

## 2016-06-25 DIAGNOSIS — I5032 Chronic diastolic (congestive) heart failure: Secondary | ICD-10-CM

## 2016-06-25 DIAGNOSIS — I4581 Long QT syndrome: Secondary | ICD-10-CM | POA: Insufficient documentation

## 2016-06-25 DIAGNOSIS — R079 Chest pain, unspecified: Secondary | ICD-10-CM | POA: Insufficient documentation

## 2016-06-25 DIAGNOSIS — I130 Hypertensive heart and chronic kidney disease with heart failure and stage 1 through stage 4 chronic kidney disease, or unspecified chronic kidney disease: Secondary | ICD-10-CM | POA: Insufficient documentation

## 2016-06-25 DIAGNOSIS — N183 Chronic kidney disease, stage 3 (moderate): Secondary | ICD-10-CM | POA: Insufficient documentation

## 2016-06-25 MED ORDER — ISOSORB DINITRATE-HYDRALAZINE 20-37.5 MG PO TABS: 1.0000 | ORAL_TABLET | Freq: Three times a day (TID) | ORAL | 3 refills | Status: DC

## 2016-06-25 NOTE — Telephone Encounter (Signed)
Left voicemail message to call back  

## 2016-06-25 NOTE — Telephone Encounter (Signed)
Trevor Nunez in HF spoke with Dr. Mariah MillingGollan

## 2016-06-25 NOTE — Telephone Encounter (Signed)
Patient is With Trevor Nunez in HF clinic now.  Patient has cp elevated bp and some ekg changes.  Please call Trevor Nunez in HF to discuss [phone].

## 2016-06-25 NOTE — Patient Instructions (Addendum)
Begin weighing daily and call for an overnight weight gain of > 2 pounds or a weekly weight gain of >5 pounds.   Stop hydralazine. Begin BiDil 1 tablet three times daily   [facility]   [phone]  [address]  [place] [address]  Hours: Tuesday: 4:15 pm - 7:30 pm  Wednesday: 9 am - 1 pm  Thursday: 1 pm - 7:30 pm  Friday - Monday: CLOSED  Medication Management  [phone]  [facility] [address], [address] [place], [place] [address]   Medication Pick-up Hours: Monday: 8:30 a.m. - 11:30 a.m.  1:00 p.m. - 4:00 p.m. Tuesday:  Medication pick-up available by appt. only Wednesday: 8:30 a.m. - 4:30 p.m. Thursday: 8:30 a.m. - 11:30 a.m. Friday: 8:30 a.m. - 11:30 a.m. (Closed during lunch 11:30am-1:00pm except Wednesdays) Business Hours: Monday-Thursday 8:30 a.m. - 4:30 p.m. Friday: 8:30 a.m. - 11:30 a.m. (Closed during lunch 11:30 a.m. - 1:00 p.m. except Wednesdays)  Financial Assistance  [phone]

## 2016-06-25 NOTE — Progress Notes (Signed)
Subjective:    Patient ID: Trevor Nunez, male    DOB: 02-20-1966, 51 y.o.   MRN: [id number]  HPI   Trevor Nunez is a 51 y/o male with a history of HTN, CKD (stage III), previous tobacco use and chronic heart failure.  Last echo was done 227/8 and showed an EF of 65-70% along with trivial AR and mildly increased right ventricle wall thickness. This is unchanged from previous one back in July 2017.  Admitted 06/14/16 with HTN and HF exacerbation along with chest pain. Initially treated with IV diuretics. Had an elevated troponin thought to be due to demand ischemia. Had a stress test which was read as low risk by cardiology. Discharged home. Admitted 10/17/15 with HTN and HF exacerbation along with chest pain. Elevated troponin thought to be due to demand ischemia. Cardiology consult done. Discharged home.   He presents today for his initial visit with fatigue and shortness of breath with moderate exertion. Also has swelling in his lower legs. Biggest concern is of some constant chest pain that he's had since last night with radiation across his left upper chest and then down into his left arm. Did take 1 SL NTG last night with slight improvement of pain. Does not have scales. Currently uninsured. Does endorse snoring.   Past Medical History:  Diagnosis Date  . Chronic diastolic CHF (congestive heart failure) (HCC)   . CKD (chronic kidney disease), stage III   . H/O medication noncompliance   . Hypertension    Past Surgical History:  Procedure Laterality Date  . CHOLECYSTECTOMY    . HERNIA REPAIR     Family History  Problem Relation Age of Onset  . Hypertension Mother   . Heart attack Mother   . CVA Mother   . Heart disease Sister    Social History  Substance Use Topics  . Smoking status: Former Smoker    Quit date: 08/17/2015  . Smokeless tobacco: Never Used  . Alcohol use No    Allergies  Allergen Reactions  . Codeine Palpitations    Prior to Admission medications     Medication Sig Start Date End Date Taking? Authorizing Provider  furosemide (LASIX) 40 MG tablet Take 1 tablet (40 mg total) by mouth daily. 10/20/15  Yes Enedina FinnerSona Patel, MD  hydrALAZINE (APRESOLINE) 50 MG tablet Take 2 tablets (100 mg total) by mouth every 8 (eight) hours. 06/17/16  Yes Auburn BilberryShreyang Patel, MD  lisinopril (PRINIVIL,ZESTRIL) 20 MG tablet Take 1 tablet (20 mg total) by mouth daily. 06/17/16  Yes Auburn BilberryShreyang Patel, MD  metoprolol (LOPRESSOR) 50 MG tablet Take 1 tablet (50 mg total) by mouth 2 (two) times daily. 10/20/15  Yes Enedina FinnerSona Patel, MD  nitroGLYCERIN (NITROSTAT) 0.4 MG SL tablet Place 1 tablet (0.4 mg total) under the tongue every 5 (five) minutes as needed for chest pain. 10/20/15  Yes Enedina FinnerSona Patel, MD   Review of Systems  Constitutional: Positive for fatigue. Negative for appetite change.  HENT: Negative for congestion, postnasal drip and sore throat.   Eyes: Negative.   Respiratory: Positive for shortness of breath. Negative for cough and chest tightness.   Cardiovascular: Positive for chest pain, palpitations and leg swelling.  Gastrointestinal: Negative for abdominal distention and abdominal pain.  Endocrine: Negative.   Genitourinary: Negative.   Musculoskeletal: Positive for arthralgias (left shoulder/arm). Negative for back pain.  Skin: Negative.   Allergic/Immunologic: Negative.   Neurological: Positive for dizziness (yesterday) and headaches. Negative for light-headedness.  Hematological: Negative for adenopathy. Does not  bruise/bleed easily.  Psychiatric/Behavioral: Positive for sleep disturbance (not sleeping well). Negative for dysphoric mood and suicidal ideas. The patient is not nervous/anxious.    Vitals:   06/25/16 0915  BP: (!) 180/119  Pulse: 70  Resp: 18  SpO2: 97%  Weight: 258 lb 8 oz (117.3 kg)  Height: 6' (1.829 m)   Wt Readings from Last 3 Encounters:  06/25/16 258 lb 8 oz (117.3 kg)  06/14/16 250 lb (113.4 kg)  10/20/15 275 lb 8 oz (125 kg)   Lab Results   Component Value Date   CREATININE 1.71 (H) 06/17/2016   CREATININE 1.85 (H) 06/16/2016   CREATININE 1.55 (H) 06/15/2016      Objective:   Physical Exam  Constitutional: He is oriented to person, place, and time. He appears well-developed and well-nourished.  HENT:  Head: Normocephalic and atraumatic.  Eyes: Conjunctivae are normal. Pupils are equal, round, and reactive to light.  Neck: Normal range of motion. Neck supple. No JVD present.  Cardiovascular: Normal rate and regular rhythm.   Pulmonary/Chest: Effort normal. He has no wheezes. He has no rales.  Abdominal: Soft. He exhibits no distension. There is no tenderness.  Musculoskeletal: He exhibits edema (1+ pitting edema in bilateral lower legs).       Left shoulder: He exhibits tenderness (to palpation).       Arms: Neurological: He is alert and oriented to person, place, and time.  Skin: Skin is warm and dry.  Psychiatric: He has a normal mood and affect. His behavior is normal. Thought content normal.  Nursing note and vitals reviewed.  Assessment & Plan:  1: Chronic heart failure with preserved ejection fraction- - NYHA class II - euvolemic - does not have scales so a set of scales was given to him today. Instructed him to weigh daily, write the weight down and call for an overnight weight gain of >2 pounds or a weekly weight gain of >5 pounds. - not adding salt; encouraged to read food labels. Written dietary information given to him about a 2000mg  sodium diet - instructed him to elevate his legs when he can. Works as a Scientist, clinical (histocompatibility and immunogenetics) and will be getting ready to go to 12 hour shifts. Note written for him to work day shift as opposed to night shift  2: HTN- - bp elevated - patient says that he's been taking all of his medications as prescribed - will stop his hydralazine and begin bidil three times daily; 48 samples of bidil given - information given for [facility] - information given for [facility]ication Management  Clinic  3: Chest wall pain- - EKG done today and overread by Dr. Mariah Milling - adding bidil per above so hopefully the isosorbide will decrease his chest pain -chest wall tender to palpation so could have musculoskeletal inflammation/pulled muscle component as well  4: Snoring- - endorses snoring and waking up tired - sleep study referral to be made - information given about Patient Financial Services to see if he qualifies for bill reduction  Medication list was reviewed.  Return here in one week or sooner for any questions/problems before then.        Assessment & Plan:

## 2016-07-05 ENCOUNTER — Telehealth: Payer: Self-pay | Admitting: Family

## 2016-07-05 ENCOUNTER — Ambulatory Visit: Payer: Self-pay | Admitting: Family

## 2016-07-05 NOTE — Telephone Encounter (Signed)
Patient did not show for his Heart Failure Clinic appointment on 07/05/16. Will attempt to reschedule.

## 2016-09-21 ENCOUNTER — Emergency Department: Payer: Medicaid Other

## 2016-09-21 ENCOUNTER — Encounter: Payer: Self-pay | Admitting: Emergency Medicine

## 2016-09-21 ENCOUNTER — Emergency Department
Admission: EM | Admit: 2016-09-21 | Discharge: 2016-09-21 | Disposition: A | Discharge status: Home / Self Care | Payer: Medicaid Other | Attending: Emergency Medicine | Admitting: Emergency Medicine

## 2016-09-21 DIAGNOSIS — I130 Hypertensive heart and chronic kidney disease with heart failure and stage 1 through stage 4 chronic kidney disease, or unspecified chronic kidney disease: Secondary | ICD-10-CM | POA: Insufficient documentation

## 2016-09-21 DIAGNOSIS — N183 Chronic kidney disease, stage 3 (moderate): Secondary | ICD-10-CM | POA: Diagnosis not present

## 2016-09-21 DIAGNOSIS — Z87891 Personal history of nicotine dependence: Secondary | ICD-10-CM | POA: Diagnosis not present

## 2016-09-21 DIAGNOSIS — J189 Pneumonia, unspecified organism: Secondary | ICD-10-CM | POA: Insufficient documentation

## 2016-09-21 DIAGNOSIS — I5032 Chronic diastolic (congestive) heart failure: Secondary | ICD-10-CM | POA: Diagnosis not present

## 2016-09-21 DIAGNOSIS — R05 Cough: Secondary | ICD-10-CM | POA: Diagnosis present

## 2016-09-21 LAB — COMPREHENSIVE METABOLIC PANEL
ALBUMIN: 4.2 g/dL (ref 3.5–5.0)
ALT: 27 U/L (ref 17–63)
ANION GAP: 7 (ref 5–15)
AST: 22 U/L (ref 15–41)
Alkaline Phosphatase: 89 U/L (ref 38–126)
BUN: 25 mg/dL — AB (ref 6–20)
CO2: 28 mmol/L (ref 22–32)
Calcium: 9.2 mg/dL (ref 8.9–10.3)
Chloride: 106 mmol/L (ref 101–111)
Creatinine, Ser: 2.02 mg/dL — ABNORMAL HIGH (ref 0.61–1.24)
GFR calc Af Amer: 42 mL/min — ABNORMAL LOW (ref >60)
GFR, EST NON AFRICAN AMERICAN: 36 mL/min — AB (ref >60)
Glucose, Bld: 99 mg/dL (ref 65–99)
POTASSIUM: 4.1 mmol/L (ref 3.5–5.1)
Sodium: 141 mmol/L (ref 135–145)
TOTAL PROTEIN: 7.9 g/dL (ref 6.5–8.1)
Total Bilirubin: 0.8 mg/dL (ref 0.3–1.2)

## 2016-09-21 LAB — CBC WITH DIFFERENTIAL/PLATELET
BASOS ABS: 0.1 10*3/uL (ref 0–0.1)
Basophils Relative: 1 %
Eosinophils Absolute: 0.3 10*3/uL (ref 0–0.7)
Eosinophils Relative: 5 %
HCT: 45.8 % (ref 40.0–52.0)
Hemoglobin: 15.2 g/dL (ref 13.0–18.0)
Lymphocytes Relative: 18 %
Lymphs Abs: 1.3 10*3/uL (ref 1.0–3.6)
MCH: 28.9 pg (ref 26.0–34.0)
MCHC: 33.1 g/dL (ref 32.0–36.0)
MCV: 87.4 fL (ref 80.0–100.0)
MONO ABS: 0.7 10*3/uL (ref 0.2–1.0)
Monocytes Relative: 10 %
Neutro Abs: 4.7 10*3/uL (ref 1.4–6.5)
Neutrophils Relative %: 66 %
Platelets: 217 10*3/uL (ref 150–440)
RBC: 5.24 MIL/uL (ref 4.40–5.90)
RDW: 13.7 % (ref 11.5–14.5)
WBC: 7 10*3/uL (ref 3.8–10.6)

## 2016-09-21 LAB — LACTIC ACID, PLASMA: LACTIC ACID, VENOUS: 1.2 mmol/L (ref 0.5–1.9)

## 2016-09-21 MED ORDER — ACETAMINOPHEN 500 MG PO TABS
1000.0000 mg | ORAL_TABLET | Freq: Once | ORAL | Status: AC
  Administered 2016-09-21: 1000 mg via ORAL
  Filled 2016-09-21: qty 2

## 2016-09-21 MED ORDER — LEVOFLOXACIN IN D5W 750 MG/150ML IV SOLN
750.0000 mg | Freq: Once | INTRAVENOUS | Status: AC
  Administered 2016-09-21: 750 mg via INTRAVENOUS
  Filled 2016-09-21: qty 150

## 2016-09-21 MED ORDER — LEVOFLOXACIN 500 MG PO TABS: 500.0000 mg | ORAL_TABLET | Freq: Every day | ORAL | 0 refills | Status: AC

## 2016-09-21 NOTE — ED Triage Notes (Signed)
Cough, headache and shortness of breath for one week. NAD noted in triage, skin warm and dry. VSS.

## 2016-09-21 NOTE — ED Notes (Signed)
See triage note  States he developed non prod cough about 1 week ago   Subjective fever but afebrile on arrival .   States he is having some exertional SOB and notices increased sob when lying flat

## 2016-09-21 NOTE — Discharge Instructions (Signed)
Continue antibiotics until completely finished. Follow-up with [facility] if any continued problems. Continue her regular medication at home. Strongly consider working with the congestive heart failure clinic here at [facility]. Call  [phone] and ask for the Heart Clinic  at extension [phone] Start Levaquin tomorrow as you had IV antibiotics today

## 2016-09-21 NOTE — ED Provider Notes (Signed)
[facility] Emergency Department Provider Note  ____________________________________________   First MD Initiated Contact with Patient 09/21/16 [phone]     (approximate)  I have reviewed the triage vital signs and the nursing notes.   HISTORY  Chief Complaint Cough; Headache; and Shortness of Breath   HPI Trevor Nunez is a 51 y.o. male is here with complaint of cough, headache, shortness of breath for one week. Patient is unaware if any fever or chills. He states that his cough is occasionally is productive. Patient denies any chest pain, nausea, vomiting, diaphoresis. Currently he states he is taking lisinopril and his "fluid pill". When asked to his PCP is he states it is Dr. Allena Katz in the emergency room. He states he does not have a PCP on the outside of the hospital. In looking through his records it appears the patient has been extremely noncompliant with medication and follow-up. He was given information about the CHF clinic at [facility] and made one appointment and then no showed for the remainder of the scheduled appointments.  Patient rates his discomfort as a 9/10.    Past Medical History:  Diagnosis Date  . Chronic diastolic CHF (congestive heart failure) (HCC)   . CKD (chronic kidney disease), stage III   . H/O medication noncompliance   . Hypertension     Patient Active Problem List   Diagnosis Date Noted  . Chest pain 06/25/2016  . Chronic diastolic heart failure (HCC) 06/25/2016  . Snoring 06/25/2016  . CKD (chronic kidney disease), stage III 06/15/2016  . Demand ischemia (HCC) 06/15/2016  . Pain and swelling of elbow   . Acute on chronic diastolic CHF (congestive heart failure) (HCC)   . H/O medication noncompliance   . Chest pain with moderate risk for cardiac etiology 10/17/2015  . Accelerated hypertension 10/17/2015  . AKI (acute kidney injury) (HCC) 10/17/2015    Past Surgical History:  Procedure Laterality Date  . CHOLECYSTECTOMY     . HERNIA REPAIR      Prior to Admission medications   Medication Sig Start Date End Date Taking? Authorizing Provider  furosemide (LASIX) 40 MG tablet Take 1 tablet (40 mg total) by mouth daily. 10/20/15   Enedina Finner, MD  isosorbide-hydrALAZINE (BIDIL) 20-37.5 MG tablet Take 1 tablet by mouth 3 (three) times daily. 06/25/16   Delma Freeze, FNP  levofloxacin (LEVAQUIN) 500 MG tablet Take 1 tablet (500 mg total) by mouth daily. 09/21/16 10/01/16  Tommi Rumps, PA-C  lisinopril (PRINIVIL,ZESTRIL) 20 MG tablet Take 1 tablet (20 mg total) by mouth daily. 06/17/16   Auburn Bilberry, MD  metoprolol (LOPRESSOR) 50 MG tablet Take 1 tablet (50 mg total) by mouth 2 (two) times daily. 10/20/15   Enedina Finner, MD  nitroGLYCERIN (NITROSTAT) 0.4 MG SL tablet Place 1 tablet (0.4 mg total) under the tongue every 5 (five) minutes as needed for chest pain. 10/20/15   Enedina Finner, MD    Allergies Codeine  Family History  Problem Relation Age of Onset  . Hypertension Mother   . Heart attack Mother   . CVA Mother   . Heart disease Sister     Social History Social History  Substance Use Topics  . Smoking status: Former Smoker    Quit date: 08/17/2015  . Smokeless tobacco: Never Used  . Alcohol use No    Review of Systems Constitutional: No fever/chills Cardiovascular: Denies chest pain. Respiratory: Positive shortness of breath. Positive cough. Gastrointestinal: No abdominal pain.  No nausea, no  vomiting.   Genitourinary: Negative for dysuria. Musculoskeletal: Negative for back pain. Skin: Negative for rash. Neurological: Negative for headaches, focal weakness or numbness.   ____________________________________________   PHYSICAL EXAM:  VITAL SIGNS: ED Triage Vitals  Enc Vitals Group     BP 09/21/16 0853 (!) 132/97     Pulse Rate 09/21/16 0853 83     Resp 09/21/16 0853 20     Temp 09/21/16 0853 97.8 F (36.6 C)     Temp Source 09/21/16 0853 Oral     SpO2 09/21/16 0853 100 %      Weight 09/21/16 0854 258 lb (117 kg)     Height --      Head Circumference --      Peak Flow --      Pain Score 09/21/16 0853 9     Pain Loc --      Pain Edu? --      Excl. in GC? --     Constitutional: Alert and oriented. Well appearing and in no acute distress. Eyes: Conjunctivae are normal. PERRL. EOMI. Head: Atraumatic. Nose: No congestion/rhinnorhea. Mouth/Throat: Mucous membranes are moist.  Oropharynx non-erythematous. Neck: No stridor. Hematological/Lymphatic/Immunilogical: No cervical lymphadenopathy. Cardiovascular: Normal rate, regular rhythm. Grossly normal heart sounds.  Good peripheral circulation. Respiratory: Normal respiratory effort.  No retractions. Lungs Bilateral expiratory wheeze infrequently heard that appears to clear with coughing. Gastrointestinal: Soft and nontender. No distention.  Musculoskeletal: Moves upper and lower extremities without difficulty. Normal gait was noted. Neurologic:  Normal speech and language. No gross focal neurologic deficits are appreciated. No gait instability. Skin:  Skin is warm, dry and intact. No rash noted. Psychiatric: Mood and affect are normal. Speech and behavior are normal.  ____________________________________________   LABS (all labs ordered are listed, but only abnormal results are displayed)  Labs Reviewed  COMPREHENSIVE METABOLIC PANEL - Abnormal; Notable for the following:       Result Value   BUN 25 (*)    Creatinine, Ser 2.02 (*)    GFR calc non Af Amer 36 (*)    GFR calc Af Amer 42 (*)    All other components within normal limits  CBC WITH DIFFERENTIAL/PLATELET  LACTIC ACID, PLASMA    RADIOLOGY  Dg Chest 2 View  Result Date: 09/21/2016 CLINICAL DATA:  Productive cough for 1 week, shortness of breath, wheezing, history CHF, hypertension, former smoker EXAM: CHEST  2 VIEW COMPARISON:  06/14/2016 FINDINGS: Enlargement of cardiac silhouette. Mediastinal contours and pulmonary vascularity normal. Linear  infiltrate with interval loss of the LEFT heart border silhouette consistent with pneumonia. Chronic accentuation of RIGHT basilar markings. Upper lungs clear. No pleural effusion or pneumothorax. Bones unremarkable. IMPRESSION: New lingular infiltrate consistent with pneumonia. Enlargement of cardiac silhouette. Electronically Signed   By: Ulyses SouthwardMark  Boles M.D.   On: 09/21/2016 09:23    ____________________________________________   PROCEDURES  Procedure(s) performed: None  Procedures  Critical Care performed: No  ____________________________________________   INITIAL IMPRESSION / ASSESSMENT AND PLAN / ED COURSE  Pertinent labs & imaging results that were available during my care of the patient were reviewed by me and considered in my medical decision making (see chart for details).  ----------------------------------------- 1:16 PM on 09/21/2016 ----------------------------------------- Spoke with the heart clinic at [facility] and they have made an appointment but the patient and will get in contact with him. They have already given him information for a PCP and he has not followed up with those recommendations. Because patient tends to  be noncompliant he was given IV Levaquin while in the department and was feeling better prior to discharge. Patient was ambulatory without assistance. He was given a prescription for Levaquin to continue taking. Heart center states that he has information about [employer] and programs to assist financially better provided at [facility]. Patient is to return if any worsening of his symptoms.    ____________________________________________   FINAL CLINICAL IMPRESSION(S) / ED DIAGNOSES  Final diagnoses:  Pneumonia of left lung due to infectious organism, unspecified part of lung      NEW MEDICATIONS STARTED DURING THIS VISIT:  Discharge Medication List as of 09/21/2016  1:09 PM    START taking these  medications   Details  levofloxacin (LEVAQUIN) 500 MG tablet Take 1 tablet (500 mg total) by mouth daily., Starting Tue 09/21/2016, Until Fri 10/01/2016, Print         Note:  This document was prepared using Dragon voice recognition software and may include unintentional dictation errors.    Tommi Rumps, PA-C 09/21/16 1700    Jeanmarie Plant, MD 09/22/16 [phone]

## 2016-09-30 ENCOUNTER — Telehealth: Payer: Self-pay | Admitting: Family

## 2016-09-30 ENCOUNTER — Ambulatory Visit: Payer: Self-pay | Admitting: Family

## 2016-09-30 NOTE — Telephone Encounter (Signed)
Patient did not show for his Heart Failure Clinic appointment on 09/30/16. This is the 2nd appointment that he has missed. Will attempt to reschedule.

## 2016-11-08 ENCOUNTER — Encounter: Payer: Self-pay | Admitting: Family

## 2016-11-08 ENCOUNTER — Ambulatory Visit: Payer: Medicaid Other | Attending: Family | Admitting: Family

## 2016-11-08 VITALS — BP 136/85 | HR 85 | Resp 20 | Ht 72.0 in | Wt 275.0 lb

## 2016-11-08 DIAGNOSIS — R079 Chest pain, unspecified: Secondary | ICD-10-CM | POA: Insufficient documentation

## 2016-11-08 DIAGNOSIS — I5033 Acute on chronic diastolic (congestive) heart failure: Secondary | ICD-10-CM

## 2016-11-08 DIAGNOSIS — R42 Dizziness and giddiness: Secondary | ICD-10-CM | POA: Diagnosis not present

## 2016-11-08 DIAGNOSIS — Z79899 Other long term (current) drug therapy: Secondary | ICD-10-CM | POA: Diagnosis not present

## 2016-11-08 DIAGNOSIS — Z9049 Acquired absence of other specified parts of digestive tract: Secondary | ICD-10-CM | POA: Diagnosis not present

## 2016-11-08 DIAGNOSIS — R002 Palpitations: Secondary | ICD-10-CM | POA: Insufficient documentation

## 2016-11-08 DIAGNOSIS — N183 Chronic kidney disease, stage 3 (moderate): Secondary | ICD-10-CM | POA: Insufficient documentation

## 2016-11-08 DIAGNOSIS — I130 Hypertensive heart and chronic kidney disease with heart failure and stage 1 through stage 4 chronic kidney disease, or unspecified chronic kidney disease: Secondary | ICD-10-CM | POA: Diagnosis not present

## 2016-11-08 DIAGNOSIS — Z823 Family history of stroke: Secondary | ICD-10-CM | POA: Insufficient documentation

## 2016-11-08 DIAGNOSIS — Z8249 Family history of ischemic heart disease and other diseases of the circulatory system: Secondary | ICD-10-CM | POA: Insufficient documentation

## 2016-11-08 DIAGNOSIS — Z888 Allergy status to other drugs, medicaments and biological substances status: Secondary | ICD-10-CM | POA: Insufficient documentation

## 2016-11-08 DIAGNOSIS — I5032 Chronic diastolic (congestive) heart failure: Secondary | ICD-10-CM | POA: Insufficient documentation

## 2016-11-08 DIAGNOSIS — M7989 Other specified soft tissue disorders: Secondary | ICD-10-CM | POA: Diagnosis not present

## 2016-11-08 DIAGNOSIS — R0683 Snoring: Secondary | ICD-10-CM | POA: Insufficient documentation

## 2016-11-08 DIAGNOSIS — I10 Essential (primary) hypertension: Secondary | ICD-10-CM

## 2016-11-08 DIAGNOSIS — Z87891 Personal history of nicotine dependence: Secondary | ICD-10-CM | POA: Insufficient documentation

## 2016-11-08 DIAGNOSIS — Z9889 Other specified postprocedural states: Secondary | ICD-10-CM | POA: Insufficient documentation

## 2016-11-08 DIAGNOSIS — R0602 Shortness of breath: Secondary | ICD-10-CM | POA: Diagnosis not present

## 2016-11-08 MED ORDER — METOPROLOL TARTRATE 50 MG PO TABS: 50.0000 mg | ORAL_TABLET | Freq: Two times a day (BID) | ORAL | 5 refills | Status: DC

## 2016-11-08 MED ORDER — FUROSEMIDE 40 MG PO TABS: 40.0000 mg | ORAL_TABLET | Freq: Every day | ORAL | 5 refills | Status: DC

## 2016-11-08 MED ORDER — LISINOPRIL 20 MG PO TABS: 20.0000 mg | ORAL_TABLET | Freq: Every day | ORAL | 5 refills | Status: DC

## 2016-11-08 NOTE — Progress Notes (Signed)
Subjective:    Patient ID: Trevor Nunez, male    DOB: 01-Apr-1966, 51 y.o.   MRN: [id number]  HPI   Trevor Nunez is a 51 y/o male with a history of HTN, CKD (stage III), previous tobacco use and chronic heart failure.  Last echo was done 06/15/16 and showed an EF of 65-70% along with trivial AR and mildly increased right ventricle wall thickness. This is unchanged from previous one back in July 2017.  Was in the ED 09/21/16 due to pneumonia. Treated and released. Admitted 06/14/16 with HTN and HF exacerbation along with chest pain. Initially treated with IV diuretics. Had an elevated troponin thought to be due to demand ischemia. Had a stress test which was read as low risk by cardiology. Discharged home. Admitted 10/17/15 with HTN and HF exacerbation along with chest pain. Elevated troponin thought to be due to demand ischemia. Cardiology consult done. Discharged home.   He presents today for a follow-up visit with a chief complaint of moderate fatigue with little exertion. He describes this as having been present for years with varying levels of severity. He does feel like his fatigue has worsened over the last few months. He has associated shortness of breath, chest pain, palpitations, leg swelling, weight gain and dizziness along with this.   Past Medical History:  Diagnosis Date  . Chronic diastolic CHF (congestive heart failure) (HCC)   . CKD (chronic kidney disease), stage III   . H/O medication noncompliance   . Hypertension    Past Surgical History:  Procedure Laterality Date  . CHOLECYSTECTOMY    . HERNIA REPAIR     Family History  Problem Relation Age of Onset  . Hypertension Mother   . Heart attack Mother   . CVA Mother   . Heart disease Sister    Social History  Substance Use Topics  . Smoking status: Former Smoker    Quit date: 08/17/2015  . Smokeless tobacco: Never Used  . Alcohol use No    Allergies  Allergen Reactions  . Codeine Palpitations   Prior to Admission  medications   Medication Sig Start Date End Date Taking? Authorizing Provider  hydrochlorothiazide (HYDRODIURIL) 50 MG tablet Take 50 mg by mouth daily.   Yes [provider]  lisinopril (PRINIVIL,ZESTRIL) 20 MG tablet Take 1 tablet (20 mg total) by mouth daily. 11/08/16  Yes Hackney, Inetta Fermoina A, FNP  nitroGLYCERIN (NITROSTAT) 0.4 MG SL tablet Place 1 tablet (0.4 mg total) under the tongue every 5 (five) minutes as needed for chest pain. 10/20/15  Yes Enedina FinnerPatel, Sona, MD  furosemide (LASIX) 40 MG tablet Take 1 tablet (40 mg total) by mouth daily. 11/08/16   Delma FreezeHackney, Tina A, FNP  metoprolol tartrate (LOPRESSOR) 50 MG tablet Take 1 tablet (50 mg total) by mouth 2 (two) times daily. 11/08/16   Delma FreezeHackney, Tina A, FNP    Review of Systems  Constitutional: Positive for fatigue. Negative for appetite change.  HENT: Negative for congestion, postnasal drip and sore throat.   Eyes: Negative.   Respiratory: Positive for shortness of breath. Negative for cough and chest tightness.   Cardiovascular: Positive for chest pain, palpitations and leg swelling.  Gastrointestinal: Negative for abdominal distention and abdominal pain.  Endocrine: Negative.   Genitourinary: Negative.   Musculoskeletal: Positive for arthralgias (both knees). Negative for back pain.  Skin: Negative.   Allergic/Immunologic: Negative.   Neurological: Positive for dizziness (sometimes) and headaches. Negative for light-headedness.  Hematological: Negative for adenopathy. Does not bruise/bleed easily.  Psychiatric/Behavioral: Positive for sleep disturbance (not sleeping well due to breathing; sleeping in the recliner). Negative for dysphoric mood and suicidal ideas. The patient is not nervous/anxious.    Vitals:   11/08/16 1213  BP: 136/85  Pulse: 85  Resp: 20  SpO2: 99%  Weight: 275 lb (124.7 kg)  Height: 6' (1.829 m)   Wt Readings from Last 3 Encounters:  11/08/16 275 lb (124.7 kg)  09/21/16 258 lb (117 kg)  06/25/16 258 lb 8 oz  (117.3 kg)    Lab Results  Component Value Date   CREATININE 2.02 (H) 09/21/2016   CREATININE 1.71 (H) 06/17/2016   CREATININE 1.85 (H) 06/16/2016      Objective:   Physical Exam  Constitutional: He is oriented to person, place, and time. He appears well-developed and well-nourished.  HENT:  Head: Normocephalic and atraumatic.  Neck: Normal range of motion. Neck supple. No JVD present.  Cardiovascular: Normal rate and regular rhythm.   Pulmonary/Chest: Effort normal. He has no wheezes. He has no rales.  Abdominal: Soft. He exhibits no distension. There is no tenderness.  Musculoskeletal: He exhibits edema (3+ pitting edema in bilateral lower legs).  Neurological: He is alert and oriented to person, place, and time.  Skin: Skin is warm and dry.  Psychiatric: He has a normal mood and affect. His behavior is normal. Thought content normal.  Nursing note and vitals reviewed.     Assessment & Plan:   1: Chronic heart failure with preserved ejection fraction- - NYHA class III - euvolemic - weighing daily and he was reminded to call for an overnight weight gain of >2 pounds or a weekly weight gain of >5 pounds - weight up 16.2 pounds since he was last here March 2018 - ReDS vest reading elevated at 44% - not adding salt; encouraged to read food labels.  - instructed him to elevate his legs as much as possible; no longer working and is trying to get disability/medicaid - for the next 3 days, he's to double his furosemide to 80mg  daily. After 3 days, decrease it back down to 40mg  daily.  - BMP at next visit - emphasized the importance of not running out of his medications but he says it's difficult because of his finances. Brochure given to him on [facility] and phone number to Patient Financial Services given to patient as well  2: HTN- - bp slightly elevated - patient says that he's been out of everything except lisinopril and HCTZ - refills provided today on  medications  3: Snoring- - endorses snoring and waking up tired - needs sleep study done  - discussed contacting patient financial services to see if he qualifies for assistance so that a sleep study can be obtained - he says that he's been told in the past that he needs a sleep study done  Medication list was reviewed.  Return here in 3 days or sooner for any questions/problems before then.

## 2016-11-08 NOTE — Patient Instructions (Addendum)
Continue weighing daily and call for an overnight weight gain of > 2 pounds or a weekly weight gain of >5 pounds.  For the next 3 days, take 2 of your furosemide tablets daily (80mg  total). After 3 days, decrease it back down to 40mg  daily.   Will check lab work at your next visit.   Call patient financial services at [phone] to fill out paperwork regarding bill assistance

## 2016-11-11 ENCOUNTER — Telehealth: Payer: Self-pay | Admitting: Family

## 2016-11-11 ENCOUNTER — Encounter: Payer: Self-pay | Admitting: Family

## 2016-11-11 ENCOUNTER — Ambulatory Visit: Payer: Medicaid Other | Attending: Family | Admitting: Family

## 2016-11-11 VITALS — BP 190/110 | HR 69 | Resp 18 | Ht 72.0 in | Wt 275.5 lb

## 2016-11-11 DIAGNOSIS — N183 Chronic kidney disease, stage 3 (moderate): Secondary | ICD-10-CM | POA: Insufficient documentation

## 2016-11-11 DIAGNOSIS — Z885 Allergy status to narcotic agent status: Secondary | ICD-10-CM | POA: Diagnosis not present

## 2016-11-11 DIAGNOSIS — R002 Palpitations: Secondary | ICD-10-CM | POA: Insufficient documentation

## 2016-11-11 DIAGNOSIS — I5032 Chronic diastolic (congestive) heart failure: Secondary | ICD-10-CM | POA: Diagnosis present

## 2016-11-11 DIAGNOSIS — Z823 Family history of stroke: Secondary | ICD-10-CM | POA: Diagnosis not present

## 2016-11-11 DIAGNOSIS — Z87891 Personal history of nicotine dependence: Secondary | ICD-10-CM | POA: Diagnosis not present

## 2016-11-11 DIAGNOSIS — Z8249 Family history of ischemic heart disease and other diseases of the circulatory system: Secondary | ICD-10-CM | POA: Diagnosis not present

## 2016-11-11 DIAGNOSIS — I5033 Acute on chronic diastolic (congestive) heart failure: Secondary | ICD-10-CM | POA: Diagnosis not present

## 2016-11-11 DIAGNOSIS — R42 Dizziness and giddiness: Secondary | ICD-10-CM | POA: Insufficient documentation

## 2016-11-11 DIAGNOSIS — R079 Chest pain, unspecified: Secondary | ICD-10-CM | POA: Diagnosis not present

## 2016-11-11 DIAGNOSIS — R0683 Snoring: Secondary | ICD-10-CM | POA: Diagnosis not present

## 2016-11-11 DIAGNOSIS — I10 Essential (primary) hypertension: Secondary | ICD-10-CM

## 2016-11-11 DIAGNOSIS — Z79899 Other long term (current) drug therapy: Secondary | ICD-10-CM | POA: Diagnosis not present

## 2016-11-11 DIAGNOSIS — I130 Hypertensive heart and chronic kidney disease with heart failure and stage 1 through stage 4 chronic kidney disease, or unspecified chronic kidney disease: Secondary | ICD-10-CM | POA: Insufficient documentation

## 2016-11-11 LAB — BASIC METABOLIC PANEL
ANION GAP: 7 (ref 5–15)
BUN: 22 mg/dL — ABNORMAL HIGH (ref 6–20)
CHLORIDE: 108 mmol/L (ref 101–111)
CO2: 29 mmol/L (ref 22–32)
Calcium: 9.2 mg/dL (ref 8.9–10.3)
Creatinine, Ser: 1.87 mg/dL — ABNORMAL HIGH (ref 0.61–1.24)
GFR calc Af Amer: 46 mL/min — ABNORMAL LOW (ref >60)
GFR, EST NON AFRICAN AMERICAN: 40 mL/min — AB (ref >60)
Glucose, Bld: 92 mg/dL (ref 65–99)
POTASSIUM: 3.8 mmol/L (ref 3.5–5.1)
SODIUM: 144 mmol/L (ref 135–145)

## 2016-11-11 MED ORDER — POTASSIUM CHLORIDE CRYS ER 20 MEQ PO TBCR
40.0000 meq | EXTENDED_RELEASE_TABLET | Freq: Every day | ORAL | 0 refills | Status: DC
Start: 2016-11-11 — End: 2016-11-17

## 2016-11-11 MED ORDER — METOLAZONE 5 MG PO TABS: 5.0000 mg | ORAL_TABLET | Freq: Every day | ORAL | 0 refills | Status: DC

## 2016-11-11 NOTE — Patient Instructions (Signed)
Take metolazone 1/2 hour prior to lasix (furosemide) if possible  Continue weighing daily and call for an overnight weight gain of > 2 pounds or a weekly weight gain of >5 pounds.

## 2016-11-11 NOTE — Progress Notes (Signed)
Subjective:    Patient ID: Trevor Nunez, male    DOB: 1966/01/03, 51 y.o.   MRN: [id number]  HPI   Trevor Nunez is a 51 y/o male with a history of HTN, CKD (stage III), previous tobacco use and chronic heart failure.  Last echo was done 06/15/16 and showed an EF of 65-70% along with trivial AR and mildly increased right ventricle wall thickness. This is unchanged from previous one back in July 2017.  Was in the ED 09/21/16 due to pneumonia. Treated and released. Admitted 06/14/16 with HTN and HF exacerbation along with chest pain. Initially treated with IV diuretics. Had an elevated troponin thought to be due to demand ischemia. Had a stress test which was read as low risk by cardiology. Discharged home. Admitted 10/17/15 with HTN and HF exacerbation along with chest pain. Elevated troponin thought to be due to demand ischemia. Cardiology consult done. Discharged home.   He presents today for a follow-up visit with a chief complaint of continued moderate fatigue with little exertion. He describes this as having been present for years with varying levels of severity. He does feel like his fatigue has worsened over the last few months. He has associated shortness of breath, chest pain, palpitations, leg swelling, weight gain and dizziness along with this.   Past Medical History:  Diagnosis Date  . Chronic diastolic CHF (congestive heart failure) (HCC)   . CKD (chronic kidney disease), stage III   . H/O medication noncompliance   . Hypertension    Past Surgical History:  Procedure Laterality Date  . CHOLECYSTECTOMY    . HERNIA REPAIR     Family History  Problem Relation Age of Onset  . Hypertension Mother   . Heart attack Mother   . CVA Mother   . Heart disease Sister    Social History  Substance Use Topics  . Smoking status: Former Smoker    Quit date: 08/17/2015  . Smokeless tobacco: Never Used  . Alcohol use No    Allergies  Allergen Reactions  . Codeine Palpitations   Prior to  Admission medications   Medication Sig Start Date End Date Taking? Authorizing Provider  furosemide (LASIX) 40 MG tablet Take 1 tablet (40 mg total) by mouth daily. 11/08/16  Yes Sagan Wurzel A, FNP  hydrochlorothiazide (HYDRODIURIL) 50 MG tablet Take 50 mg by mouth daily.   Yes [provider]  lisinopril (PRINIVIL,ZESTRIL) 20 MG tablet Take 1 tablet (20 mg total) by mouth daily. 11/08/16  Yes Clarisa KindredHackney, Dearia Wilmouth A, FNP  metoprolol tartrate (LOPRESSOR) 50 MG tablet Take 1 tablet (50 mg total) by mouth 2 (two) times daily. 11/08/16  Yes Ameria Sanjurjo, Inetta Fermoina A, FNP  nitroGLYCERIN (NITROSTAT) 0.4 MG SL tablet Place 1 tablet (0.4 mg total) under the tongue every 5 (five) minutes as needed for chest pain. 10/20/15  Yes Enedina FinnerPatel, Sona, MD    Review of Systems  Constitutional: Positive for fatigue. Negative for appetite change.  HENT: Negative for congestion, postnasal drip and sore throat.   Eyes: Negative.   Respiratory: Positive for shortness of breath. Negative for cough and chest tightness.   Cardiovascular: Positive for chest pain (last night/relieved with NTG), palpitations and leg swelling.  Gastrointestinal: Negative for abdominal distention and abdominal pain.  Endocrine: Negative.   Genitourinary: Negative.   Musculoskeletal: Positive for arthralgias (both knees). Negative for back pain.  Skin: Negative.   Allergic/Immunologic: Negative.   Neurological: Positive for dizziness (sometimes) and headaches. Negative for light-headedness.  Hematological: Negative for  adenopathy. Does not bruise/bleed easily.  Psychiatric/Behavioral: Positive for sleep disturbance (not sleeping well due to breathing; sleeping in the recliner). Negative for dysphoric mood and suicidal ideas. The patient is not nervous/anxious.    Vitals:   11/11/16 0833  BP: (!) 205/122  Pulse: 69  Resp: 18  SpO2: 98%  Weight: 275 lb 8 oz (125 kg)  Height: 6' (1.829 m)   Wt Readings from Last 3 Encounters:  11/11/16 275 lb 8 oz  (125 kg)  11/08/16 275 lb (124.7 kg)  09/21/16 258 lb (117 kg)    Lab Results  Component Value Date   CREATININE 2.02 (H) 09/21/2016   CREATININE 1.71 (H) 06/17/2016   CREATININE 1.85 (H) 06/16/2016      Objective:   Physical Exam  Constitutional: He is oriented to person, place, and time. He appears well-developed and well-nourished.  HENT:  Head: Normocephalic and atraumatic.  Neck: Normal range of motion. Neck supple. No JVD present.  Cardiovascular: Normal rate and regular rhythm.   Pulmonary/Chest: Effort normal. He has no wheezes. He has no rales.  Abdominal: Soft. He exhibits no distension. There is no tenderness.  Musculoskeletal: He exhibits edema (3+ pitting edema in bilateral lower legs).  Neurological: He is alert and oriented to person, place, and time.  Skin: Skin is warm and dry.  Psychiatric: He has a normal mood and affect. His behavior is normal. Thought content normal.  Nursing note and vitals reviewed.     Assessment & Plan:   1: Acute on chronic heart failure with preserved ejection fraction- - NYHA class III - moderately fluid overloaded - weighing daily and he was reminded to call for an overnight weight gain of >2 pounds or a weekly weight gain of >5 pounds - weight unchanged even after doubling furosemide for 3 days - ReDS vest reading elevated at 43% (was 44% last week) - not adding salt; encouraged to read food labels.  - instructed him to elevate his legs as much as possible; no longer working and is trying to get disability/medicaid - add 5mg  metolazone daily for the next 3 days; best to take it 1/2 hour prior to furosemide if able - may need to add potassium but will get labs back first - BMP today - waiting on paperwork from [facility] and charity care program  2: HTN- - bp elevated but was coming down on recheck (190/110) - had been taking diet supplement called meratrim but has recently stopped taking it  3: Snoring- - endorses  snoring and waking up tired - needs sleep study done  - waiting on paperwork about financial assistance - he says that he's been told in the past that he needs a sleep study done  Medication list was reviewed.  Return here in 5 days or sooner for any questions/problems before then. Will recheck lab work at that time.

## 2016-11-11 NOTE — Telephone Encounter (Signed)
Spoke with patient regarding lab work that was drawn today (11/11/16). GFR 46 with a potassium of 3.8. Adding 5mg  metolazone today for the next 3 days so will add 40meq potassium daily as well.   Will recheck labs next week at his follow-up visit.

## 2016-11-16 ENCOUNTER — Ambulatory Visit: Payer: Medicaid Other | Admitting: Family

## 2016-11-16 ENCOUNTER — Emergency Department: Payer: Medicaid Other

## 2016-11-16 ENCOUNTER — Encounter: Payer: Self-pay | Admitting: Family

## 2016-11-16 ENCOUNTER — Inpatient Hospital Stay
Admission: EM | Admit: 2016-11-16 | Discharge: 2016-11-18 | DRG: 291 | Disposition: A | Discharge status: Home / Self Care | Payer: Medicaid Other | Attending: Internal Medicine | Admitting: Internal Medicine

## 2016-11-16 ENCOUNTER — Encounter: Payer: Self-pay | Admitting: Emergency Medicine

## 2016-11-16 VITALS — BP 188/141 | HR 81 | Resp 20 | Ht 72.0 in | Wt 274.5 lb

## 2016-11-16 DIAGNOSIS — I130 Hypertensive heart and chronic kidney disease with heart failure and stage 1 through stage 4 chronic kidney disease, or unspecified chronic kidney disease: Secondary | ICD-10-CM | POA: Diagnosis present

## 2016-11-16 DIAGNOSIS — I10 Essential (primary) hypertension: Secondary | ICD-10-CM | POA: Diagnosis present

## 2016-11-16 DIAGNOSIS — G4730 Sleep apnea, unspecified: Secondary | ICD-10-CM | POA: Diagnosis present

## 2016-11-16 DIAGNOSIS — E669 Obesity, unspecified: Secondary | ICD-10-CM | POA: Diagnosis present

## 2016-11-16 DIAGNOSIS — R51 Headache: Secondary | ICD-10-CM | POA: Insufficient documentation

## 2016-11-16 DIAGNOSIS — R7989 Other specified abnormal findings of blood chemistry: Secondary | ICD-10-CM

## 2016-11-16 DIAGNOSIS — I7100 Dissection of unspecified site of aorta: Secondary | ICD-10-CM

## 2016-11-16 DIAGNOSIS — Z6835 Body mass index (BMI) 35.0-35.9, adult: Secondary | ICD-10-CM | POA: Diagnosis not present

## 2016-11-16 DIAGNOSIS — Z885 Allergy status to narcotic agent status: Secondary | ICD-10-CM

## 2016-11-16 DIAGNOSIS — I5033 Acute on chronic diastolic (congestive) heart failure: Secondary | ICD-10-CM | POA: Insufficient documentation

## 2016-11-16 DIAGNOSIS — Z9111 Patient's noncompliance with dietary regimen: Secondary | ICD-10-CM

## 2016-11-16 DIAGNOSIS — I248 Other forms of acute ischemic heart disease: Secondary | ICD-10-CM | POA: Diagnosis present

## 2016-11-16 DIAGNOSIS — Z79899 Other long term (current) drug therapy: Secondary | ICD-10-CM

## 2016-11-16 DIAGNOSIS — Z9119 Patient's noncompliance with other medical treatment and regimen: Secondary | ICD-10-CM | POA: Diagnosis not present

## 2016-11-16 DIAGNOSIS — I872 Venous insufficiency (chronic) (peripheral): Secondary | ICD-10-CM | POA: Diagnosis present

## 2016-11-16 DIAGNOSIS — M940 Chondrocostal junction syndrome [Tietze]: Secondary | ICD-10-CM | POA: Diagnosis present

## 2016-11-16 DIAGNOSIS — R079 Chest pain, unspecified: Secondary | ICD-10-CM

## 2016-11-16 DIAGNOSIS — N183 Chronic kidney disease, stage 3 unspecified: Secondary | ICD-10-CM | POA: Diagnosis present

## 2016-11-16 DIAGNOSIS — Z8249 Family history of ischemic heart disease and other diseases of the circulatory system: Secondary | ICD-10-CM | POA: Insufficient documentation

## 2016-11-16 DIAGNOSIS — Z87891 Personal history of nicotine dependence: Secondary | ICD-10-CM | POA: Diagnosis not present

## 2016-11-16 DIAGNOSIS — Z823 Family history of stroke: Secondary | ICD-10-CM | POA: Insufficient documentation

## 2016-11-16 DIAGNOSIS — R748 Abnormal levels of other serum enzymes: Secondary | ICD-10-CM | POA: Diagnosis present

## 2016-11-16 DIAGNOSIS — R778 Other specified abnormalities of plasma proteins: Secondary | ICD-10-CM

## 2016-11-16 DIAGNOSIS — Z9114 Patient's other noncompliance with medication regimen: Secondary | ICD-10-CM | POA: Diagnosis not present

## 2016-11-16 LAB — CBC
HCT: 46.2 % (ref 40.0–52.0)
HEMATOCRIT: 45.4 % (ref 40.0–52.0)
HEMOGLOBIN: 15.5 g/dL (ref 13.0–18.0)
HEMOGLOBIN: 15.4 g/dL (ref 13.0–18.0)
MCH: 29.2 pg (ref 26.0–34.0)
MCH: 29.2 pg (ref 26.0–34.0)
MCHC: 33.6 g/dL (ref 32.0–36.0)
MCHC: 33.8 g/dL (ref 32.0–36.0)
MCV: 86.9 fL (ref 80.0–100.0)
MCV: 86.5 fL (ref 80.0–100.0)
Platelets: 234 10*3/uL (ref 150–440)
Platelets: 240 10*3/uL (ref 150–440)
RBC: 5.32 MIL/uL (ref 4.40–5.90)
RBC: 5.26 MIL/uL (ref 4.40–5.90)
RDW: 13.4 % (ref 11.5–14.5)
RDW: 13.5 % (ref 11.5–14.5)
WBC: 6.8 10*3/uL (ref 3.8–10.6)
WBC: 6.9 10*3/uL (ref 3.8–10.6)

## 2016-11-16 LAB — HEPATIC FUNCTION PANEL
ALT: 17 U/L (ref 17–63)
AST: 20 U/L (ref 15–41)
Albumin: 4 g/dL (ref 3.5–5.0)
Alkaline Phosphatase: 80 U/L (ref 38–126)
BILIRUBIN DIRECT: 0.1 mg/dL (ref 0.1–0.5)
Indirect Bilirubin: 0.9 mg/dL (ref 0.3–0.9)
Total Bilirubin: 1 mg/dL (ref 0.3–1.2)
Total Protein: 7.7 g/dL (ref 6.5–8.1)

## 2016-11-16 LAB — TROPONIN I
TROPONIN I: 0.08 ng/mL — AB (ref <0.03)
TROPONIN I: 0.09 ng/mL — AB (ref <0.03)
Troponin I: 0.09 ng/mL (ref <0.03)

## 2016-11-16 LAB — CREATININE, SERUM
Creatinine, Ser: 1.79 mg/dL — ABNORMAL HIGH (ref 0.61–1.24)
GFR, EST AFRICAN AMERICAN: 49 mL/min — AB (ref >60)
GFR, EST NON AFRICAN AMERICAN: 42 mL/min — AB (ref >60)

## 2016-11-16 LAB — BASIC METABOLIC PANEL
ANION GAP: 10 (ref 5–15)
BUN: 34 mg/dL — ABNORMAL HIGH (ref 6–20)
CALCIUM: 9 mg/dL (ref 8.9–10.3)
CHLORIDE: 108 mmol/L (ref 101–111)
CO2: 24 mmol/L (ref 22–32)
Creatinine, Ser: 1.8 mg/dL — ABNORMAL HIGH (ref 0.61–1.24)
GFR calc non Af Amer: 42 mL/min — ABNORMAL LOW (ref >60)
GFR, EST AFRICAN AMERICAN: 48 mL/min — AB (ref >60)
Glucose, Bld: 104 mg/dL — ABNORMAL HIGH (ref 65–99)
Potassium: 3.7 mmol/L (ref 3.5–5.1)
Sodium: 142 mmol/L (ref 135–145)

## 2016-11-16 LAB — LIPID PANEL
Cholesterol: 204 mg/dL — ABNORMAL HIGH (ref 0–200)
HDL: 42 mg/dL (ref >40)
LDL CALC: 143 mg/dL — AB (ref 0–99)
Total CHOL/HDL Ratio: 4.9 RATIO
Triglycerides: 96 mg/dL (ref <150)
VLDL: 19 mg/dL (ref 0–40)

## 2016-11-16 LAB — POTASSIUM: Potassium: 3.6 mmol/L (ref 3.5–5.1)

## 2016-11-16 LAB — BRAIN NATRIURETIC PEPTIDE: B Natriuretic Peptide: 214 pg/mL — ABNORMAL HIGH (ref 0.0–100.0)

## 2016-11-16 LAB — MAGNESIUM: Magnesium: 2 mg/dL (ref 1.7–2.4)

## 2016-11-16 MED ORDER — HYDRALAZINE HCL 20 MG/ML IJ SOLN
10.0000 mg | Freq: Four times a day (QID) | INTRAMUSCULAR | Status: DC | PRN
  Administered 2016-11-16: 10 mg via INTRAVENOUS
  Filled 2016-11-16: qty 1

## 2016-11-16 MED ORDER — LISINOPRIL 20 MG PO TABS: 40.0000 mg | ORAL_TABLET | Freq: Every day | ORAL | Status: DC

## 2016-11-16 MED ORDER — LABETALOL HCL 5 MG/ML IV SOLN
10.0000 mg | INTRAVENOUS | Status: DC | PRN
  Administered 2016-11-16: 10 mg via INTRAVENOUS
  Filled 2016-11-16: qty 4

## 2016-11-16 MED ORDER — HYDROCHLOROTHIAZIDE 25 MG PO TABS
50.0000 mg | ORAL_TABLET | Freq: Every day | ORAL | Status: DC
  Administered 2016-11-16: 50 mg via ORAL
  Filled 2016-11-16: qty 2

## 2016-11-16 MED ORDER — DOCUSATE SODIUM 100 MG PO CAPS
100.0000 mg | ORAL_CAPSULE | Freq: Two times a day (BID) | ORAL | Status: DC
  Administered 2016-11-16 – 2016-11-18 (×4): 100 mg via ORAL
  Filled 2016-11-16 (×5): qty 1

## 2016-11-16 MED ORDER — ONDANSETRON HCL 4 MG PO TABS: 4.0000 mg | ORAL_TABLET | Freq: Four times a day (QID) | ORAL | Status: DC | PRN

## 2016-11-16 MED ORDER — NITROGLYCERIN 0.4 MG SL SUBL
0.4000 mg | SUBLINGUAL_TABLET | SUBLINGUAL | Status: DC | PRN
Start: 2016-11-16 — End: 2016-11-16

## 2016-11-16 MED ORDER — SODIUM CHLORIDE 0.9% FLUSH: 3.0000 mL | INTRAVENOUS | Status: DC | PRN

## 2016-11-16 MED ORDER — SENNOSIDES-DOCUSATE SODIUM 8.6-50 MG PO TABS: 1.0000 | ORAL_TABLET | Freq: Every evening | ORAL | Status: DC | PRN

## 2016-11-16 MED ORDER — ACETAMINOPHEN 325 MG PO TABS
650.0000 mg | ORAL_TABLET | Freq: Four times a day (QID) | ORAL | Status: DC | PRN
  Administered 2016-11-16: 650 mg via ORAL
  Filled 2016-11-16: qty 2

## 2016-11-16 MED ORDER — SODIUM CHLORIDE 0.9% FLUSH
3.0000 mL | Freq: Two times a day (BID) | INTRAVENOUS | Status: DC
  Administered 2016-11-16 – 2016-11-18 (×4): 3 mL via INTRAVENOUS

## 2016-11-16 MED ORDER — HYDROCODONE-ACETAMINOPHEN 5-325 MG PO TABS
1.0000 | ORAL_TABLET | ORAL | Status: DC | PRN
  Administered 2016-11-16 – 2016-11-18 (×5): 2 via ORAL
  Filled 2016-11-16 (×5): qty 2

## 2016-11-16 MED ORDER — ACETAMINOPHEN 650 MG RE SUPP: 650.0000 mg | Freq: Four times a day (QID) | RECTAL | Status: DC | PRN

## 2016-11-16 MED ORDER — LISINOPRIL 20 MG PO TABS
20.0000 mg | ORAL_TABLET | Freq: Every day | ORAL | Status: DC
  Administered 2016-11-16 – 2016-11-18 (×3): 20 mg via ORAL
  Filled 2016-11-16 (×3): qty 1

## 2016-11-16 MED ORDER — ONDANSETRON HCL 4 MG/2ML IJ SOLN: 4.0000 mg | Freq: Four times a day (QID) | INTRAMUSCULAR | Status: DC | PRN

## 2016-11-16 MED ORDER — ASPIRIN 81 MG PO CHEW
81.0000 mg | CHEWABLE_TABLET | Freq: Every day | ORAL | Status: DC
  Administered 2016-11-16 – 2016-11-18 (×4): 81 mg via ORAL
  Filled 2016-11-16 (×3): qty 1

## 2016-11-16 MED ORDER — METOPROLOL TARTRATE 50 MG PO TABS
50.0000 mg | ORAL_TABLET | Freq: Two times a day (BID) | ORAL | Status: DC
  Administered 2016-11-16 – 2016-11-18 (×4): 50 mg via ORAL
  Filled 2016-11-16 (×4): qty 1

## 2016-11-16 MED ORDER — METOPROLOL TARTRATE 50 MG PO TABS: 50.0000 mg | ORAL_TABLET | Freq: Two times a day (BID) | ORAL | Status: DC

## 2016-11-16 MED ORDER — FUROSEMIDE 10 MG/ML IJ SOLN
40.0000 mg | Freq: Two times a day (BID) | INTRAMUSCULAR | Status: DC
  Administered 2016-11-16 – 2016-11-17 (×2): 40 mg via INTRAVENOUS
  Filled 2016-11-16 (×2): qty 4

## 2016-11-16 MED ORDER — SODIUM CHLORIDE 0.9 % IV SOLN: 250.0000 mL | INTRAVENOUS | Status: DC | PRN

## 2016-11-16 MED ORDER — ENOXAPARIN SODIUM 40 MG/0.4ML ~~LOC~~ SOLN
40.0000 mg | SUBCUTANEOUS | Status: DC
  Administered 2016-11-16 – 2016-11-17 (×2): 40 mg via SUBCUTANEOUS
  Filled 2016-11-16 (×2): qty 0.4

## 2016-11-16 MED ORDER — ASPIRIN 81 MG PO CHEW
CHEWABLE_TABLET | ORAL | Status: AC
  Administered 2016-11-16: 81 mg via ORAL
  Filled 2016-11-16: qty 1

## 2016-11-16 MED ORDER — NITROGLYCERIN IN D5W 200-5 MCG/ML-% IV SOLN
0.0000 ug/min | Freq: Once | INTRAVENOUS | Status: AC
  Administered 2016-11-16: 50 ug/min via INTRAVENOUS
  Filled 2016-11-16: qty 250

## 2016-11-16 NOTE — ED Triage Notes (Signed)
Pt sent over from PCP for further eval of high blood pressure and chest pain.

## 2016-11-16 NOTE — ED Notes (Signed)
Dr. Lamont Snowballifenbark informed of Troponin of 0.08.

## 2016-11-16 NOTE — H&P (Signed)
[facility] [place] [facility]   PATIENT NAME: Trevor Nunez    MR#:  [id number]  DATE OF BIRTH:  07/02/1965  DATE OF ADMISSION:  11/16/2016  PRIMARY CARE PHYSICIAN: Marvis Moellerkimberly Lykins   REQUESTING/REFERRING PHYSICIAN: dr Lamont Snowballrifenbark  CHIEF COMPLAINT:   Sent from CHF clinic with CP, elevated BP and SOB HISTORY OF PRESENT ILLNESS:  Trevor Senegalroy Coyt  is a 51 y.o. male with a known history of Chronic kidney disease stage III, chronic diastolic heart failure with preserved ejection fraction, medication noncompliance and essential hypertension who presents to the emergency room with above complaint. Patient was seen at CHF clinic today for follow-up for CHF as well as blood pressure. Blood pressure was 188/141 and patient was also complaining of headache and chest pain so he was sent to the ER for further evaluation.  Patient reports that his chest pain is sharp and stabbing in nature located on the left side and radiates to his scapula. He reports is currently a 7 out of 10. He reports no exacerbating or relieving factors. He reports that he has had on and off chest pain over the several past weeks. Patient also reports that his blood pressures are historically elevated sometimes in the 180s to 200s. Patient has headache but no nuchal rigidity.  In the emergency room he is started on nitroglycerin drip. He is also restarted on his home medications which I have ordered. PAST MEDICAL HISTORY:   Past Medical History:  Diagnosis Date  . Chronic diastolic CHF (congestive heart failure) (HCC)   . CKD (chronic kidney disease), stage III   . H/O medication noncompliance   . Hypertension     PAST SURGICAL HISTORY:   Past Surgical History:  Procedure Laterality Date  . CHOLECYSTECTOMY    . HERNIA REPAIR      SOCIAL HISTORY:   Social History  Substance Use Topics  . Smoking status: Former Smoker    Quit date: 08/17/2015  . Smokeless tobacco: Never Used  . Alcohol use No     FAMILY HISTORY:   Family History  Problem Relation Age of Onset  . Hypertension Mother   . Heart attack Mother   . CVA Mother   . Heart disease Sister     DRUG ALLERGIES:   Allergies  Allergen Reactions  . Codeine Palpitations    REVIEW OF SYSTEMS:   Review of Systems  Constitutional: Positive for diaphoresis. Negative for chills, fever and malaise/fatigue.  HENT: Negative for ear discharge, ear pain, hearing loss, nosebleeds and sore throat.   Eyes: Negative.  Negative for blurred vision and pain.  Respiratory: Positive for shortness of breath. Negative for cough, hemoptysis and wheezing.   Cardiovascular: Positive for chest pain, orthopnea, leg swelling and PND. Negative for palpitations.  Gastrointestinal: Negative.  Negative for abdominal pain, blood in stool, diarrhea, nausea and vomiting.  Genitourinary: Negative.  Negative for dysuria.  Musculoskeletal: Negative.  Negative for back pain.  Skin: Negative.   Neurological: Positive for weakness and headaches. Negative for dizziness, tremors, speech change, focal weakness and seizures.  Endo/Heme/Allergies: Negative.  Does not bruise/bleed easily.  Psychiatric/Behavioral: Negative.  Negative for depression, hallucinations and suicidal ideas.    MEDICATIONS AT HOME:   Prior to Admission medications   Medication Sig Start Date End Date Taking? Authorizing Provider  furosemide (LASIX) 40 MG tablet Take 1 tablet (40 mg total) by mouth daily. 11/08/16   Delma FreezeHackney, Tina A, FNP  hydrochlorothiazide (HYDRODIURIL) 50 MG tablet Take 50 mg by  mouth daily.    [provider]  lisinopril (PRINIVIL,ZESTRIL) 20 MG tablet Take 1 tablet (20 mg total) by mouth daily. 11/08/16   Delma FreezeHackney, Tina A, FNP  metoprolol tartrate (LOPRESSOR) 50 MG tablet Take 1 tablet (50 mg total) by mouth 2 (two) times daily. 11/08/16   Delma FreezeHackney, Tina A, FNP  nitroGLYCERIN (NITROSTAT) 0.4 MG SL tablet Place 1 tablet (0.4 mg total) under the tongue every 5  (five) minutes as needed for chest pain. 10/20/15   Enedina FinnerPatel, Sona, MD  potassium chloride SA (K-DUR,KLOR-CON) 20 MEQ tablet Take 2 tablets (40 mEq total) by mouth daily. While taking metolazone 11/11/16 02/09/17  Clarisa KindredHackney, Tina A, FNP      VITAL SIGNS:  Blood pressure (!) 197/148, pulse 76, temperature 98.4 F (36.9 C), temperature source Oral, resp. rate 14, weight 124.3 kg (274 lb), SpO2 100 %.  PHYSICAL EXAMINATION:   Physical Exam  Constitutional: He is oriented to person, place, and time and well-developed, well-nourished, and in no distress. No distress.  HENT:  Head: Normocephalic.  Eyes: No scleral icterus.  Neck: Normal range of motion. Neck supple. No JVD present. No tracheal deviation present.  Cardiovascular: Normal rate, regular rhythm and normal heart sounds.  Exam reveals no gallop and no friction rub.   No murmur heard. Pulmonary/Chest: Effort normal and breath sounds normal. No respiratory distress. He has no wheezes. He has no rales. He exhibits no tenderness.  Abdominal: Soft. Bowel sounds are normal. He exhibits distension. He exhibits no mass. There is no tenderness. There is no rebound and no guarding.  Musculoskeletal: Normal range of motion. He exhibits edema.  Neurological: He is alert and oriented to person, place, and time.  Skin: Skin is warm. No rash noted. No erythema.  Psychiatric: Affect and judgment normal.      LABORATAORY PANEL:   CBC  Recent Labs Lab 11/16/16 1040  WBC 6.8  HGB 15.5  HCT 46.2  PLT 234   ------------------------------------------------------------------------------------------------------------------  Chemistries   Recent Labs Lab 11/16/16 1040  NA 142  K 3.7  CL 108  CO2 24  GLUCOSE 104*  BUN 34*  CREATININE 1.80*  CALCIUM 9.0   ------------------------------------------------------------------------------------------------------------------  Cardiac Enzymes  Recent Labs Lab 11/16/16 1040  TROPONINI 0.08*    ------------------------------------------------------------------------------------------------------------------  RADIOLOGY:  Dg Chest 2 View  Result Date: 11/16/2016 CLINICAL DATA:  Hypertension and chest pain EXAM: CHEST  2 VIEW COMPARISON:  09/21/2016 FINDINGS: Cardiomegaly. Aortic tortuosity. 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 No effusions. No focal lesion. No significant bone finding. IMPRESSION: Cardiomegaly. Aortic atherosclerosis. 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 Electronically Signed   By: Paulina FusiMark  Shogry M.D.   On: 11/16/2016 11:05    EKG:   Sinus rhythm with PVCs and T-wave abnormalities in the lateral leads  IMPRESSION AND PLAN:   51 year old male with known medication noncompliance, chronic diastolic heart failure and hypertension who presents with shortness of breath, chest pain and elevated blood pressure.  1. Malignant until hypertension: Patient currently on nitroglycerin gtt Restart metoprolol, lisinopril and HCTZ Monitor BP I don't necessarily think we need to decrease blood pressure within normal limits as patient has chronic elevated blood pressure. Goal systolic blood pressure would be less than 160  2.  Chest pain with slightly elevated troponin concerning for ACS  versus demand ischemia [facility] cardiology consult Continue telemetry Continue to trend troponins Start aspirin check LIPID panel  3. Acute on chronic diastolic CHF with pEF from elevated BP Start IV LASIX 40mg  BID Monitor I/o and  daily weight Continue Metoprolol and ACEI  4.CKD stage 3: creatinine stable   All the records are reviewed and case discussed with ED provider. Management plans discussed with the patient and he in agreement  CODE STATUS: full  Critical care TOTAL TIME TAKING CARE OF THIS PATIENT: 55 minutes.    Cecila Satcher M.D on 11/16/2016 at 11:38 AM  Between 7am to 6pm - Pager - [phone]  After 6pm go to [email or web address] - password [email or web address]  [facility]  Office  [phone]  CC: Primary care physician; Patient, No Pcp Per

## 2016-11-16 NOTE — Progress Notes (Addendum)
Spoke w/ Dr. Juliene PinaMody regarding patient's continuous Nitroglycerin drip that is now at the max floor rate of 18 cc / hour. She states that he is to be kept at this rate for as long as his B.P. tolerates. If he should begin not tolerating this dose (systolic B.P. < 100), weaning parameters to be followed. Will now monitor B.P. Q 30 minutes for the remainder of day shift. Jari FavreSteven M [facility]   Spoke w/ Dr. Juliene PinaMody again regarding patient's B.P. now WNL, wants now to start titrating the drip rate down to maintain systolic B.P. between 150 - 170 (as much as possible). Will begin titrating flow rate now. Also wants a magnesium and a potassium level drawn at this time, and to report values not WNL. Will enter in those orders and continue to closely monitor. Jari FavreSteven M [facility]

## 2016-11-16 NOTE — Patient Instructions (Signed)
Continue weighing daily and call for an overnight weight gain of > 2 pounds or a weekly weight gain of >5 pounds. 

## 2016-11-16 NOTE — ED Provider Notes (Signed)
[facility] Emergency Department Provider Note  ____________________________________________   First MD Initiated Contact with Patient 11/16/16 1114     (approximate)  I have reviewed the triage vital signs and the nursing notes.   HISTORY  Chief Complaint Hypertension and Chest Pain    HPI Trevor Nunez is a 51 y.o. male who self presents to the emergency department with chest pain and shortness of breath. He went to his doctor's office today and advised him to come to the emergency department. He has a past medical history of diastolic heart failure and says that over the last several months he has unintentionally gained 20 pounds.He only sleeps on 2 pillows but cannot lie flat. His chest pain is sharp and stabbing beginning in his left chest and radiating to his back. It began insidiously and has been slowly progressive. It was not sudden onset. It is not ripping or tearing.  06/15/16 Echo: Study Conclusions  - Left ventricle: The cavity size was normal. Wall thickness was   increased in a pattern of severe LVH. Systolic function was   vigorous. The estimated ejection fraction was in the range of 65%   to 70%. Regional wall motion abnormalities cannot be excluded.   Features are consistent with a pseudonormal left ventricular   filling pattern, with concomitant abnormal relaxation and   increased filling pressure (grade 2 diastolic dysfunction). - Aortic valve: Structurally normal valve. There was trivial   regurgitation. - Left atrium: The atrium was mildly dilated. - Right ventricle: The cavity size was normal. Wall thickness was   mildly increased. Systolic function was normal. - Right atrium: The atrium was mildly to moderately dilated.   Past Medical History:  Diagnosis Date  . Chronic diastolic CHF (congestive heart failure) (HCC)   . CKD (chronic kidney disease), stage III   . H/O medication noncompliance   . Hypertension     Patient  Active Problem List   Diagnosis Date Noted  . CHF (congestive heart failure) (HCC) 11/16/2016  . HTN (hypertension) 11/08/2016  . Chest pain 06/25/2016  . Chronic diastolic heart failure (HCC) 06/25/2016  . Snoring 06/25/2016  . CKD (chronic kidney disease), stage III 06/15/2016  . Demand ischemia (HCC) 06/15/2016  . Pain and swelling of elbow   . Acute on chronic diastolic CHF (congestive heart failure) (HCC)   . H/O medication noncompliance   . Chest pain with moderate risk for cardiac etiology 10/17/2015  . Accelerated hypertension 10/17/2015  . AKI (acute kidney injury) (HCC) 10/17/2015    Past Surgical History:  Procedure Laterality Date  . CHOLECYSTECTOMY    . HERNIA REPAIR      Prior to Admission medications   Medication Sig Start Date End Date Taking? Authorizing Provider  furosemide (LASIX) 40 MG tablet Take 1 tablet (40 mg total) by mouth daily. 11/08/16  Yes Hackney, Tina A, FNP  hydrochlorothiazide (HYDRODIURIL) 50 MG tablet Take 50 mg by mouth daily.   Yes [provider]  lisinopril (PRINIVIL,ZESTRIL) 20 MG tablet Take 1 tablet (20 mg total) by mouth daily. 11/08/16  Yes Clarisa KindredHackney, Tina A, FNP  metoprolol tartrate (LOPRESSOR) 50 MG tablet Take 1 tablet (50 mg total) by mouth 2 (two) times daily. 11/08/16  Yes Hackney, Inetta Fermoina A, FNP  nitroGLYCERIN (NITROSTAT) 0.4 MG SL tablet Place 1 tablet (0.4 mg total) under the tongue every 5 (five) minutes as needed for chest pain. 10/20/15  Yes Enedina FinnerPatel, Sona, MD  potassium chloride SA (K-DUR,KLOR-CON) 20 MEQ tablet Take  2 tablets (40 mEq total) by mouth daily. While taking metolazone 11/11/16 02/09/17 Yes Delma FreezeHackney, Tina A, FNP    Allergies Codeine  Family History  Problem Relation Age of Onset  . Hypertension Mother   . Heart attack Mother   . CVA Mother   . Heart disease Sister     Social History Social History  Substance Use Topics  . Smoking status: Former Smoker    Quit date: 08/17/2015  . Smokeless tobacco: Never  Used  . Alcohol use No    Review of Systems Constitutional: No fever/chills Eyes: No visual changes. ENT: No sore throat. Cardiovascular: Positive chest pain. Respiratory: Positive shortness of breath. Gastrointestinal: No abdominal pain.  No nausea, no vomiting.  No diarrhea.  No constipation. Genitourinary: Negative for dysuria. Musculoskeletal: Negative for back pain. Skin: Negative for rash. Neurological: Negative for headaches, focal weakness or numbness.   ____________________________________________   PHYSICAL EXAM:  VITAL SIGNS: ED Triage Vitals  Enc Vitals Group     BP 11/16/16 1037 (!) 219/142     Pulse Rate 11/16/16 1037 77     Resp 11/16/16 1037 18     Temp 11/16/16 1037 98.4 F (36.9 C)     Temp Source 11/16/16 1037 Oral     SpO2 11/16/16 1037 97 %     Weight 11/16/16 1038 274 lb (124.3 kg)     Height --      Head Circumference --      Peak Flow --      Pain Score 11/16/16 1037 8     Pain Loc --      Pain Edu? --      Excl. in GC? --     Constitutional: Alert and oriented 4 appears somewhat uncomfortable nontoxic no diaphoresis speaks in full clear sentences Eyes: PERRL EOMI. Head: Atraumatic. Nose: No congestion/rhinnorhea. Mouth/Throat: No trismus Neck: No stridor.   Cardiovascular: Blood pressure and right arm 215/145 blood pressure in left arm 197/145 Irregular heart rate regular rate no murmurs appreciated. Unable to lie flat whatsoever Respiratory: Normal respiratory effort.  No retractions. Crackles at bilateral bases Gastrointestinal: Soft nontender Musculoskeletal: Legs are equal in size mild edema bilaterally Neurologic:  Normal speech and language. No gross focal neurologic deficits are appreciated. Skin:  Skin is warm, dry and intact. No rash noted. Psychiatric: Mood and affect are normal. Speech and behavior are normal.    ____________________________________________   DIFFERENTIAL includes but not limited to  Congestive heart  failure, fluid overload, aortic dissection, acute coronary syndrome, metabolic derangement ____________________________________________   LABS (all labs ordered are listed, but only abnormal results are displayed)  Labs Reviewed  BASIC METABOLIC PANEL - Abnormal; Notable for the following:       Result Value   Glucose, Bld 104 (*)    BUN 34 (*)    Creatinine, Ser 1.80 (*)    GFR calc non Af Amer 42 (*)    GFR calc Af Amer 48 (*)    All other components within normal limits  TROPONIN I - Abnormal; Notable for the following:    Troponin I 0.08 (*)    All other components within normal limits  BRAIN NATRIURETIC PEPTIDE - Abnormal; Notable for the following:    B Natriuretic Peptide 214.0 (*)    All other components within normal limits  CBC  HEPATIC FUNCTION PANEL  LIPID PANEL    Elevated troponin is nonspecific and could be secondary to demand __________________________________________  EKG  ED ECG REPORT  Wells Guiles, the attending physician, personally viewed and interpreted this ECG.  Date: 11/16/2016 EKG Time:1038 Rate: 83 Rhythm: normal sinus rhythm QRS Axis: normal Intervals: normal ST/T Wave abnormalities: Concave up ST elevation in V1 only Narrative Interpretation: Abnormal EKG normal sinus rhythm with frequent premature ventricular contractions and concave up ST elevation in V1 _______________________________________  RADIOLOGY  IMPRESSION: Cardiomegaly. Aortic atherosclerosis. 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 ____________________________________________   PROCEDURES  Procedure(s) performed: no  Procedures  Critical Care performed: yes  CRITICAL CARE Performed by: Merrily Brittle   Total critical care time: 35 minutes  Critical care time was exclusive of separately billable procedures and treating other patients.  Critical care was necessary to treat or prevent imminent or life-threatening deterioration.  Critical care was  time spent personally by me on the following activities: development of treatment plan with patient and/or surrogate as well as nursing, discussions with consultants, evaluation of patient's response to treatment, examination of patient, obtaining history from patient or surrogate, ordering and performing treatments and interventions, ordering and review of laboratory studies, ordering and review of radiographic studies, pulse oximetry and re-evaluation of patient's condition.   Observation: no ____________________________________________   INITIAL IMPRESSION / ASSESSMENT AND PLAN / ED COURSE  Pertinent labs & imaging results that were available during my care of the patient were reviewed by me and considered in my medical decision making (see chart for details).  On arrival the patient is somewhat uncomfortable appearing and is unable to lie flat. He is unintentionally gained 20 pounds and his symptoms are consistent with fluid overload. I appreciate his elevated blood pressure and I'm concerned that this could represent hypertensive emergency. He will require intravenous drip of nitroglycerin to emergently bring down his blood pressure. She does have sharp chest pain radiating to his back, however it is not ripping or tearing and was not sudden onset as well as a chest x-ray that does not show widened mediastinum. He also has no pulse deficit and I do not believe he is having an aortic dissection.      ____________________________________________   FINAL CLINICAL IMPRESSION(S) / ED DIAGNOSES  Final diagnoses:  Acute on chronic diastolic congestive heart failure (HCC)  Accelerated hypertension  Elevated troponin      NEW MEDICATIONS STARTED DURING THIS VISIT:  New Prescriptions   No medications on file     Note:  This document was prepared using Dragon voice recognition software and may include unintentional dictation errors.     Merrily Brittle, MD 11/16/16 1224

## 2016-11-16 NOTE — Progress Notes (Signed)
Subjective:    Patient ID: Trevor Nunez, male    DOB: 06-Jun-1965, 51 y.o.   MRN: [id number]  HPI   Trevor Nunez is a 51 y/o male with a history of HTN, CKD (stage III), previous tobacco use and chronic heart failure.  Last echo was done 06/15/16 and showed an EF of 65-70% along with trivial AR and mildly increased right ventricle wall thickness. This is unchanged from previous one back in July 2017.  Was in the ED 09/21/16 due to pneumonia. Treated and released. Admitted 06/14/16 with HTN and HF exacerbation along with chest pain. Initially treated with IV diuretics. Had an elevated troponin thought to be due to demand ischemia. Had a stress test which was read as low risk by cardiology. Discharged home. Admitted 10/17/15 with HTN and HF exacerbation along with chest pain. Elevated troponin thought to be due to demand ischemia. Cardiology consult done. Discharged home.   He presents today for a follow-up visit with a chief complaint of continued moderate fatigue with little exertion. He describes this as having been present for years with varying levels of severity. He does feel like his fatigue has worsened over the last few months. He has associated shortness of breath, chest pain, palpitations, leg swelling, headache and dizziness along with this.   Past Medical History:  Diagnosis Date  . Chronic diastolic CHF (congestive heart failure) (HCC)   . CKD (chronic kidney disease), stage III   . H/O medication noncompliance   . Hypertension    Past Surgical History:  Procedure Laterality Date  . CHOLECYSTECTOMY    . HERNIA REPAIR     Family History  Problem Relation Age of Onset  . Hypertension Mother   . Heart attack Mother   . CVA Mother   . Heart disease Sister    Social History  Substance Use Topics  . Smoking status: Former Smoker    Quit date: 08/17/2015  . Smokeless tobacco: Never Used  . Alcohol use No    Allergies  Allergen Reactions  . Codeine Palpitations   Prior to  Admission medications   Medication Sig Start Date End Date Taking? Authorizing Provider  furosemide (LASIX) 40 MG tablet Take 1 tablet (40 mg total) by mouth daily. 11/08/16  Yes Jaima Janney A, FNP  hydrochlorothiazide (HYDRODIURIL) 50 MG tablet Take 50 mg by mouth daily.   Yes [provider]  lisinopril (PRINIVIL,ZESTRIL) 20 MG tablet Take 1 tablet (20 mg total) by mouth daily. 11/08/16  Yes Clarisa KindredHackney, Yuktha Kerchner A, FNP  metoprolol tartrate (LOPRESSOR) 50 MG tablet Take 1 tablet (50 mg total) by mouth 2 (two) times daily. 11/08/16  Yes Arush Gatliff, Inetta Fermoina A, FNP  nitroGLYCERIN (NITROSTAT) 0.4 MG SL tablet Place 1 tablet (0.4 mg total) under the tongue every 5 (five) minutes as needed for chest pain. 10/20/15  Yes Enedina FinnerPatel, Sona, MD    Review of Systems  Constitutional: Positive for fatigue. Negative for appetite change.  HENT: Negative for congestion, postnasal drip and sore throat.   Eyes: Negative.   Respiratory: Positive for shortness of breath. Negative for cough and chest tightness.   Cardiovascular: Positive for chest pain (this weekend and again this morning), palpitations and leg swelling.  Gastrointestinal: Negative for abdominal distention and abdominal pain.  Endocrine: Negative.   Genitourinary: Negative.   Musculoskeletal: Positive for arthralgias (both knees). Negative for back pain.  Skin: Negative.   Allergic/Immunologic: Negative.   Neurological: Positive for dizziness (sometimes) and headaches. Negative for light-headedness.  Hematological: Negative  for adenopathy. Does not bruise/bleed easily.  Psychiatric/Behavioral: Positive for sleep disturbance (not sleeping well due to breathing; sleeping in the recliner). Negative for dysphoric mood and suicidal ideas. The patient is not nervous/anxious.    Vitals:   11/16/16 0951  BP: (!) 188/141  Pulse: 81  Resp: 20  SpO2: 98%  Weight: 274 lb 8 oz (124.5 kg)  Height: 6' (1.829 m)   Wt Readings from Last 3 Encounters:  11/16/16 274  lb 8 oz (124.5 kg)  11/11/16 275 lb 8 oz (125 kg)  11/08/16 275 lb (124.7 kg)    Lab Results  Component Value Date   CREATININE 1.87 (H) 11/11/2016   CREATININE 2.02 (H) 09/21/2016   CREATININE 1.71 (H) 06/17/2016      Objective:   Physical Exam  Constitutional: He is oriented to person, place, and time. He appears well-developed and well-nourished.  HENT:  Head: Normocephalic and atraumatic.  Neck: Normal range of motion. Neck supple.  Cardiovascular: Normal rate and regular rhythm.   Pulmonary/Chest: Effort normal. He has no wheezes. He has no rales.  Abdominal: Soft. He exhibits no distension. There is no tenderness.  Musculoskeletal: He exhibits edema (3+ pitting edema in bilateral lower legs).  Neurological: He is alert and oriented to person, place, and time.  Skin: Skin is warm and dry.  Psychiatric: He has a normal mood and affect. His behavior is normal. Thought content normal.  Nursing note and vitals reviewed.     Assessment & Plan:   1: Acute on chronic heart failure with preserved ejection fraction- - NYHA class III - remains moderately fluid overloaded - weighing daily and he was reminded to call for an overnight weight gain of >2 pounds or a weekly weight gain of >5 pounds - ReDS vest reading last week was 43% - not adding salt; encouraged to read food labels.  - instructed him to elevate his legs as much as possible; no longer working and is trying to get disability/medicaid - added 5mg  metolazone daily for  3 days along with potassium; patient says that during those 3 days, his edema improved but has subsequently gone back up  - waiting on paperwork from [facility] and charity care program  2: HTN- - bp elevated at 188/141 - says this weekend, it was 220's/156 and the top number stayed in the 200's - says that years ago, it had gotten as high as 330's/220's - had been taking diet supplement called meratrim but has recently stopped taking  it  Follow-up pending ER visit.  Based on continued elevated BP readings and continued edema in a patient now with headache and chest pain, will send patient to the ED for further evaluation/treatment. Discussed that with this continued HTN, he is at risk of stroke, MI and kidney disease.  ED triage nurse called and given report.

## 2016-11-16 NOTE — Consult Note (Signed)
Cardiology Consultation Note  Patient ID: Trevor Nunez, MRN: [id number], DOB/AGE: 53/26/1967 53 y.o. Admit date: 11/16/2016   Date of Consult: 11/16/2016 Primary Physician: Patient, No Pcp Per Primary Cardiologist: New to [facility] - consult by End Requesting Physician: Dr. Juliene PinaMody, MD  Chief Complaint: SOB/chest pain Reason for Consult: Chest pain  HPI: Trevor Senegalroy Firebaugh is a 51 y.o. male who is being seen today for the evaluation of chest pain at the request of Dr. Juliene PinaMody, MD. Patient has a h/o poorly controlled hypertension for at least 20 years with hypertensive heart disease, medication noncompliance, chronic diastolic heart failure, CKD stage III, and ongoing tobacco abuse who was sent to the ED from the [facility] of chest pain and SOB.   Patient was admitted in July 2017 fpr URI with poorly controlled hypertension with systolic blood pressure at that time of 210 systolic. At that time he was found to have a minimally elevated troponin of 0.3 that was felt to be supply demand ischemia in the setting of his malignant hypertension. Echo during that admission showed EF of 55-60%, no regional wall motion abnormalities, LV diastolic function parameters were normal, mildly dilated ascending aorta at 41 mm, mildly dilated aortic root at 38 mm, mild MR, left atrium mildly dilated, RV systolic function normal, PSP could not be accurately estimated. His blood pressure was controlled during that admission and outpatient follow-up was advised. Admitted in 05/2016 with SOB and chest pain in the setting of malignant hypertension. Troponin mildly elevated with a peak of 0.09 and flat trending. Echo showed a hyperdynamic LV SF with and EF of 65-70%, severe LVH, GR2DD, trivial AI, mildly dilated LA, RV cavity size was normal with normall wall thickness and systolic function. Right atrium was mildly to moderately dilated. Nuclear stress test was low risk with no significant ischemia, normal wall motion, and no EKG changes  concerning for ischemia with resting EKG showing PVCs, st/t changes in leads II, III, aVF, V5, V6. Since that admission he has only been followed by the [facility].   Patient was at his [facility] appointment on 7/31 with complaints of mostly constant chest pain and SOB with associated headache in the setting of malignant hypertension with SBP > 200 mmHg at home. He was recently out of his medications for "a couple of months" and reports just starting them back. This was admitted after him telling us multiple times he has never missed any medications. For the past 3 days he has noted left-sided chest pain that radiates to his left shoulder that has been mostly constant though the pain will come in waves. There has been some associated SOB with orthopnea and headaches. He reports having to sleep in a recliner for the past couple of months 2/2 SOB. Self reports prior weight of 250 pounds a couple months back that over the past couple of weeks has trended up to the 270 pound range. Because of patient's BP, chest pain, SOB, and headache noted at the [facility], he was sent to the ED.   Upon his arrival to [facility] ED he was noted to have a BP of 219/142, HR 77 bpm, oxygen saturation 97% room air, weight 274 pounds. BNP 214, Troponin 0.08, unremarkable cbc and lft, SCr 1.80, K+ 3.7. In the ED he was given ASA 81 mg x 1, hydralazine 10 mg x 1, and placed on a nitro gtt. BP has improved to 177/103 currently in the ED. Cardiology is asked to evaluate  his chest pain. Currently, note 8/10 chest pain that is reproducible to palpation on exam.     Past Medical History:  Diagnosis Date  . Chronic diastolic CHF (congestive heart failure) (HCC)   . CKD (chronic kidney disease), stage III   . H/O medication noncompliance   . Hypertension       Most Recent Cardiac Studies: As above   Surgical History:  Past Surgical History:  Procedure Laterality Date  . CHOLECYSTECTOMY    . HERNIA  REPAIR       Home Meds: Prior to Admission medications   Medication Sig Start Date End Date Taking? Authorizing Provider  furosemide (LASIX) 40 MG tablet Take 1 tablet (40 mg total) by mouth daily. 11/08/16  Yes Hackney, Tina A, FNP  hydrochlorothiazide (HYDRODIURIL) 50 MG tablet Take 50 mg by mouth daily.   Yes [provider]  lisinopril (PRINIVIL,ZESTRIL) 20 MG tablet Take 1 tablet (20 mg total) by mouth daily. 11/08/16  Yes Clarisa Kindred A, FNP  metoprolol tartrate (LOPRESSOR) 50 MG tablet Take 1 tablet (50 mg total) by mouth 2 (two) times daily. 11/08/16  Yes Hackney, Inetta Fermo A, FNP  nitroGLYCERIN (NITROSTAT) 0.4 MG SL tablet Place 1 tablet (0.4 mg total) under the tongue every 5 (five) minutes as needed for chest pain. 10/20/15  Yes Enedina Finner, MD  potassium chloride SA (K-DUR,KLOR-CON) 20 MEQ tablet Take 2 tablets (40 mEq total) by mouth daily. While taking metolazone 11/11/16 02/09/17 Yes Delma Freeze, FNP    Inpatient Medications:  . aspirin  81 mg Oral Daily  . lisinopril  40 mg Oral Daily  . metoprolol tartrate  50 mg Oral BID     Allergies:  Allergies  Allergen Reactions  . Codeine Palpitations    Social History   Social History  . Marital status: Single    Spouse name: N/A  . Number of children: N/A  . Years of education: N/A   Occupational History  . medtech nurse at [employer]    Social History Main Topics  . Smoking status: Former Smoker    Quit date: 08/17/2015  . Smokeless tobacco: Never Used  . Alcohol use No  . Drug use: No  . Sexual activity: Not on file   Other Topics Concern  . Not on file   Social History Narrative  . No narrative on file     Family History  Problem Relation Age of Onset  . Hypertension Mother   . Heart attack Mother   . CVA Mother   . Heart disease Sister      Review of Systems: Review of Systems  Constitutional: Positive for malaise/fatigue. Negative for chills, diaphoresis, fever and weight loss.  HENT:  Negative for congestion.   Eyes: Negative for discharge and redness.  Respiratory: Positive for shortness of breath. Negative for cough, hemoptysis, sputum production and wheezing.   Cardiovascular: Positive for chest pain, orthopnea and leg swelling. Negative for palpitations, claudication and PND.  Gastrointestinal: Negative for abdominal pain, blood in stool, heartburn, melena, nausea and vomiting.  Genitourinary: Negative for hematuria.  Musculoskeletal: Positive for back pain. Negative for falls and myalgias.  Skin: Negative for rash.  Neurological: Negative for dizziness, tingling, tremors, sensory change, speech change, focal weakness, loss of consciousness and weakness.  Endo/Heme/Allergies: Does not bruise/bleed easily.  Psychiatric/Behavioral: Negative for substance abuse. The patient is not nervous/anxious.   All other systems reviewed and are negative.   Labs:  Recent Labs  11/16/16 1040  TROPONINI 0.08*  Lab Results  Component Value Date   WBC 6.8 11/16/2016   HGB 15.5 11/16/2016   HCT 46.2 11/16/2016   MCV 86.9 11/16/2016   PLT 234 11/16/2016     Recent Labs Lab 11/16/16 1040  NA 142  K 3.7  CL 108  CO2 24  BUN 34*  CREATININE 1.80*  CALCIUM 9.0  PROT 7.7  BILITOT 1.0  ALKPHOS 80  ALT 17  AST 20  GLUCOSE 104*   Lab Results  Component Value Date   CHOL 134 06/15/2016   HDL 35 (L) 06/15/2016   LDLCALC 82 06/15/2016   TRIG 83 06/15/2016   No results found for: DDIMER  Radiology/Studies:  Dg Chest 2 View  Result Date: 11/16/2016 IMPRESSION: Cardiomegaly. Aortic atherosclerosis. 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 Electronically Signed   By: Paulina Fusi M.D.   On: 11/16/2016 11:05    EKG: Interpreted by me showed: NSR, 83 bpm, occasional PVCs, LAFB, LVH, poor R wave progression, prolonged QTc, nonspecific lateral st/t changes Telemetry: Interpreted by me showed: NSR  Weights: Filed Weights   11/16/16 1038  Weight: 274 lb (124.3  kg)     Physical Exam: Blood pressure (!) 158/106, pulse 85, temperature 98.4 F (36.9 C), temperature source Oral, resp. rate 16, weight 274 lb (124.3 kg), SpO2 95 %. Body mass index is 37.16 kg/m. General: Well developed, well nourished, in no acute distress. Head: Normocephalic, atraumatic, sclera non-icteric, no xanthomas, nares are without discharge.  Neck: Negative for carotid bruits. JVD not elevated. Lungs: Clear bilaterally to auscultation without wheezes, rales, or rhonchi. Breathing is unlabored. Heart: RRR with S1 S2. No murmurs, rubs, or gallops appreciated. Palpation of the anterior chest wall fully reproduces the patient's pain.  Abdomen: Soft, non-tender, non-distended with normoactive bowel sounds. No hepatomegaly. No rebound/guarding. No obvious abdominal masses. Msk:  Strength and tone appear normal for age. Extremities: No clubbing or cyanosis. Trace to 1+ LE pitting edema to the bilateral knees. Distal pedal pulses are 2+ and equal bilaterally. Neuro: Alert and oriented X 3. No facial asymmetry. No focal deficit. Moves all extremities spontaneously. Psych:  Responds to questions appropriately with a normal affect.    Assessment and Plan:  Principal Problem:   Malignant hypertension Active Problems:   Acute on chronic diastolic CHF (congestive heart failure) (HCC)   H/O medication noncompliance   Chest pain with moderate risk for cardiac etiology   CKD (chronic kidney disease), stage III   Demand ischemia (HCC)   Obesity    1. Elevated troponin/chest pain: -Chest pain is fully reproducible to palpation of the anterior chest wall on exam -Given his malignant hypertension it is difficult to rule out aortic dissection though this would not lead to fully reproducible pain on palpation and he is quite stable at this time -His renal function makes CTA of the aorta less favorable as well -Could consider non contrast MRA of the aorta, will defer to IM -Recent normal  nuc in 05/2016 -Continue to trend troponin, if remains minimally elevated and flat trending, would not place on heparin gtt or pursue inpatient ischemic evaluation (in par to due to his noncompliance)  -ASA -Lopressor  2. Malignant hypertension: -Likely 2/2 medication and dietary noncompliance -Suspect he is not taking his home medications -Would restart his home medications and watch for improvement in his BP -Would wean off nitro gtt -Consider adding amlodipine 5 mg daily -Consider stoping HCTZ and simply just increase his PTA Lasix -Recommend urine drug screen, defer to IM -  Will defer these changes to IM  3. Acute on chronic diastolic CHF: -IV Lasix with KCl repletion per IM -No indication to repeat echo given recent study done in 05/2016 -Likely exacerbated by his medication and dietary noncompliance in the setting of malignant hypertension  4. CKD stage III: -Stable -Monitor with diuresis  5. Noncompliance: -Likely the biggest contributing factor to the above -Compliance is advised -Consider paramedicine follow up   Signed, Eula Listenyan Jameka Ivie, PA-C [facility] Pager: [phone] 11/16/2016, 1:15 PM

## 2016-11-16 NOTE — ED Notes (Signed)
Cardiology at bedside.

## 2016-11-17 ENCOUNTER — Inpatient Hospital Stay: Admit: 2016-11-17 | Payer: Medicaid Other

## 2016-11-17 ENCOUNTER — Inpatient Hospital Stay (HOSPITAL_COMMUNITY)
Admit: 2016-11-17 | Discharge: 2016-11-17 | Disposition: A | Discharge status: Home / Self Care | Payer: Medicaid Other | Attending: Physician Assistant | Admitting: Physician Assistant

## 2016-11-17 DIAGNOSIS — Z9114 Patient's other noncompliance with medication regimen: Secondary | ICD-10-CM

## 2016-11-17 DIAGNOSIS — R079 Chest pain, unspecified: Secondary | ICD-10-CM

## 2016-11-17 DIAGNOSIS — I351 Nonrheumatic aortic (valve) insufficiency: Secondary | ICD-10-CM

## 2016-11-17 DIAGNOSIS — R748 Abnormal levels of other serum enzymes: Secondary | ICD-10-CM

## 2016-11-17 LAB — URINE DRUG SCREEN, QUALITATIVE (ARMC ONLY)
Amphetamines, Ur Screen: NOT DETECTED
BARBITURATES, UR SCREEN: NOT DETECTED
BENZODIAZEPINE, UR SCRN: NOT DETECTED
CANNABINOID 50 NG, UR ~~LOC~~: NOT DETECTED
Cocaine Metabolite,Ur ~~LOC~~: NOT DETECTED
MDMA (Ecstasy)Ur Screen: NOT DETECTED
Methadone Scn, Ur: NOT DETECTED
Opiate, Ur Screen: POSITIVE — AB
PHENCYCLIDINE (PCP) UR S: NOT DETECTED
TRICYCLIC, UR SCREEN: NOT DETECTED

## 2016-11-17 LAB — BASIC METABOLIC PANEL
Anion gap: 8 (ref 5–15)
BUN: 34 mg/dL — ABNORMAL HIGH (ref 6–20)
CALCIUM: 9 mg/dL (ref 8.9–10.3)
CO2: 25 mmol/L (ref 22–32)
CREATININE: 1.85 mg/dL — AB (ref 0.61–1.24)
Chloride: 103 mmol/L (ref 101–111)
GFR calc Af Amer: 47 mL/min — ABNORMAL LOW (ref >60)
GFR, EST NON AFRICAN AMERICAN: 40 mL/min — AB (ref >60)
Glucose, Bld: 119 mg/dL — ABNORMAL HIGH (ref 65–99)
Potassium: 3.6 mmol/L (ref 3.5–5.1)
SODIUM: 136 mmol/L (ref 135–145)

## 2016-11-17 LAB — TSH: TSH: 2.931 u[IU]/mL (ref 0.350–4.500)

## 2016-11-17 LAB — CBC
HCT: 43.1 % (ref 40.0–52.0)
Hemoglobin: 14.6 g/dL (ref 13.0–18.0)
MCH: 29.4 pg (ref 26.0–34.0)
MCHC: 33.8 g/dL (ref 32.0–36.0)
MCV: 86.8 fL (ref 80.0–100.0)
PLATELETS: 229 10*3/uL (ref 150–440)
RBC: 4.96 MIL/uL (ref 4.40–5.90)
RDW: 13.4 % (ref 11.5–14.5)
WBC: 7.6 10*3/uL (ref 3.8–10.6)

## 2016-11-17 LAB — HIV ANTIBODY (ROUTINE TESTING W REFLEX): HIV SCREEN 4TH GENERATION: NONREACTIVE

## 2016-11-17 LAB — TROPONIN I: TROPONIN I: 0.08 ng/mL — AB (ref <0.03)

## 2016-11-17 MED ORDER — TORSEMIDE 20 MG PO TABS
20.0000 mg | ORAL_TABLET | Freq: Two times a day (BID) | ORAL | Status: DC
  Administered 2016-11-17 – 2016-11-18 (×2): 20 mg via ORAL
  Filled 2016-11-17 (×2): qty 1

## 2016-11-17 MED ORDER — TORSEMIDE 20 MG PO TABS: 20.0000 mg | ORAL_TABLET | Freq: Two times a day (BID) | ORAL | 2 refills | Status: DC

## 2016-11-17 MED ORDER — POTASSIUM CHLORIDE CRYS ER 20 MEQ PO TBCR
30.0000 meq | EXTENDED_RELEASE_TABLET | Freq: Every day | ORAL | Status: DC
  Administered 2016-11-17 – 2016-11-18 (×2): 30 meq via ORAL
  Filled 2016-11-17 (×2): qty 1

## 2016-11-17 MED ORDER — ASPIRIN 81 MG PO CHEW: 81.0000 mg | CHEWABLE_TABLET | Freq: Every day | ORAL | 2 refills | Status: DC

## 2016-11-17 MED ORDER — POTASSIUM CHLORIDE CRYS ER 20 MEQ PO TBCR: 20.0000 meq | EXTENDED_RELEASE_TABLET | Freq: Every day | ORAL | 2 refills | Status: DC

## 2016-11-17 MED ORDER — ISOSORBIDE MONONITRATE ER 30 MG PO TB24: 30.0000 mg | ORAL_TABLET | Freq: Two times a day (BID) | ORAL | 2 refills | Status: DC

## 2016-11-17 MED ORDER — LISINOPRIL 20 MG PO TABS: 20.0000 mg | ORAL_TABLET | Freq: Every day | ORAL | 2 refills | Status: DC

## 2016-11-17 MED ORDER — IBUPROFEN 400 MG PO TABS
400.0000 mg | ORAL_TABLET | Freq: Once | ORAL | Status: AC
  Administered 2016-11-17: 400 mg via ORAL
  Filled 2016-11-17: qty 1

## 2016-11-17 MED ORDER — ISOSORBIDE MONONITRATE ER 30 MG PO TB24
30.0000 mg | ORAL_TABLET | Freq: Two times a day (BID) | ORAL | Status: DC
  Administered 2016-11-17 – 2016-11-18 (×3): 30 mg via ORAL
  Filled 2016-11-17 (×3): qty 1

## 2016-11-17 MED ORDER — AMLODIPINE BESYLATE 5 MG PO TABS
5.0000 mg | ORAL_TABLET | Freq: Every day | ORAL | Status: DC
  Administered 2016-11-17: 5 mg via ORAL
  Filled 2016-11-17: qty 1

## 2016-11-17 MED ORDER — METOPROLOL TARTRATE 50 MG PO TABS: 50.0000 mg | ORAL_TABLET | Freq: Two times a day (BID) | ORAL | 2 refills | Status: DC

## 2016-11-17 MED ORDER — LABETALOL HCL 5 MG/ML IV SOLN
10.0000 mg | INTRAVENOUS | Status: DC | PRN
  Administered 2016-11-17: 10 mg via INTRAVENOUS
  Filled 2016-11-17: qty 4

## 2016-11-17 NOTE — Progress Notes (Signed)
[employer] [facility] [facility] [place] [facility]   PATIENT NAME: Trevor Nunez    MR#:  [id number]  DATE OF BIRTH:  11-Oct-1965  SUBJECTIVE:  CHIEF COMPLAINT:   Chief Complaint  Patient presents with  . Hypertension  . Chest Pain   - Admitted for chest pain, back pain and elevated blood pressure. -Blood pressure is better today. Chest pain is secondary to musculoskeletal/costochondritis due to being very tender to touch. -Also complains of lower extremity swelling  REVIEW OF SYSTEMS:  Review of Systems  Constitutional: Positive for malaise/fatigue. Negative for chills and fever.  HENT: Negative for ear discharge, ear pain, hearing loss and nosebleeds.   Eyes: Negative for blurred vision and double vision.  Respiratory: Negative for cough, shortness of breath and wheezing.   Cardiovascular: Positive for chest pain and leg swelling. Negative for palpitations.  Gastrointestinal: Negative for abdominal pain, constipation, diarrhea, nausea and vomiting.  Genitourinary: Negative for dysuria and urgency.  Musculoskeletal: Negative for myalgias.  Neurological: Negative for dizziness, speech change, focal weakness, seizures and headaches.  Psychiatric/Behavioral: Negative for depression.    DRUG ALLERGIES:   Allergies  Allergen Reactions  . Codeine Palpitations    VITALS:  Blood pressure (!) 155/102, pulse 65, temperature 98.2 F (36.8 C), temperature source Oral, resp. rate (!) 2, weight 120.4 kg (265 lb 8 oz), SpO2 96 %.  PHYSICAL EXAMINATION:  Physical Exam  GENERAL:  51 y.o.-year-old patient lying in the bed with no acute distress.  EYES: Pupils equal, round, reactive to light and accommodation. No scleral icterus. Extraocular muscles intact.  HEENT: Head atraumatic, normocephalic. Oropharynx and nasopharynx clear.  NECK:  Supple, no jugular venous distention. No thyroid enlargement, no tenderness.  LUNGS: Normal breath sounds bilaterally, no wheezing, rales,rhonchi  or crepitation. No use of accessory muscles of respiration.  CARDIOVASCULAR: S1, S2 normal. No murmurs, rubs, or gallops. At the costochondral junction on the left side, there is a tender spot which is causing his pain ABDOMEN: Soft, nontender, nondistended. Bowel sounds present. No organomegaly or mass.  EXTREMITIES: No cyanosis, or clubbing. 1+ bilateral lower extremity edema noted NEUROLOGIC: Cranial nerves II through XII are intact. Muscle strength 5/5 in all extremities. Sensation intact. Gait not checked.  PSYCHIATRIC: The patient is alert and oriented x 3.  SKIN: No obvious rash, lesion, or ulcer.    LABORATORY PANEL:   CBC  Recent Labs Lab 11/17/16 0141  WBC 7.6  HGB 14.6  HCT 43.1  PLT 229   ------------------------------------------------------------------------------------------------------------------  Chemistries   Recent Labs Lab 11/16/16 1040  11/16/16 1944 11/17/16 0141  NA 142  --   --  136  K 3.7  --  3.6 3.6  CL 108  --   --  103  CO2 24  --   --  25  GLUCOSE 104*  --   --  119*  BUN 34*  --   --  34*  CREATININE 1.80*  < >  --  1.85*  CALCIUM 9.0  --   --  9.0  MG  --   --  2.0  --   AST 20  --   --   --   ALT 17  --   --   --   ALKPHOS 80  --   --   --   BILITOT 1.0  --   --   --   < > = values in this interval not displayed. ------------------------------------------------------------------------------------------------------------------  Cardiac Enzymes  Recent Labs Lab 11/17/16  0141  TROPONINI 0.08*   ------------------------------------------------------------------------------------------------------------------  RADIOLOGY:  Dg Chest 2 View  Result Date: 11/16/2016 CLINICAL DATA:  Hypertension and chest pain EXAM: CHEST  2 VIEW COMPARISON:  09/21/2016 FINDINGS: Cardiomegaly. Aortic tortuosity. 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 No effusions. No focal lesion. No significant bone finding. IMPRESSION: Cardiomegaly. Aortic  atherosclerosis. 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 Electronically Signed   By: Paulina FusiMark  Shogry M.D.   On: 11/16/2016 11:05    EKG:   Orders placed or performed during the hospital encounter of 11/16/16  . ED EKG within 10 minutes  . ED EKG within 10 minutes  . EKG 12-Lead  . EKG 12-Lead    ASSESSMENT AND PLAN:   51 year old male with past medical history significant for diastolic heart failure, CK D stage III, ongoing smoking, noncompliance with medications and diet, hypertension presents to hospital secondary to chest pain and uncontrolled blood pressure.  #1 malignant hypertension-concern for noncompliance with salt restriction. -Educated again. Discontinued hydrochlorothiazide. Due to his lower extremity edema, will hold off on Norvasc -Continue lisinopril, metoprolol. Torsemide, Imdur added -Off the nitroglycerin drip. Continue to monitor.  #2 chest pain-costochondritis. Tender to touch in the chest. -CT angiogram to rule out dissection cannot be done due to his CK D, MRA of the aorta ordered but patient refused to lay flat in MRI -Since its low suspicion at this time, we'll do an echocardiogram to rule out. If needed, will need a TEE -Continue muscle relaxants and pain medications for pain control  #3 elevated troponin-stable. Since recent stress test was negative, no plans to repeat further ischemic workup in the hospital.  #4 CK D stage III-secondary to likely hypertensive nephrosclerosis. Monitor at this time closely. No worsening noted.  #5 diastolic CHF-has lower extremity edema, possibly related to CHF exacerbation-acute on chronic. -Also secondary to venous insufficiency. -Change IV Lasix to oral torsemide at this time. Monitor closely  #6 DVT prophylaxis-Lovenox.  Encourage ambulation and anticipate discharge later today. -     All the records are reviewed and case discussed with Care Management/Social Workerr. Management plans discussed with the  patient, family and they are in agreement.  CODE STATUS: Full code  TOTAL TIME TAKING CARE OF THIS PATIENT: 38 minutes.   POSSIBLE D/C today or tomorrow , DEPENDING ON CLINICAL CONDITION.   Myan Locatelli M.D on 11/17/2016 at 10:00 AM  Between 7am to 6pm - Pager - [phone]  After 6pm go to [email or web address] - password [email or web address]  [facility]  Office  [phone]  CC: Primary care physician; Patient, No Pcp Per

## 2016-11-17 NOTE — Progress Notes (Signed)
Patient ambulated on room air and oxygen saturations remain 95% throughout.Oxygen saturation on room air at rest is 97%

## 2016-11-17 NOTE — Progress Notes (Signed)
Patient Name: Trevor Nunez Date of Encounter: 11/17/2016  [facility]  Hospital Problem List     Principal Problem:   Malignant hypertension Active Problems:   Acute on chronic diastolic CHF (congestive heart failure) (HCC)   H/O medication noncompliance   Chest pain with moderate risk for cardiac etiology   CKD (chronic kidney disease), stage III   Demand ischemia (HCC)   Obesity     Subjective   BP has improved to 120s systolic with continuation of home medications. Now off nitro gtt. Continues to note SOB and chest pain. Labs stable.   Inpatient Medications    Scheduled Meds: . aspirin  81 mg Oral Daily  . docusate sodium  100 mg Oral BID  . enoxaparin (LOVENOX) injection  40 mg Subcutaneous Q24H  . furosemide  40 mg Intravenous BID  . hydrochlorothiazide  50 mg Oral Daily  . lisinopril  20 mg Oral Daily  . metoprolol tartrate  50 mg Oral BID  . sodium chloride flush  3 mL Intravenous Q12H   Continuous Infusions: . sodium chloride     PRN Meds: sodium chloride, acetaminophen **OR** acetaminophen, hydrALAZINE, HYDROcodone-acetaminophen, labetalol, ondansetron **OR** ondansetron (ZOFRAN) IV, senna-docusate, sodium chloride flush   Vital Signs    Vitals:   11/16/16 2132 11/16/16 2342 11/17/16 0119 11/17/16 0538  BP: 136/73 (!) 142/99 136/87 127/84  Pulse: 79 65 68 69  Resp:    18  Temp:    98.1 F (36.7 C)  TempSrc:    Oral  SpO2: 93%   94%  Weight:    265 lb 8 oz (120.4 kg)    Intake/Output Summary (Last 24 hours) at 11/17/16 0719 Last data filed at 11/17/16 0546  Gross per 24 hour  Intake           109.33 ml  Output             1950 ml  Net         -1840.67 ml   Filed Weights   11/16/16 1038 11/17/16 0538  Weight: 274 lb (124.3 kg) 265 lb 8 oz (120.4 kg)    Physical Exam    GEN: Well nourished, well developed, in no acute distress.  HEENT: Grossly normal.  Neck: Supple, no JVD, carotid bruits, or masses. Cardiac: RRR, no  murmurs, rubs, or gallops. No clubbing, cyanosis, trace pre-tibial edema.  Radials/DP/PT 2+ and equal bilaterally. Palpation of the anterior chest wall reproduces his chest pain.  Respiratory:  Respirations regular and unlabored, clear to auscultation bilaterally. GI: Soft, nontender, nondistended, BS + x 4. MS: no deformity or atrophy. Skin: warm and dry, no rash. Neuro:  Strength and sensation are intact. Psych: AAOx3.  Normal affect.  Labs    CBC  Recent Labs  11/16/16 1346 11/17/16 0141  WBC 6.9 7.6  HGB 15.4 14.6  HCT 45.4 43.1  MCV 86.5 86.8  PLT 240 229   Basic Metabolic Panel  Recent Labs  11/16/16 1040 11/16/16 1346 11/16/16 1944 11/17/16 0141  NA 142  --   --  136  K 3.7  --  3.6 3.6  CL 108  --   --  103  CO2 24  --   --  25  GLUCOSE 104*  --   --  119*  BUN 34*  --   --  34*  CREATININE 1.80* 1.79*  --  1.85*  CALCIUM 9.0  --   --  9.0  MG  --   --  2.0  --    Liver Function Tests  Recent Labs  11/16/16 1040  AST 20  ALT 17  ALKPHOS 80  BILITOT 1.0  PROT 7.7  ALBUMIN 4.0   No results for input(s): LIPASE, AMYLASE in the last 72 hours. Cardiac Enzymes  Recent Labs  11/16/16 1346 11/16/16 1944 11/17/16 0141  TROPONINI 0.09* 0.09* 0.08*   BNP Invalid input(s): POCBNP D-Dimer No results for input(s): DDIMER in the last 72 hours. Hemoglobin A1C No results for input(s): HGBA1C in the last 72 hours. Fasting Lipid Panel  Recent Labs  11/16/16 1346  CHOL 204*  HDL 42  LDLCALC 143*  TRIG 96  CHOLHDL 4.9   Thyroid Function Tests No results for input(s): TSH, T4TOTAL, T3FREE, THYROIDAB in the last 72 hours.  Invalid input(s): FREET3  Telemetry    NSR with frequent PVCs in a pattern of ventricular bigeminy - Personally Reviewed  ECG    n/a - Personally Reviewed  Radiology    Dg Chest 2 View  Result Date: 11/16/2016 IMPRESSION: Cardiomegaly. Aortic atherosclerosis. 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 Electronically  Signed   By: Paulina Fusi M.D.   On: 11/16/2016 11:05    Cardiac Studies   n/a  Patient Profile     51 y.o. male with history of poorly controlled hypertension for at least 20 years with hypertensive heart disease, medication noncompliance, chronic diastolic heart failure, CKD stage III, and ongoing tobacco abuse who was sent to the ED from the [facility] of chest pain and SOB.  Assessment & Plan    1. Elevated troponin/chest pain: -Chest pain is fully reproducible to palpation of the anterior chest wall on exam -Given his malignant hypertension it is difficult to rule out aortic dissection though this would not lead to fully reproducible pain on palpation and he is quite stable at this time -His renal function makes CTA of the aorta less favorable as well -Could consider non contrast MRA of the aorta, will defer to IM -Recent normal nuc in 05/2016 -Troponin with a flat trend -No indication forheparin gtt  -Will not pursue inpatient ischemic evaluation at this time given the atypical nature of his pain and in par to due to his noncompliance -ASA -Lopressor  2. Malignant hypertension: -Likely 2/2 medication and dietary noncompliance -He has been continued on his home medications with the addition of nitro gtt that is now weaned off and his BP is 120s systolic -If his BP remains well controlled on nitro gtt, this likely supports his noncompliance -Suspect he is not taking his home medications -Consider adding amlodipine 5 mg daily -Consider stoping HCTZ and simply just increase his PTA Lasix -Recommend urine drug screen, defer to IM -Will defer these changes to IM  3. Acute on chronic diastolic CHF: -LE swelling improving -IV Lasix with KCl repletion per IM -Likely exacerbated by his medication and dietary noncompliance in the setting of malignant hypertension  4. CKD stage III: -Stable -Monitor with diuresis  5. Noncompliance: -Likely the biggest contributing  factor to the above -Compliance is advised -Consider paramedicine follow up  6. Ventricular ectopy: -Magnesium at goal -Give KCl this morning -Check TSH -Check echo -Asymptomatic   Signed, Eula Listen, PA-C [facility] Pager: [phone] 11/17/2016, 7:19 AM   Attending Note Patient seen and examined, agree with detailed note above,  Patient presentation and plan discussed on rounds.   Long discussion with patient this morning, reports compliance with his Lasix Has been sleeping in recliner at  home for some time, unable to sleep supine in his bed secondary to shortness of breath, PND/orthopnea Reports high fluid intake  On arrival blood pressure very elevated Well controlled now on his medications Reports chronic lower extremity edema  Echocardiogram pending  On physical exam, JVP 12+, lungs clear to auscultation bilaterally, heart sounds regular with normal S1-S2 no murmurs appreciated, abdomen soft nontender, trace to 1+ edema to the mid shins  Lab work reviewed showing creatinine 1.85, BUN 34, potassium 3.6, troponin 0.08, normal CBC and TSH. Tox screen positive for opiates Creatinine at baseline   1. Elevated troponin/chest pain: -Chest pain reproducible to palpation -Will not pursue inpatient ischemic evaluation at this time  2. Malignant hypertension:  medication  noncompliance Would hold HCTZ given renal dysfunction, Avoid calcium channel blockers given leg swelling (likely component of chronic leg swelling) Will start isosorbide mononitrate 30 mg twice a day with hold parameters Continue lisinopril 20 mg daily Stressed importance of medication compliance  3. Acute on chronic diastolic CHF: -LE swelling improving Still with PND and orthopnea -IV Lasix with KCl repletion  At the time of discharge would send on torsemide 20 twice a day, stopped Lasix, stop HCTZ  4. CKD stage III: Creatinine 1.8, baseline  5. Noncompliance: -Likely the biggest  contributing factor to the above Stressed importance of dietary and medication compliance  Long discussion concerning monitoring his weight, calling CHF clinic or our office if he develops PND/orthopnea in the future Greater than 50% was spent in counseling and coordination of care with patient Total encounter time 35 minutes or more   Signed: Dossie Arbourim Jarmar Rousseau  M.D., Ph.D. [facility]

## 2016-11-17 NOTE — Care Management (Addendum)
Patient is followed by the Heart Failure Clinic and says he is compliant.  He was provided application to [facility] on previous admission but did not follow through with comptetion of the application process because "they said I had to get my preacher to sign a paper about helping me with my meds and I didn't know why they needed that." Discussed reason for this requirement and strongly suggested to patient he complete the applications.  He verbalizes understanding.  Will anticipate sending discharge prescriptions to the [facility].  Patient is in the process of filing for disability.

## 2016-11-17 NOTE — Progress Notes (Signed)
Pt complaining of headache that norco did not help with, pt requesting advil. Dr. Cherlynn KaiserSainani to put in a one time dose of 400mg  only due to pt's creatinine being elevated.

## 2016-11-18 ENCOUNTER — Telehealth: Payer: Self-pay | Admitting: Internal Medicine

## 2016-11-18 DIAGNOSIS — G4733 Obstructive sleep apnea (adult) (pediatric): Secondary | ICD-10-CM

## 2016-11-18 LAB — ECHOCARDIOGRAM COMPLETE
HEIGHTINCHES: 72 in
Weight: 4248 oz

## 2016-11-18 MED ORDER — NITROGLYCERIN 0.4 MG SL SUBL: 0.4000 mg | SUBLINGUAL_TABLET | SUBLINGUAL | 1 refills | Status: AC | PRN

## 2016-11-18 MED ORDER — ASPIRIN 81 MG PO CHEW
81.0000 mg | CHEWABLE_TABLET | Freq: Every day | ORAL | 2 refills | Status: DC
Start: 2016-11-18 — End: 2017-01-31

## 2016-11-18 MED ORDER — LISINOPRIL 20 MG PO TABS: 20.0000 mg | ORAL_TABLET | Freq: Every day | ORAL | 2 refills | Status: DC

## 2016-11-18 MED ORDER — METOPROLOL TARTRATE 50 MG PO TABS: 50.0000 mg | ORAL_TABLET | Freq: Two times a day (BID) | ORAL | 2 refills | Status: DC

## 2016-11-18 MED ORDER — TORSEMIDE 20 MG PO TABS: 20.0000 mg | ORAL_TABLET | Freq: Two times a day (BID) | ORAL | 2 refills | Status: DC

## 2016-11-18 MED ORDER — BISACODYL 5 MG PO TBEC
10.0000 mg | DELAYED_RELEASE_TABLET | Freq: Once | ORAL | Status: AC
  Administered 2016-11-18: 10 mg via ORAL
  Filled 2016-11-18: qty 2

## 2016-11-18 MED ORDER — ISOSORBIDE MONONITRATE ER 30 MG PO TB24: 30.0000 mg | ORAL_TABLET | Freq: Two times a day (BID) | ORAL | 2 refills | Status: DC

## 2016-11-18 NOTE — Care Management (Signed)
Faxed patient's prescriptions to [facility].  Instructed patient to proceed to clinic when leaves unit and pick up meds.  Also instructed on completion of the [facility] applications.  Verbalized understanding

## 2016-11-18 NOTE — Telephone Encounter (Signed)
[facility]. Left message asking pt to call back regarding discharge instructions and/or medications. Advised pt of appt w/ Ward Givenshris Berge, NP on 12/07/16 at 8:30am w/ [facility]. Asked pt to call back if unable to keep this appt.

## 2016-11-18 NOTE — Discharge Instructions (Signed)
Heart Failure Clinic appointment on November 25 2016 at 9:40am with Trevor Kindredina Hackney, FNP. Please call [phone] to reschedule.

## 2016-11-18 NOTE — Discharge Summary (Signed)
[facility] [place] [facility]   PATIENT NAME: Trevor Nunez    MR#:  [id number]  DATE OF BIRTH:  [birth year]  DATE OF ADMISSION:  11/16/2016   ADMITTING PHYSICIAN: Adrian SaranSital Mody, MD  DATE OF DISCHARGE: 11/18/2016  2:33 PM  PRIMARY CARE PHYSICIAN: Patient, No Pcp Per   ADMISSION DIAGNOSIS:   Accelerated hypertension [I10] Elevated troponin [R74.8] Acute on chronic diastolic congestive heart failure (HCC) [I50.33]  DISCHARGE DIAGNOSIS:   Principal Problem:   Malignant hypertension Active Problems:   Chest pain with moderate risk for cardiac etiology   Acute on chronic diastolic CHF (congestive heart failure) (HCC)   H/O medication noncompliance   CKD (chronic kidney disease), stage III   Demand ischemia (HCC)   Obesity   SECONDARY DIAGNOSIS:   Past Medical History:  Diagnosis Date  . Chronic diastolic CHF (congestive heart failure) (HCC)   . CKD (chronic kidney disease), stage III   . H/O medication noncompliance   . Hypertension     HOSPITAL COURSE:   51 year old male with past medical history significant for diastolic heart failure, CK D stage III, ongoing smoking, noncompliance with medications and diet, hypertension presents to hospital secondary to chest pain and uncontrolled blood pressure.  #1 malignant hypertension-concern for noncompliance with meds and salt restriction. -Educated again. Discontinued hydrochlorothiazide. Due to his lower extremity edema, will hold off on Norvasc -Continue lisinopril, metoprolol. Torsemide, Imdur added With good blood pressure control -Off the nitroglycerin drip.  #2 chest pain-costochondritis. Tender to touch in the chest. -CT angiogram to rule out dissection cannot be done due to his CK D, MRA of the aorta ordered but patient refused to lay flat in MRI -Since its low suspicion at this time,  echocardiogram and no concerns for it..  -Continue muscle relaxants and pain medications for pain  control  #3 elevated troponin-stable. Since recent stress test was negative, no plans to repeat further ischemic workup in the hospital.  #4 CK D stage III-secondary to likely hypertensive nephrosclerosis. Monitor at this time closely. No worsening noted.  #5 diastolic CHF-has lower extremity edema,  -Echo with left ventricular hypertrophy, normal ejection fraction. Likely has underlying sleep apnea. Outpatient sleep study recommended -Also secondary to venous insufficiency. -Discharged on oral torsemide. Stressed again the importance of compliance with medications.  Discharge today  DISCHARGE CONDITIONS:   Guarded  CONSULTS OBTAINED:   Treatment Team:  Yvonne KendallEnd, Christopher, MD  DRUG ALLERGIES:   Allergies  Allergen Reactions  . Codeine Palpitations   DISCHARGE MEDICATIONS:   Allergies as of 11/18/2016      Reactions   Codeine Palpitations      Medication List    STOP taking these medications   furosemide 40 MG tablet Commonly known as:  LASIX   hydrochlorothiazide 50 MG tablet Commonly known as:  HYDRODIURIL     TAKE these medications   aspirin 81 MG chewable tablet Chew 1 tablet (81 mg total) by mouth daily.   isosorbide mononitrate 30 MG 24 hr tablet Commonly known as:  IMDUR Take 1 tablet (30 mg total) by mouth 2 (two) times daily.   lisinopril 20 MG tablet Commonly known as:  PRINIVIL,ZESTRIL Take 1 tablet (20 mg total) by mouth daily.   metoprolol tartrate 50 MG tablet Commonly known as:  LOPRESSOR Take 1 tablet (50 mg total) by mouth 2 (two) times daily.   nitroGLYCERIN 0.4 MG SL tablet Commonly known as:  NITROSTAT Place 1 tablet (0.4 mg total) under the tongue  every 5 (five) minutes as needed for chest pain.   potassium chloride SA 20 MEQ tablet Commonly known as:  K-DUR,KLOR-CON Take 1 tablet (20 mEq total) by mouth daily. While taking metolazone What changed:  how much to take   torsemide 20 MG tablet Commonly known as:  DEMADEX Take 1  tablet (20 mg total) by mouth 2 (two) times daily.        DISCHARGE INSTRUCTIONS:   1. Cardiology follow up in 1-2 weeks 2. PCP follow-up in 2 weeks  DIET:   Cardiac diet  ACTIVITY:   Activity as tolerated  OXYGEN:   Home Oxygen: No.  Oxygen Delivery: room air  DISCHARGE LOCATION:   home   If you experience worsening of your admission symptoms, develop shortness of breath, life threatening emergency, suicidal or homicidal thoughts you must seek medical attention immediately by calling 911 or calling your MD immediately  if symptoms less severe.  You Must read complete instructions/literature along with all the possible adverse reactions/side effects for all the Medicines you take and that have been prescribed to you. Take any new Medicines after you have completely understood and accpet all the possible adverse reactions/side effects.   Please note  You were cared for by a hospitalist during your hospital stay. If you have any questions about your discharge medications or the care you received while you were in the hospital after you are discharged, you can call the unit and asked to speak with the hospitalist on call if the hospitalist that took care of you is not available. Once you are discharged, your primary care physician will handle any further medical issues. Please note that NO REFILLS for any discharge medications will be authorized once you are discharged, as it is imperative that you return to your primary care physician (or establish a relationship with a primary care physician if you do not have one) for your aftercare needs so that they can reassess your need for medications and monitor your lab values.    On the day of Discharge:  VITAL SIGNS:   Blood pressure (!) 147/94, pulse 65, temperature 97.9 F (36.6 C), temperature source Oral, resp. rate 18, height 6' (1.829 m), weight 120.3 kg (265 lb 3.2 oz), SpO2 90 %.  PHYSICAL EXAMINATION:    GENERAL:  51  y.o.-year-old patient lying in the bed with no acute distress.  EYES: Pupils equal, round, reactive to light and accommodation. No scleral icterus. Extraocular muscles intact.  HEENT: Head atraumatic, normocephalic. Oropharynx and nasopharynx clear.  NECK:  Supple, no jugular venous distention. No thyroid enlargement, no tenderness.  LUNGS: Normal breath sounds bilaterally, no wheezing, rales,rhonchi or crepitation. No use of accessory muscles of respiration.  CARDIOVASCULAR: S1, S2 normal. No murmurs, rubs, or gallops. At the costochondral junction on the left side, there is a tender spot which is causing his pain ABDOMEN: Soft, non-tender, non-distended. Bowel sounds present. No organomegaly or mass.  EXTREMITIES: No pedal edema, cyanosis, or clubbing.  NEUROLOGIC: Cranial nerves II through XII are intact. Muscle strength 5/5 in all extremities. Sensation intact. Gait not checked.  PSYCHIATRIC: The patient is alert and oriented x 3.  SKIN: No obvious rash, lesion, or ulcer.   DATA REVIEW:   CBC  Recent Labs Lab 11/17/16 0141  WBC 7.6  HGB 14.6  HCT 43.1  PLT 229    Chemistries   Recent Labs Lab 11/16/16 1040  11/16/16 1944 11/17/16 0141  NA 142  --   --  136  K 3.7  --  3.6 3.6  CL 108  --   --  103  CO2 24  --   --  25  GLUCOSE 104*  --   --  119*  BUN 34*  --   --  34*  CREATININE 1.80*  < >  --  1.85*  CALCIUM 9.0  --   --  9.0  MG  --   --  2.0  --   AST 20  --   --   --   ALT 17  --   --   --   ALKPHOS 80  --   --   --   BILITOT 1.0  --   --   --   < > = values in this interval not displayed.   Microbiology Results  Results for orders placed or performed during the hospital encounter of 06/14/16  MRSA PCR Screening     Status: None   Collection Time: 06/14/16 10:32 PM  Result Value Ref Range Status   MRSA by PCR NEGATIVE NEGATIVE Final    Comment:        The GeneXpert MRSA Assay (FDA approved for NASAL specimens only), is one component of  a comprehensive MRSA colonization surveillance program. It is not intended to diagnose MRSA infection nor to guide or monitor treatment for MRSA infections.     RADIOLOGY:  No results found.   Management plans discussed with the patient, family and they are in agreement.  CODE STATUS:     Code Status Orders        Start     Ordered   11/16/16 1346  Full code  Continuous     11/16/16 1345    Code Status History    Date Active Date Inactive Code Status Order ID Comments User Context   06/14/2016  7:29 PM 06/17/2016  1:04 PM Full Code [id number]  Ihor AustinPyreddy, Pavan, MD Inpatient   10/17/2015 10:07 PM 10/21/2015  3:59 AM Full Code [id number]  Oralia ManisWillis, David, MD Inpatient      TOTAL TIME TAKING CARE OF THIS PATIENT: 37 minutes.    Deionte Spivack M.D on 11/18/2016 at 4:51 PM  Between 7am to 6pm - Pager - [phone]  After 6pm go to [email or web address] - Social research officer, [email or web address] [email or web address]  [facility] [place] [facility]  Office  [phone]  CC: Primary care physician; Patient, No Pcp Per   Note: This dictation was prepared with Dragon dictation along with smaller phrase technology. Any transcriptional errors that result from this process are unintentional.

## 2016-11-18 NOTE — Plan of Care (Signed)
Problem: Pain Managment: Goal: General experience of comfort will improve Outcome: Not Progressing Pt still complaining of headache and chest pain, treated headache once with advil, which helped and pt fell asleep, also treated chest pain this morning once with 2 norco, which also helped pt fall asleep . Will continue to monitor.

## 2016-11-18 NOTE — Progress Notes (Signed)
Patient Name: Trevor Nunez Date of Encounter: 11/18/2016  Primary Cardiologist: [facility]  Hospital Problem List     Principal Problem:   Malignant hypertension Active Problems:   Chest pain with moderate risk for cardiac etiology   Acute on chronic diastolic CHF (congestive heart failure) (HCC)   H/O medication noncompliance   CKD (chronic kidney disease), stage III   Demand ischemia (HCC)   Obesity     Subjective   Blood pressure improved on current regimen He is concerned about headache and shortness of breath episodes when sleeping Unable to lay flat, requires a recliner at nighttime to sleep Reports he is constipated   Inpatient Medications    Scheduled Meds: . aspirin  81 mg Oral Daily  . docusate sodium  100 mg Oral BID  . enoxaparin (LOVENOX) injection  40 mg Subcutaneous Q24H  . isosorbide mononitrate  30 mg Oral BID  . lisinopril  20 mg Oral Daily  . metoprolol tartrate  50 mg Oral BID  . potassium chloride  30 mEq Oral Daily  . sodium chloride flush  3 mL Intravenous Q12H  . torsemide  20 mg Oral BID   Continuous Infusions: . sodium chloride     PRN Meds: sodium chloride, acetaminophen **OR** acetaminophen, hydrALAZINE, HYDROcodone-acetaminophen, labetalol, ondansetron **OR** ondansetron (ZOFRAN) IV, senna-docusate, sodium chloride flush   Vital Signs    Vitals:   11/18/16 0333 11/18/16 0642 11/18/16 0949 11/18/16 1220  BP: (!) 142/68 135/88 (!) 145/93 (!) 147/94  Pulse: 85 62 64 65  Resp: 18 18 19 18   Temp: 97.6 F (36.4 C) 97.9 F (36.6 C)    TempSrc: Oral Oral    SpO2: 97% 94% 93% 90%  Weight:  265 lb 3.2 oz (120.3 kg)    Height:        Intake/Output Summary (Last 24 hours) at 11/18/16 1325 Last data filed at 11/18/16 0900  Gross per 24 hour  Intake              483 ml  Output             1800 ml  Net            -1317 ml   Filed Weights   11/16/16 1038 11/17/16 0538 11/18/16 0642  Weight: 274 lb (124.3 kg) 265 lb 8 oz (120.4 kg) 265  lb 3.2 oz (120.3 kg)    Physical Exam    GEN: Well nourished, well developed, in no acute distress, obese  HEENT: Grossly normal.  Neck: Supple, no JVD, carotid bruits, or masses. Cardiac: RRR, no murmurs, rubs, or gallops. No clubbing, cyanosis, trace pre-tibial edema.  Radials/DP/PT 2+ and equal bilaterally. Palpation of the anterior chest wall reproduces his chest pain.  Respiratory:  Respirations regular and unlabored, clear to auscultation bilaterally. GI: Soft, nontender, nondistended, BS + x 4. MS: no deformity or atrophy. Skin: warm and dry, no rash. Neuro:  Strength and sensation are intact. Psych: AAOx3.  Normal affect.  Labs    CBC  Recent Labs  11/16/16 1346 11/17/16 0141  WBC 6.9 7.6  HGB 15.4 14.6  HCT 45.4 43.1  MCV 86.5 86.8  PLT 240 229   Basic Metabolic Panel  Recent Labs  11/16/16 1040 11/16/16 1346 11/16/16 1944 11/17/16 0141  NA 142  --   --  136  K 3.7  --  3.6 3.6  CL 108  --   --  103  CO2 24  --   --  25  GLUCOSE 104*  --   --  119*  BUN 34*  --   --  34*  CREATININE 1.80* 1.79*  --  1.85*  CALCIUM 9.0  --   --  9.0  MG  --   --  2.0  --    Liver Function Tests  Recent Labs  11/16/16 1040  AST 20  ALT 17  ALKPHOS 80  BILITOT 1.0  PROT 7.7  ALBUMIN 4.0   No results for input(s): LIPASE, AMYLASE in the last 72 hours. Cardiac Enzymes  Recent Labs  11/16/16 1346 11/16/16 1944 11/17/16 0141  TROPONINI 0.09* 0.09* 0.08*   BNP Invalid input(s): POCBNP D-Dimer No results for input(s): DDIMER in the last 72 hours. Hemoglobin A1C No results for input(s): HGBA1C in the last 72 hours. Fasting Lipid Panel  Recent Labs  11/16/16 1346  CHOL 204*  HDL 42  LDLCALC 143*  TRIG 96  CHOLHDL 4.9   Thyroid Function Tests  Recent Labs  11/17/16 0141  TSH 2.931    Telemetry    NSR with frequent PVCs in a pattern of ventricular bigeminy - Personally Reviewed  ECG    n/a - Personally Reviewed  Radiology    Dg Chest  2 View  Result Date: 11/16/2016 IMPRESSION: Cardiomegaly. Aortic atherosclerosis. 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 Electronically Signed   By: Paulina FusiMark  Shogry M.D.   On: 11/16/2016 11:05    Cardiac Studies   n/a  Patient Profile     51 y.o. male with history of poorly controlled hypertension for at least 20 years with hypertensive heart disease, medication noncompliance, chronic diastolic heart failure, CKD stage III, and ongoing tobacco abuse who was [facility] of chest pain and SOB.  Assessment & Plan    1. Elevated troponin/chest pain: -Recent normal nuc in 05/2016 -Will not pursue inpatient ischemic evaluation at this time given the atypical nature of his pain and in par to due to his noncompliance Suspect some of his symptoms are secondary to poorly controlled sleep apnea -ASA -Lopressor  2. Malignant hypertension: -Likely 2/2 medication and dietary noncompliance and poor controlled sleep apnea Blood pressure currently stable on his regimen started in the hospital No further changes needed  3. Acute on chronic diastolic CHF: Would discharge on torsemide 20 twice a day Stressed importance of compliance Needs point controlled sleep apnea addressed as an outpatient He is signing up for Medicaid Continue isosorbide twice a day  4. CKD stage III: -Stable  5. Noncompliance: -Likely the biggest contributing factor to the above -Compliance is advised -Consider paramedicine follow up   Total encounter time more than 25 minutes  Greater than 50% was spent in counseling and coordination of care with the patient    Signed: Dossie Arbourim Mikhai Bienvenue  M.D., Ph.D. [facility]

## 2016-11-18 NOTE — Telephone Encounter (Signed)
TCM.... Patient is being discharged later today °They saw Dr End in hospital  °They are coming on 12/07/16 to see Chris Berge ° °Please call  °

## 2016-11-18 NOTE — Telephone Encounter (Signed)
Patient currently admitted at this time. 

## 2016-11-23 ENCOUNTER — Encounter: Payer: Self-pay | Admitting: Family

## 2016-11-23 ENCOUNTER — Ambulatory Visit: Payer: Medicaid Other | Attending: Family | Admitting: Family

## 2016-11-23 VITALS — BP 149/91 | HR 76 | Resp 20 | Ht 72.0 in | Wt 272.4 lb

## 2016-11-23 DIAGNOSIS — I5032 Chronic diastolic (congestive) heart failure: Secondary | ICD-10-CM

## 2016-11-23 DIAGNOSIS — G8929 Other chronic pain: Secondary | ICD-10-CM | POA: Insufficient documentation

## 2016-11-23 DIAGNOSIS — Z87891 Personal history of nicotine dependence: Secondary | ICD-10-CM | POA: Insufficient documentation

## 2016-11-23 DIAGNOSIS — Z8249 Family history of ischemic heart disease and other diseases of the circulatory system: Secondary | ICD-10-CM | POA: Insufficient documentation

## 2016-11-23 DIAGNOSIS — Z9049 Acquired absence of other specified parts of digestive tract: Secondary | ICD-10-CM | POA: Insufficient documentation

## 2016-11-23 DIAGNOSIS — I10 Essential (primary) hypertension: Secondary | ICD-10-CM

## 2016-11-23 DIAGNOSIS — I5033 Acute on chronic diastolic (congestive) heart failure: Secondary | ICD-10-CM | POA: Insufficient documentation

## 2016-11-23 DIAGNOSIS — Z7982 Long term (current) use of aspirin: Secondary | ICD-10-CM | POA: Insufficient documentation

## 2016-11-23 DIAGNOSIS — M79604 Pain in right leg: Secondary | ICD-10-CM | POA: Diagnosis not present

## 2016-11-23 DIAGNOSIS — Z885 Allergy status to narcotic agent status: Secondary | ICD-10-CM | POA: Insufficient documentation

## 2016-11-23 DIAGNOSIS — N183 Chronic kidney disease, stage 3 (moderate): Secondary | ICD-10-CM | POA: Diagnosis not present

## 2016-11-23 DIAGNOSIS — Z823 Family history of stroke: Secondary | ICD-10-CM | POA: Insufficient documentation

## 2016-11-23 DIAGNOSIS — I130 Hypertensive heart and chronic kidney disease with heart failure and stage 1 through stage 4 chronic kidney disease, or unspecified chronic kidney disease: Secondary | ICD-10-CM | POA: Diagnosis not present

## 2016-11-23 DIAGNOSIS — Z9889 Other specified postprocedural states: Secondary | ICD-10-CM | POA: Insufficient documentation

## 2016-11-23 DIAGNOSIS — M79605 Pain in left leg: Secondary | ICD-10-CM | POA: Diagnosis not present

## 2016-11-23 NOTE — Patient Instructions (Addendum)
Continue weighing daily and call for an overnight weight gain of > 2 pounds or a weekly weight gain of >5 pounds.  [facility]   [phone]  [address]  [place] [address]  Hours: Tuesday: 4:15 pm - 7:30 pm  Wednesday: 9 am - 1 pm  Thursday: 1 pm - 7:30 pm  Friday - Monday: CLOSED 

## 2016-11-23 NOTE — Progress Notes (Signed)
Subjective:    Patient ID: Trevor Nunez, male    DOB: November 12, 1965, 51 y.o.   MRN: [id number]  HPI   Trevor Nunez is a 51 y/o male with a history of HTN, CKD (stage III), previous tobacco use and chronic heart failure.  Reviewed echo report done 11/17/16 which showed an EF of 60-65% along with mild AR. Previous echo was done 06/15/16 and showed an EF of 65-70% along with trivial AR and mildly increased right ventricle wall thickness. This is unchanged from previous one back in July 2017.  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 MRA of aorta ordered but patient refused to lay flat in MRI. NTG drip needed initially and then medications were re-introduced. Discharged home after 2 days. Was in the ED 09/21/16 due to pneumonia. Treated and released. Admitted 06/14/16 with HTN and HF exacerbation along with chest pain. Initially treated with IV diuretics. Had an elevated troponin thought to be due to demand ischemia. Had a stress test which was read as low risk by cardiology. Discharged home. Admitted 10/17/15 with HTN and HF exacerbation along with chest pain. Elevated troponin thought to be due to demand ischemia. Cardiology consult done. Discharged home.   Trevor Nunez presents today for a follow-up visit with a chief complaint of continued moderate fatigue with little exertion. Trevor Nunez describes this as having been present for years with varying levels of severity. Trevor Nunez has associated shortness of breath, edema (although much improved), palpitations, dizziness, HA and difficulty sleeping. Does have chronic pain in both of his legs.  Past Medical History:  Diagnosis Date  . Chronic diastolic CHF (congestive heart failure) (HCC)   . CKD (chronic kidney disease), stage III   . H/O medication noncompliance   . Hypertension    Past Surgical History:  Procedure Laterality Date  . CHOLECYSTECTOMY    . HERNIA REPAIR     Family History  Problem Relation Age of Onset  . Hypertension Mother   . Heart attack Mother   . CVA  Mother   . Heart disease Sister    Social History  Substance Use Topics  . Smoking status: Former Smoker    Quit date: 08/17/2015  . Smokeless tobacco: Never Used  . Alcohol use No    Allergies  Allergen Reactions  . Codeine Palpitations   Prior to Admission medications   Medication Sig Start Date End Date Taking? Authorizing Provider  aspirin 81 MG chewable tablet Chew 1 tablet (81 mg total) by mouth daily. 11/18/16  Yes Enid BaasKalisetti, Radhika, MD  isosorbide mononitrate (IMDUR) 30 MG 24 hr tablet Take 1 tablet (30 mg total) by mouth 2 (two) times daily. 11/18/16  Yes Enid BaasKalisetti, Radhika, MD  lisinopril (PRINIVIL,ZESTRIL) 20 MG tablet Take 1 tablet (20 mg total) by mouth daily. 11/18/16  Yes Enid BaasKalisetti, Radhika, MD  metoprolol tartrate (LOPRESSOR) 50 MG tablet Take 1 tablet (50 mg total) by mouth 2 (two) times daily. 11/18/16  Yes Enid BaasKalisetti, Radhika, MD  nitroGLYCERIN (NITROSTAT) 0.4 MG SL tablet Place 1 tablet (0.4 mg total) under the tongue every 5 (five) minutes as needed for chest pain. 11/18/16  Yes Enid BaasKalisetti, Radhika, MD  potassium chloride SA (K-DUR,KLOR-CON) 20 MEQ tablet Take 1 tablet (20 mEq total) by mouth daily. While taking metolazone 11/17/16 02/15/17 Yes Enid BaasKalisetti, Radhika, MD  torsemide (DEMADEX) 20 MG tablet Take 1 tablet (20 mg total) by mouth 2 (two) times daily. 11/18/16  Yes Enid BaasKalisetti, Radhika, MD    Review of Systems  Constitutional: Positive for fatigue. Negative for  appetite change.  HENT: Negative for congestion, postnasal drip and sore throat.   Eyes: Negative.   Respiratory: Positive for shortness of breath. Negative for cough and chest tightness.   Cardiovascular: Positive for palpitations and leg swelling (better). Negative for chest pain.  Gastrointestinal: Negative for abdominal distention and abdominal pain.  Endocrine: Negative.   Genitourinary: Negative.   Musculoskeletal: Positive for arthralgias (both knees). Negative for back pain.  Skin: Negative.    Allergic/Immunologic: Negative.   Neurological: Positive for dizziness (sometimes) and headaches. Negative for light-headedness.  Hematological: Negative for adenopathy. Does not bruise/bleed easily.  Psychiatric/Behavioral: Positive for sleep disturbance (not sleeping well ). Negative for dysphoric mood and suicidal ideas. The patient is not nervous/anxious.    Vitals:   11/23/16 1153  BP: (!) 149/91  Pulse: 76  Resp: 20  SpO2: 100%  Weight: 272 lb 6 oz (123.5 kg)  Height: 6' (1.829 m)   Wt Readings from Last 3 Encounters:  11/23/16 272 lb 6 oz (123.5 kg)  11/18/16 265 lb 3.2 oz (120.3 kg)  11/16/16 274 lb 8 oz (124.5 kg)    Lab Results  Component Value Date   CREATININE 1.85 (H) 11/17/2016   CREATININE 1.79 (H) 11/16/2016   CREATININE 1.80 (H) 11/16/2016      Objective:   Physical Exam  Constitutional: Trevor Nunez is oriented to person, place, and time. Trevor Nunez appears well-developed and well-nourished.  HENT:  Head: Normocephalic and atraumatic.  Neck: Normal range of motion. Neck supple.  Cardiovascular: Normal rate and regular rhythm.   Pulmonary/Chest: Effort normal. Trevor Nunez has no wheezes. Trevor Nunez has no rales.  Abdominal: Soft. Trevor Nunez exhibits no distension. There is no tenderness.  Musculoskeletal: Trevor Nunez exhibits edema (1+ pitting edema in bilateral lower legs).  Neurological: Trevor Nunez is alert and oriented to person, place, and time.  Skin: Skin is warm and dry.  Psychiatric: Trevor Nunez has a normal mood and affect. His behavior is normal. Thought content normal.  Nursing note and vitals reviewed.     Assessment & Plan:   1: Acute on chronic heart failure with preserved ejection fraction- - NYHA class III - mildly fluid overloaded today - weighing daily and Trevor Nunez was reminded to call for an overnight weight gain of >2 pounds or a weekly weight gain of >5 pounds; weight down 2 pounds since last here although up 7 pounds from hospital discharge - not adding salt; encouraged to read food labels.  -  instructed him to elevate his legs as much as possible; no longer working and is trying to get disability/medicaid - information given, again, on [facility] and charity care program  2: HTN- - BP much improved - says that Trevor Nunez's taking all his medications as directed and says that Trevor Nunez hasn't missed any doses  Having chronic pain issues in his legs and Trevor Nunez was advised that Trevor Nunez would have to get established with [facility] so that Trevor Nunez could have this addressed.   Patient did not bring his medications nor a list. Each medication was verbally reviewed with the patient and Trevor Nunez was encouraged to bring the bottles to every visit to confirm accuracy of list.

## 2016-11-25 ENCOUNTER — Ambulatory Visit: Payer: Self-pay | Admitting: Family

## 2016-12-07 ENCOUNTER — Encounter: Payer: Self-pay | Admitting: Nurse Practitioner

## 2016-12-07 ENCOUNTER — Ambulatory Visit (INDEPENDENT_AMBULATORY_CARE_PROVIDER_SITE_OTHER): Payer: Self-pay | Admitting: Nurse Practitioner

## 2016-12-07 VITALS — BP 174/130 | HR 68 | Ht 72.0 in | Wt 281.8 lb

## 2016-12-07 DIAGNOSIS — N183 Chronic kidney disease, stage 3 unspecified: Secondary | ICD-10-CM

## 2016-12-07 DIAGNOSIS — I5033 Acute on chronic diastolic (congestive) heart failure: Secondary | ICD-10-CM

## 2016-12-07 DIAGNOSIS — I110 Hypertensive heart disease with heart failure: Secondary | ICD-10-CM

## 2016-12-07 MED ORDER — HYDRALAZINE HCL 25 MG PO TABS: 25.0000 mg | ORAL_TABLET | Freq: Three times a day (TID) | ORAL | 3 refills | Status: DC

## 2016-12-07 MED ORDER — CARVEDILOL 12.5 MG PO TABS: 12.5000 mg | ORAL_TABLET | Freq: Two times a day (BID) | ORAL | 3 refills | Status: DC

## 2016-12-07 MED ORDER — POTASSIUM CHLORIDE CRYS ER 20 MEQ PO TBCR: 20.0000 meq | EXTENDED_RELEASE_TABLET | Freq: Every day | ORAL | 3 refills | Status: DC

## 2016-12-07 MED ORDER — TORSEMIDE 20 MG PO TABS: 40.0000 mg | ORAL_TABLET | Freq: Two times a day (BID) | ORAL | 0 refills | Status: DC

## 2016-12-07 NOTE — Progress Notes (Signed)
Office Visit    Patient Name: Trevor Nunez Date of Encounter: 12/07/2016  Primary Care Provider:  Patient, No Pcp Per Primary Cardiologist:  Concha Se, MD   Chief Complaint    51 year old male with a history of chronic diastolic CHF, hypertension, noncompliance, chest pain  Stage III chronic kidney disease, and dilated aortic root with trivial aortic insufficiency who presents for follow-up after hospitalization for hypertensive urgency.  Past Medical History    Past Medical History:  Diagnosis Date  . Chest pain    a. In setting of HTN Urgency w/ trop elevation-->05/2016 MV: no ischemia-->low risk.  . Chronic diastolic CHF (congestive heart failure) (HCC)    a. 10/2015 Echo: EF 55-60%, no rwma; b. 05/2016 Echo: EF 65-70%, sev LVH, Gr2 DD, triv AI, mildly dil LA, nl RV size/fxn, mildly-mod dil RA.  . CKD (chronic kidney disease), stage III   . Dilated aortic root (HCC)    a. 10/2015 Echo: Ao root 25mm;  b. 05/2016 Echo: triv AI.  Marland Kitchen Elevated troponin    a. 10/2015, 05/2016, & 10/2016 in the setting of HTN Urgency;  b. 05/2016 MV: no ischemia-->Low risk.  . H/O medication noncompliance   . Hypertension    Past Surgical History:  Procedure Laterality Date  . CHOLECYSTECTOMY    . HERNIA REPAIR      Allergies  Allergies  Allergen Reactions  . Codeine Palpitations    History of Present Illness    51 year old male with the above complex past medical history including HFpEF, stage III chronic kidney disease, noncompliance, uncontrolled hypertension, obesity, dilated aortic root with trivial aortic insufficiency, and chest pain with elevated troponins in the setting of admissions for hypertensive urgency.  He was previously evaluated in July 2017 and again in February 2018 secondary to chest pain, elevated troponins, and malignant hypertension. Echocardiogram in February 2018 showed normal LV function with grade 2 diastolic dysfunction and severe LVH. Stress testing at that time showed  no ischemia. He was most recently admitted on July 31 with dyspnea, volume overload, and marked hypertension.  He was diuresed and medication adjustments were made. Troponin was mildly elevated, though this was felt to be secondary to demand ischemia in the setting of hypertensive urgency. With improved blood pressure control and systolics in the 140s to 150s, he was discharged home. Since discharge, he says he has been compliant with medications. He is avoiding salt and does not eat out or bring fast food home. He is not weighing himself daily. He does note abdominal bloating or lower extremity edema.  He is also had dyspnea on exertion and orthopnea. He believes his isosorbide is causing headaches. He denies palpitations, PND, dizziness, syncope, or early satiety. His weight is up 11 pounds since discharge.  Home Medications    Prior to Admission medications   Medication Sig Start Date End Date Taking? Authorizing Provider  aspirin 81 MG chewable tablet Chew 1 tablet (81 mg total) by mouth daily. 11/18/16  Yes Enid Baas, MD  isosorbide mononitrate (IMDUR) 30 MG 24 hr tablet Take 1 tablet (30 mg total) by mouth 2 (two) times daily. 11/18/16  Yes Enid Baas, MD  lisinopril (PRINIVIL,ZESTRIL) 20 MG tablet Take 1 tablet (20 mg total) by mouth daily. 11/18/16  Yes Enid Baas, MD  metoprolol tartrate (LOPRESSOR) 50 MG tablet Take 1 tablet (50 mg total) by mouth 2 (two) times daily. 11/18/16  Yes Enid Baas, MD  nitroGLYCERIN (NITROSTAT) 0.4 MG SL tablet Place 1 tablet (0.4  mg total) under the tongue every 5 (five) minutes as needed for chest pain. 11/18/16  Yes Enid Baas, MD  potassium chloride SA (K-DUR,KLOR-CON) 20 MEQ tablet Take 1 tablet (20 mEq total) by mouth daily. While taking metolazone 11/17/16 02/15/17 Yes Enid Baas, MD  torsemide (DEMADEX) 20 MG tablet Take 1 tablet (20 mg total) by mouth 2 (two) times daily. 11/18/16  Yes Enid Baas, MD    Review  of Systems    Notable for 11 pound weight gain, dyspnea exertion, orthopnea, lower extremity edema, increasing abdominal girth. He denies chest pain, palpitations, PND, dizziness, syncope, or early satiety. He has had headaches in the setting of isosorbide therapy and would like to discontinue this.  All other systems reviewed and are otherwise negative except as noted above.  Physical Exam    VS:  BP (!) 174/130 (BP Location: Right Arm, Patient Position: Sitting, Cuff Size: Normal)   Pulse 68   Ht 6' (1.829 m)   Wt 281 lb 12 oz (127.8 kg)   BMI 38.21 kg/m  , BMI Body mass index is 38.21 kg/m. GEN: obese, in no acute distress.  HEENT: normal.  Neck: Supple, obese, difficult to gauge JVP. No carotid bruits, or masses. Cardiac: irregular, frequent ectopy, no murmurs, rubs, or gallops. No clubbing, cyanosis, 2+ bilateral lower extremity edema.  Radials/DP/PT 2+ and equal bilaterally.  Respiratory:  Respirations regular and unlabored, clear to auscultation bilaterally. GI: Semi-firm and protuberant, nondistended, BS + x 4. MS: no deformity or atrophy. Skin: warm and dry, no rash. Neuro:  Strength and sensation are intact. Psych: Normal affect.  Accessory Clinical Findings    ECG - egular sinus rhythm, 68, frequent PVCs, left axis deviation, left anterior fascicular block, LVH with lateral T-wave inversion. Prolonged QT.  No acute changes.  Assessment & Plan    1.  Acute on chronic diastolic congestive heart failure:  EF 65-70% with severe LVH and grade 2 diastolic dysfunction by echo in February 2018. He's had multiple admissions over the years with hypertensive urgency, elevated troponin, chest pain, and heart failure.  He was recently admitted in late July for the same. Following diuresis and adjustment of blood pressure medications, he was able to be discharged. He has been compliant with medications and says he is avoiding salt.  Unfortunately, his blood pressures are elevated today. He  says this is typical for him. He has a lot of stress in his life right now. In the setting of elevated blood pressures, his weight is up 11 pounds and he has had dyspnea on exertion, orthopnea,increasing abdominal girth, lower extremity edema.  He is stable at rest.  Pressure is 172/128. He has been having headaches with isosorbide and therefore I will discontinue this. I'm going to add hydralazine 25mg  3 times a day and switch out his metoprolol for carvedilol 12.5 mg twice a day. He will take his fist dose of hydralazine as soon as he returns home.  In the setting of volume overload, I am increasing his torsemide to 40 mg twice a day and also adding potassium chloride 20 mEq daily. I'm checking a basic metabolic panel today and will plan to see him back in clinic within the next week to reassess clinical progress and lab work. We discussed the importance of compliance, symptom reporting, and reporting changes in weight, with a low threshold to present to the emergency department for any worsening of symptoms.  He verbalized understanding.  2. Hypertensive heart disease with heart failure: See  changes above.  3. Stage III chronic kidney disease: Follow-up basic metabolic panel today and at follow-up within the next week.  4.  Disposition: Medication changes and lab work as above. I will see him back within the next week or sooner if necessary.  Nicolasa Ducking, NP 12/07/2016, 8:44 AM

## 2016-12-07 NOTE — Patient Instructions (Addendum)
Medication Instructions:  Your physician has recommended you make the following change in your medication:  1. STOP Isosorbide mononitrate 2. STOP Metoprolol 3. START Carvedilol 12.5 mg Twice a day 4. START Hydralazine 25 mg Three times a day 5. START Potassium 20 mEq Once a day 6. INCREASE Torsemide 20 mg to 40 mg (2 tablets) Twice a day   Labwork: We will call you with your lab results.    Follow-Up: Your physician recommends that you schedule a follow-up appointment in: 1 week with Ward Givens NP  Please keep a log of your daily weights and blood pressure readings. Bring this information to your upcoming appointment.   It was a pleasure seeing you today here in the office. Please do not hesitate to give [place] a call back if you have any further questions. [phone]  [place] Cellar RN, BSN

## 2016-12-08 LAB — BASIC METABOLIC PANEL
BUN / CREAT RATIO: 11 (ref 9–20)
BUN: 20 mg/dL (ref 6–24)
CO2: 25 mmol/L (ref 20–29)
CREATININE: 1.86 mg/dL — AB (ref 0.76–1.27)
Calcium: 9.2 mg/dL (ref 8.7–10.2)
Chloride: 100 mmol/L (ref 96–106)
GFR calc Af Amer: 47 mL/min/{1.73_m2} — ABNORMAL LOW (ref >59)
GFR calc non Af Amer: 40 mL/min/{1.73_m2} — ABNORMAL LOW (ref >59)
Glucose: 94 mg/dL (ref 65–99)
Potassium: 4.6 mmol/L (ref 3.5–5.2)
SODIUM: 142 mmol/L (ref 134–144)

## 2016-12-14 ENCOUNTER — Encounter: Payer: Self-pay | Admitting: Nurse Practitioner

## 2016-12-14 ENCOUNTER — Other Ambulatory Visit
Admission: RE | Admit: 2016-12-14 | Discharge: 2016-12-14 | Disposition: A | Discharge status: Home / Self Care | Payer: Medicaid Other | Source: Ambulatory Visit | Attending: Nurse Practitioner | Admitting: Nurse Practitioner

## 2016-12-14 ENCOUNTER — Ambulatory Visit (INDEPENDENT_AMBULATORY_CARE_PROVIDER_SITE_OTHER): Payer: Self-pay | Admitting: Nurse Practitioner

## 2016-12-14 VITALS — BP 144/80 | HR 73 | Ht 72.0 in | Wt 281.5 lb

## 2016-12-14 DIAGNOSIS — I110 Hypertensive heart disease with heart failure: Secondary | ICD-10-CM

## 2016-12-14 DIAGNOSIS — M79674 Pain in right toe(s): Secondary | ICD-10-CM

## 2016-12-14 DIAGNOSIS — N183 Chronic kidney disease, stage 3 unspecified: Secondary | ICD-10-CM

## 2016-12-14 DIAGNOSIS — I5033 Acute on chronic diastolic (congestive) heart failure: Secondary | ICD-10-CM | POA: Diagnosis not present

## 2016-12-14 LAB — BASIC METABOLIC PANEL
ANION GAP: 10 (ref 5–15)
BUN: 37 mg/dL — AB (ref 6–20)
CHLORIDE: 103 mmol/L (ref 101–111)
CO2: 26 mmol/L (ref 22–32)
Calcium: 9.7 mg/dL (ref 8.9–10.3)
Creatinine, Ser: 2.28 mg/dL — ABNORMAL HIGH (ref 0.61–1.24)
GFR calc Af Amer: 36 mL/min — ABNORMAL LOW (ref >60)
GFR, EST NON AFRICAN AMERICAN: 31 mL/min — AB (ref >60)
Glucose, Bld: 93 mg/dL (ref 65–99)
POTASSIUM: 3.7 mmol/L (ref 3.5–5.1)
Sodium: 139 mmol/L (ref 135–145)

## 2016-12-14 LAB — URIC ACID: Uric Acid, Serum: 13.4 mg/dL — ABNORMAL HIGH (ref 4.4–7.6)

## 2016-12-14 MED ORDER — TORSEMIDE 20 MG PO TABS: 60.0000 mg | ORAL_TABLET | Freq: Two times a day (BID) | ORAL | 2 refills | Status: DC

## 2016-12-14 NOTE — Patient Instructions (Signed)
Medication Instructions:  Your physician has recommended you make the following change in your medication:  1. INCREASE Torsemide 20 mg to 3 tablets (60 mg) Twice a day   Labwork: Your physician recommends that you go to [facility] Entrance of the hospital to have labs done. Check in at front desk and they will direct you where to go.   Follow-Up: Your physician recommends that you schedule a follow-up appointment in: in 1 week with Ward Givens NP on Wednesday November 21, 2016 at 12:00 PM.  It was a pleasure seeing you today here in the office. Please do not hesitate to give [place] a call back if you have any further questions. [phone]  [place] Cellar RN, BSN

## 2016-12-14 NOTE — Progress Notes (Signed)
Office Visit    Patient Name: Trevor Nunez Date of Encounter: 12/14/2016  Primary Care Provider:  Patient, No Pcp Per Primary Cardiologist:  Concha Se, MD   Chief Complaint    51 year old ? 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  Past Medical History    Past Medical History:  Diagnosis Date  . Chest pain    a. In setting of HTN Urgency w/ trop elevation-->05/2016 MV: no ischemia-->low risk.  . Chronic diastolic CHF (congestive heart failure) (HCC)    a. 10/2015 Echo: EF 55-60%, no rwma; b. 05/2016 Echo: EF 65-70%, sev LVH, Gr2 DD, triv AI, mildly dil LA, nl RV size/fxn, mildly-mod dil RA.  . CKD (chronic kidney disease), stage III   . Dilated aortic root (HCC)    a. 10/2015 Echo: Ao root 52mm;  b. 05/2016 Echo: triv AI.  Marland Kitchen Elevated troponin    a. 10/2015, 05/2016, & 10/2016 in the setting of HTN Urgency;  b. 05/2016 MV: no ischemia-->Low risk.  . H/O medication noncompliance   . Hypertension    Past Surgical History:  Procedure Laterality Date  . CHOLECYSTECTOMY    . HERNIA REPAIR      Allergies  Allergies  Allergen Reactions  . Codeine Palpitations    History of Present Illness    51 y/o ? 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 He has previously had admissions in July 2017, February 2018, and most recently July 2018 with dyspnea, volume overload, and hypertension. In each of those settings, troponin was mildly elevated in the setting of presumed demand ischemia. Stress testing was undertaken in February 2018 and did  not show ischemia. His last echocardiogram was performed in February 2018 showed normal LV function with severe LVH and grade 2 diastolic dysfunction. I saw him August 21 for a post hospital follow-up and he was volume overloaded and hypertensive. At that time, I switched him from metoprolol to carvedilol, added hydralazine, discontinued isosorbide in the setting of headaches, and increased his torsemide dose. Renal function was stable.  Since his last visit, he says his urine output has increased. His weight really hasn't changed. He says he has been compliant with medications and also continues to avoid salt. He doesn't think he is overdoing it with his fluid intake. He continues to have dyspnea on exertion, lower extremity swelling, increase in abdominal girth, orthopnea, and has developed pain in his knees and also right foot/great toe. He does not have a history of gout and doesn't think this represents gout pain. He denies chest pain, palpitations, PND, dizziness, syncope, or early satiety.  Home Medications    Prior to Admission medications   Medication Sig Start Date End Date Taking? Authorizing Provider  aspirin 81 MG chewable tablet Chew 1 tablet (81 mg total) by mouth daily. 11/18/16  Yes Enid Baas, MD  carvedilol (COREG) 12.5 MG tablet Take 1 tablet (12.5 mg total) by mouth 2 (two) times daily. 12/07/16 03/07/17 Yes Ok Anis, NP  hydrALAZINE (APRESOLINE) 25 MG tablet Take 1 tablet (25 mg total) by mouth 3 (three) times daily. 12/07/16 03/07/17 Yes Ok Anis, NP  lisinopril (PRINIVIL,ZESTRIL) 20 MG tablet Take 1 tablet (20 mg total) by mouth daily. 11/18/16  Yes Enid Baas, MD  nitroGLYCERIN (NITROSTAT) 0.4 MG SL tablet Place 1 tablet (0.4 mg total) under the tongue every 5 (five) minutes as needed for chest pain. 11/18/16  Yes Enid Baas, MD  potassium chloride SA (K-DUR,KLOR-CON) 20 MEQ tablet Take 1 tablet (20 mEq total) by mouth daily. 12/07/16  Yes  Ok Anis, NP  torsemide (DEMADEX) 20 MG tablet Take 3 tablets (60 mg total) by mouth 2 (two) times daily. 12/14/16 03/14/17 Yes Ok Anis, NP    Review of Systems   As above, he continues to have dyspnea on exertion, orthopnea, lower extremity edema, and increased abdominal girth. He has also noted bilateral knee and right foot/great toe pain. He denies chest pain, palpitations, PND, dizziness, syncope, or early satiety.  All other systems reviewed and are otherwise negative except as noted above.  Physical Exam    VS:  BP (!) 144/80 (BP Location: Right Arm, Patient Position: Sitting, Cuff Size: Normal)   Pulse 73   Ht 6' (1.829 m)   Wt 281 lb 8 oz (127.7 kg)   SpO2 96%   BMI 38.18 kg/m  , BMI Body mass index is 38.18 kg/m. YNW:GNFAO, in no acute distress.  HEENT: normal.  Neck: Supple, obese, difficult to gauge JVP, no carotid bruits, or masses. Cardiac: RRR, no murmurs, rubs, or gallops. No clubbing, cyanosis, 2+ bilat LE edema.  Radials/DP/PT 2+ and equal bilaterally.  Respiratory:  Respirations regular and unlabored, clear to auscultation bilaterally. GI: Soft, nontender, nondistended, BS + x 4. MS: no deformity or atrophy. The right toe is tender but not red or swollen. Skin: warm and dry, no rash. Neuro:  Strength and sensation are intact. Psych: Normal affect.  Accessory Clinical Findings   BMET/Uric acid pending   Lab Results  Component Value Date   CREATININE 1.86 (H) 12/07/2016   BUN 20 12/07/2016   NA 142 12/07/2016   K 4.6 12/07/2016   CL 100 12/07/2016   CO2 25 12/07/2016    Assessment & Plan    1.  Acute on chronic diastolic congestive heart failure: EF 65-70% severe LVH and grade 2 diastolic dysfunction by echo and febrile 2018. He was most recently admitted in late July. I saw him August 21, and he was volume overloaded and hypertensive. At that point, I changed his metoprolol to carvedilol, added hydralazine, discontinued isosorbide  in the setting of headaches, and increased his torsemide to 40 mg twice a day. Renal function was stable at that time.  He reports compliance with medications and avoidance of salt. Despite this, his weight has not changed. He remains volume overloaded and symptomatic with dyspnea and orthopnea. I will follow-up a basic metabolic panel today as well as a uric acid level, as he is having some right toe and knee pain.  We discussed the importance of daily weights, sodium restriction, medication compliance, and symptom reporting and he verbalizes understanding.  If renal function returns worsened, I would have a low threshold to recommend admission for intravenous diuresis and closer monitoring. Otherwise, plan to follow-up in one week.  2. Hypertensive heart disease with heart failure: Blood pressure is much improved this week at 144/80. He does not check it at home. I'm increasing torsemide as outlined above. Continue carvedilol, lisinopril, and hydralazine.   3. Stage III chronic kidney disease: Renal function stable last week. Follow up again today.   4.  Right toe and bilateral knee pain: He says the knee pain is chronic but her pain is new. I have concern that this might  represent gout. Follow-up uric acid today.   5. Disposition: This metabolic panel and uric acid level today. I will see him back in one week.   Nicolasa Ducking, NP 12/14/2016, 4:36 PM

## 2016-12-16 ENCOUNTER — Other Ambulatory Visit: Payer: Self-pay

## 2016-12-16 DIAGNOSIS — I5032 Chronic diastolic (congestive) heart failure: Secondary | ICD-10-CM

## 2016-12-16 MED ORDER — COLCHICINE 0.6 MG PO TABS: 0.6000 mg | ORAL_TABLET | Freq: Every day | ORAL | 1 refills | Status: DC

## 2016-12-16 MED ORDER — ALLOPURINOL 100 MG PO TABS: 100.0000 mg | ORAL_TABLET | Freq: Every day | ORAL | 0 refills | Status: DC

## 2016-12-17 ENCOUNTER — Other Ambulatory Visit
Admission: RE | Admit: 2016-12-17 | Discharge: 2016-12-17 | Disposition: A | Discharge status: Home / Self Care | Payer: Medicaid Other | Source: Ambulatory Visit | Attending: Cardiovascular Disease | Admitting: Cardiovascular Disease

## 2016-12-17 DIAGNOSIS — I5032 Chronic diastolic (congestive) heart failure: Secondary | ICD-10-CM | POA: Insufficient documentation

## 2016-12-17 LAB — BASIC METABOLIC PANEL
ANION GAP: 8 (ref 5–15)
BUN: 26 mg/dL — AB (ref 6–20)
CHLORIDE: 103 mmol/L (ref 101–111)
CO2: 27 mmol/L (ref 22–32)
Calcium: 8.9 mg/dL (ref 8.9–10.3)
Creatinine, Ser: 1.83 mg/dL — ABNORMAL HIGH (ref 0.61–1.24)
GFR calc Af Amer: 47 mL/min — ABNORMAL LOW (ref >60)
GFR, EST NON AFRICAN AMERICAN: 40 mL/min — AB (ref >60)
GLUCOSE: 115 mg/dL — AB (ref 65–99)
Potassium: 3.5 mmol/L (ref 3.5–5.1)
Sodium: 138 mmol/L (ref 135–145)

## 2016-12-22 ENCOUNTER — Encounter: Payer: Self-pay | Admitting: Family

## 2016-12-22 ENCOUNTER — Ambulatory Visit: Payer: Medicaid Other | Attending: Family | Admitting: Family

## 2016-12-22 ENCOUNTER — Encounter: Payer: Self-pay | Admitting: Nurse Practitioner

## 2016-12-22 ENCOUNTER — Ambulatory Visit (INDEPENDENT_AMBULATORY_CARE_PROVIDER_SITE_OTHER): Payer: Self-pay | Admitting: Nurse Practitioner

## 2016-12-22 VITALS — BP 140/100 | HR 64 | Ht 72.0 in | Wt 276.5 lb

## 2016-12-22 VITALS — BP 140/90 | HR 63 | Resp 20 | Ht 72.0 in | Wt 277.4 lb

## 2016-12-22 DIAGNOSIS — R42 Dizziness and giddiness: Secondary | ICD-10-CM | POA: Diagnosis not present

## 2016-12-22 DIAGNOSIS — R0789 Other chest pain: Secondary | ICD-10-CM

## 2016-12-22 DIAGNOSIS — Z87891 Personal history of nicotine dependence: Secondary | ICD-10-CM | POA: Insufficient documentation

## 2016-12-22 DIAGNOSIS — R0602 Shortness of breath: Secondary | ICD-10-CM | POA: Insufficient documentation

## 2016-12-22 DIAGNOSIS — I5032 Chronic diastolic (congestive) heart failure: Secondary | ICD-10-CM | POA: Diagnosis not present

## 2016-12-22 DIAGNOSIS — Z7982 Long term (current) use of aspirin: Secondary | ICD-10-CM | POA: Insufficient documentation

## 2016-12-22 DIAGNOSIS — Z8249 Family history of ischemic heart disease and other diseases of the circulatory system: Secondary | ICD-10-CM | POA: Diagnosis not present

## 2016-12-22 DIAGNOSIS — N183 Chronic kidney disease, stage 3 unspecified: Secondary | ICD-10-CM

## 2016-12-22 DIAGNOSIS — Z9049 Acquired absence of other specified parts of digestive tract: Secondary | ICD-10-CM | POA: Diagnosis not present

## 2016-12-22 DIAGNOSIS — Z9889 Other specified postprocedural states: Secondary | ICD-10-CM | POA: Insufficient documentation

## 2016-12-22 DIAGNOSIS — R079 Chest pain, unspecified: Secondary | ICD-10-CM | POA: Insufficient documentation

## 2016-12-22 DIAGNOSIS — I130 Hypertensive heart and chronic kidney disease with heart failure and stage 1 through stage 4 chronic kidney disease, or unspecified chronic kidney disease: Secondary | ICD-10-CM | POA: Insufficient documentation

## 2016-12-22 DIAGNOSIS — I110 Hypertensive heart disease with heart failure: Secondary | ICD-10-CM

## 2016-12-22 DIAGNOSIS — I5033 Acute on chronic diastolic (congestive) heart failure: Secondary | ICD-10-CM

## 2016-12-22 DIAGNOSIS — Z79899 Other long term (current) drug therapy: Secondary | ICD-10-CM | POA: Insufficient documentation

## 2016-12-22 DIAGNOSIS — Z823 Family history of stroke: Secondary | ICD-10-CM | POA: Diagnosis not present

## 2016-12-22 DIAGNOSIS — R002 Palpitations: Secondary | ICD-10-CM | POA: Insufficient documentation

## 2016-12-22 DIAGNOSIS — Z888 Allergy status to other drugs, medicaments and biological substances status: Secondary | ICD-10-CM | POA: Insufficient documentation

## 2016-12-22 DIAGNOSIS — G8929 Other chronic pain: Secondary | ICD-10-CM | POA: Insufficient documentation

## 2016-12-22 DIAGNOSIS — I10 Essential (primary) hypertension: Secondary | ICD-10-CM

## 2016-12-22 DIAGNOSIS — M10371 Gout due to renal impairment, right ankle and foot: Secondary | ICD-10-CM

## 2016-12-22 MED ORDER — HYDRALAZINE HCL 50 MG PO TABS: 50.0000 mg | ORAL_TABLET | Freq: Three times a day (TID) | ORAL | 3 refills | Status: DC

## 2016-12-22 NOTE — Patient Instructions (Addendum)
Continue weighing daily and call for an overnight weight gain of > 2 pounds or a weekly weight gain of >5 Pounds.  Maintain fluid intake to no more than 60 ounces of fluid daily.

## 2016-12-22 NOTE — Patient Instructions (Signed)
Medication Instructions:  Your physician has recommended you make the following change in your medication:  STOP taking colchicine INCREASE hydralazine to 50mg  three times a day   Labwork: BMET today  Testing/Procedures: none  Follow-Up: Your physician recommends that you schedule a follow-up appointment in: one month with Dr. Mariah MillingGollan or Ward Givenshris Berge, NP   Any Other Special Instructions Will Be Listed Below (If Applicable).     If you need a refill on your cardiac medications before your next appointment, please call your pharmacy.

## 2016-12-22 NOTE — Progress Notes (Signed)
Subjective:    Patient ID: Trevor Nunez, male    DOB: [birth year], 51 y.o.   MRN: [id number]  HPI   Trevor Nunez is a 51 y/o male with a history of HTN, CKD (stage III), previous tobacco use and chronic heart failure.  Reviewed echo report done 11/17/16 which showed an EF of 60-65% along with mild AR. Previous echo was done 06/15/16 and showed an EF of 65-70% along with trivial AR and mildly increased right ventricle wall thickness. This is unchanged from previous one back in July 2017.  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 MRA of aorta ordered but patient refused to lay flat in MRI. NTG drip needed initially and then medications were re-introduced. Discharged home after 2 days. Was in the ED 09/21/16 due to pneumonia. Treated and released. Admitted 06/14/16 with HTN and HF exacerbation along with chest pain. Initially treated with IV diuretics. Had an elevated troponin thought to be due to demand ischemia. Had a stress test which was read as low risk by cardiology. Discharged home. Admitted 10/17/15 with HTN and HF exacerbation along with chest pain. Elevated troponin thought to be due to demand ischemia. Cardiology consult done. Discharged home.   He presents today for a follow-up visit with a chief complaint of mild shortness of breath upon moderate exertion. He describes this as chronic in nature having been present for years with varying levels of severity. He has associated fatigue, edema, palpitations, dizziness and difficulty sleeping along with this. Has also noticed a gradual increase in his weight. Has been having some chest pain over the last 2-3 days as well.  Past Medical History:  Diagnosis Date  . Chest pain    a. In setting of HTN Urgency w/ trop elevation-->05/2016 MV: no ischemia-->low risk.  . Chronic diastolic CHF (congestive heart failure) (HCC)    a. 10/2015 Echo: EF 55-60%, no rwma; b. 05/2016 Echo: EF 65-70%, sev LVH, Gr2 DD, triv AI, mildly dil LA, nl RV size/fxn, mildly-mod dil RA.   . CKD (chronic kidney disease), stage III   . Dilated aortic root (HCC)    a. 10/2015 Echo: Ao root 41mm;  b. 05/2016 Echo: triv AI.  Marland Kitchen Elevated troponin    a. 10/2015, 05/2016, & 10/2016 in the setting of HTN Urgency;  b. 05/2016 MV: no ischemia-->Low risk.  . H/O medication noncompliance   . Hypertension    Past Surgical History:  Procedure Laterality Date  . CHOLECYSTECTOMY    . HERNIA REPAIR     Family History  Problem Relation Age of Onset  . Hypertension Mother   . Heart attack Mother   . CVA Mother   . Heart disease Sister    Social History  Substance Use Topics  . Smoking status: Former Smoker    Quit date: 08/17/2015  . Smokeless tobacco: Never Used  . Alcohol use No    Allergies  Allergen Reactions  . Codeine Palpitations   Prior to Admission medications   Medication Sig Start Date End Date Taking? Authorizing Provider  allopurinol (ZYLOPRIM) 100 MG tablet Take 1 tablet (100 mg total) by mouth daily. 12/16/16  Yes Iran Ouch, MD  aspirin 81 MG chewable tablet Chew 1 tablet (81 mg total) by mouth daily. 11/18/16  Yes Enid Baas, MD  carvedilol (COREG) 12.5 MG tablet Take 1 tablet (12.5 mg total) by mouth 2 (two) times daily. 12/07/16 03/07/17 Yes Ok Anis, NP  colchicine 0.6 MG tablet Take 1 tablet (0.6 mg total)  by mouth daily. 12/16/16  Yes Iran OuchArida, Muhammad A, MD  hydrALAZINE (APRESOLINE) 25 MG tablet Take 1 tablet (25 mg total) by mouth 3 (three) times daily. 12/07/16 03/07/17 Yes Ok AnisBerge, Christopher R, NP  lisinopril (PRINIVIL,ZESTRIL) 20 MG tablet Take 1 tablet (20 mg total) by mouth daily. 11/18/16  Yes Enid BaasKalisetti, Radhika, MD  nitroGLYCERIN (NITROSTAT) 0.4 MG SL tablet Place 1 tablet (0.4 mg total) under the tongue every 5 (five) minutes as needed for chest pain. 11/18/16  Yes Enid BaasKalisetti, Radhika, MD  potassium chloride SA (K-DUR,KLOR-CON) 20 MEQ tablet Take 1 tablet (20 mEq total) by mouth daily. 12/07/16  Yes Ok AnisBerge, Christopher R, NP  torsemide  (DEMADEX) 20 MG tablet Take 3 tablets (60 mg total) by mouth 2 (two) times daily. 12/14/16 03/14/17 Yes Ok AnisBerge, Christopher R, NP     Review of Systems  Constitutional: Positive for fatigue. Negative for appetite change.  HENT: Negative for congestion, postnasal drip and sore throat.   Eyes: Negative.   Respiratory: Positive for shortness of breath. Negative for cough and chest tightness.   Cardiovascular: Positive for chest pain, palpitations and leg swelling (better).  Gastrointestinal: Negative for abdominal distention and abdominal pain.  Endocrine: Negative.   Genitourinary: Negative.   Musculoskeletal: Positive for arthralgias (both knees). Negative for back pain.  Skin: Negative.   Allergic/Immunologic: Negative.   Neurological: Positive for dizziness (sometimes) and headaches. Negative for light-headedness.       Tingling down the left arm   Hematological: Negative for adenopathy. Does not bruise/bleed easily.  Psychiatric/Behavioral: Positive for sleep disturbance (not sleeping well ). Negative for dysphoric mood and suicidal ideas. The patient is not nervous/anxious.    Vitals:   12/22/16 1051  Pulse: 63  Resp: 20  SpO2: 94%  Weight: 277 lb 6 oz (125.8 kg)  Height: 6' (1.829 m)   Wt Readings from Last 3 Encounters:  12/22/16 277 lb 6 oz (125.8 kg)  12/14/16 281 lb 8 oz (127.7 kg)  12/07/16 281 lb 12 oz (127.8 kg)    Lab Results  Component Value Date   CREATININE 1.83 (H) 12/17/2016   CREATININE 2.28 (H) 12/14/2016   CREATININE 1.86 (H) 12/07/2016      Objective:   Physical Exam  Constitutional: He is oriented to person, place, and time. He appears well-developed and well-nourished.  HENT:  Head: Normocephalic and atraumatic.  Neck: Normal range of motion. Neck supple.  Cardiovascular: Normal rate and regular rhythm.   Pulmonary/Chest: Effort normal. He has no wheezes. He has no rales.  Abdominal: Soft. He exhibits no distension. There is no tenderness.   Musculoskeletal: He exhibits edema (trace pitting edema in bilateral lower legs).  Neurological: He is alert and oriented to person, place, and time.  Skin: Skin is warm and dry.  Psychiatric: He has a normal mood and affect. His behavior is normal. Thought content normal.  Nursing note and vitals reviewed.     Assessment & Plan:   1: Chronic heart failure with preserved ejection fraction- - NYHA class II - mildly fluid overloaded today - weighing daily and he was reminded to call for an overnight weight gain of >2 pounds or a weekly weight gain of >5 pounds; weight up 5 pounds since he was last here  - not adding salt; encouraged to read food labels.  - instructed him to elevate his legs as much as possible; no longer working and is trying to get disability/medicaid - has not filled out paperwork for [facility] or charity  care program - saw cardiology Brion Aliment) 12/14/16 & returns 12/22/16 - drinking 32 ounces of water daily along with 48 ounces of juice daily=80 ounces daily; advised him to decrease his fluid consumption down to 60 ounces of fluid daily  2: HTN- - BP elevated initially and then improving upon recheck with a manual cuff (140/90) - says that he's taking all his medications as directed and says that he hasn't missed any doses - BMP done 12/17/16 shows sodium 138, potassium 3.5 and GFR 47  3: Chest pain- - this started 2-3 days ago and is relieved with 1 NTG - reproducible upon palpation which is reassuring - he is seeing cardiology right after this so will defer work-up to them  Having chronic pain issues in his legs and he was again advised that he would have to get established with [facility] so that he could have this addressed.    Patient did not bring his medications nor a list. Each medication was verbally reviewed with the patient and he was encouraged to bring the bottles to every visit to confirm accuracy of list.  Return in 3 months or sooner for any  questions/problems before then.

## 2016-12-22 NOTE — Progress Notes (Signed)
Office Visit    Patient Name: Trevor Nunez Date of Encounter: 12/22/2016  Primary Care Provider:  Antonieta Iba, MD Primary Cardiologist:  Concha Se, MD   Chief Complaint    51 year old ? 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  Past Medical History    Past Medical History:  Diagnosis Date  . Chest pain    a. In setting of HTN Urgency w/ trop elevation-->05/2016 MV: no ischemia-->low risk.  . Chronic diastolic CHF (congestive heart failure) (HCC)    a. 10/2015 Echo: EF 55-60%, no rwma; b. 05/2016 Echo: EF 65-70%, sev LVH, Gr2 DD, triv AI, mildly dil LA, nl RV size/fxn, mildly-mod dil RA.  . CKD (chronic kidney disease), stage III   . Dilated aortic root (HCC)    a. 10/2015 Echo: Ao root 41mm;  b. 05/2016 Echo: triv AI.  Marland Kitchen Elevated troponin    a. 10/2015, 05/2016, & 10/2016 in the setting of HTN Urgency;  b. 05/2016 MV: no ischemia-->Low risk.  . H/O medication noncompliance   . Hypertension    Past Surgical History:  Procedure Laterality Date  . CHOLECYSTECTOMY    . HERNIA REPAIR      Allergies  Allergies  Allergen Reactions  . Codeine Palpitations    History of Present Illness    51 year old ? 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 He has previously had admissions in July 2017, February 2018, and most recently July 2018 with dyspnea, volume overload, and hypertension. In each of those settings, troponin was mildly elevated in the setting of presumed demand ischemia. Stress testing was undertaken in February 2018 and did not  show ischemia. His last echocardiogram was performed in February 2018 showed normal LV function with severe LVH and grade 2 diastolic dysfunction.  I saw him August 21 for post hospital follow-up at which time he is volume overloaded and hypertensive. I adjusted his torsemide dose, switch from metoprolol to carvedilol, and added hydralazine. I saw him back on August 28 and he remained volume overloaded at the same weight-281 pounds. He continued to have increasing abdominal girth, orthopnea, lower extension edema, and dyspnea on exertion. He also complained of right great toe pain at that time. He had mild renal insufficiency on follow-up labs that day. His uric acid was elevated at 13.4. He was prescribed one dose of colchicine 0.6 mg and also allopurinol 100 mg daily. I followed up a basic metabolic panel on August 31 and this showed some improvement in renal function.  Over the past week, he has noted improved diuresis with 5 pound weight loss, improved lower extremity edema, and improvement in abdominal girth. He is less orthopneic. His exercise tolerance is only slightly better, although he notes that deconditioning the setting of bilateral knee pain is playing a role there is well. Since Sunday, he has had left-sided chest/pectoral pain and soreness that is worse with certain position changes and palpation. He denies PND, dizziness, syncope, or early satiety.  Home Medications    Prior to Admission medications   Medication Sig Start Date End Date Taking? Authorizing Provider  allopurinol (ZYLOPRIM) 100 MG tablet Take 1 tablet (  100 mg total) by mouth daily. 12/16/16  Yes Iran Ouch, MD  aspirin 81 MG chewable tablet Chew 1 tablet (81 mg total) by mouth daily. 11/18/16  Yes Enid Baas, MD  carvedilol (COREG) 12.5 MG tablet Take 1 tablet (12.5 mg total) by mouth 2 (two) times daily. 12/07/16 03/07/17 Yes Ok Anis, NP  lisinopril (PRINIVIL,ZESTRIL) 20 MG tablet Take 1 tablet (20  mg total) by mouth daily. 11/18/16  Yes Enid Baas, MD  nitroGLYCERIN (NITROSTAT) 0.4 MG SL tablet Place 1 tablet (0.4 mg total) under the tongue every 5 (five) minutes as needed for chest pain. 11/18/16  Yes Enid Baas, MD  potassium chloride SA (K-DUR,KLOR-CON) 20 MEQ tablet Take 1 tablet (20 mEq total) by mouth daily. 12/07/16  Yes Ok Anis, NP  torsemide (DEMADEX) 20 MG tablet Take 3 tablets (60 mg total) by mouth 2 (two) times daily. 12/14/16 03/14/17 Yes Ok Anis, NP  hydrALAZINE (APRESOLINE) 50 MG tablet Take 1 tablet (50 mg total) by mouth 3 (three) times daily. 12/22/16 03/22/17  Ok Anis, NP    Review of Systems    Breathing, edema, orthopnea, and abdominal bloating has improved some. He has been having left-sided chest tenderness and soreness since Sunday.  All other systems reviewed and are otherwise negative except as noted above.  Physical Exam    VS:  BP (!) 140/100 (BP Location: Left Arm, Patient Position: Sitting, Cuff Size: Large)   Pulse 64   Ht 6' (1.829 m)   Wt 276 lb 8 oz (125.4 kg)   BMI 37.50 kg/m  , BMI Body mass index is 37.5 kg/m. GEN: Well nourished, well developed, in no acute distress.  HEENT: normal.  Neck: Supple, no JVD, carotid bruits, or masses. Cardiac: RRR, no murmurs, rubs, or gallops. No clubbing, cyanosis, Trace bilateral lower extremity edema.  Radials/DP/PT 2+ and equal bilaterally.  L chest wall sore to palpation. Respiratory:  Respirations regular and unlabored, clear to auscultation bilaterally. GI: Soft, nontender, nondistended, BS + x 4. Abdomen is softer than last visit. MS: no deformity or atrophy. Skin: warm and dry, no rash. Neuro:  Strength and sensation are intact. Psych: Normal affect.  Accessory Clinical Findings    ECG - Regular sinus rhythm, 64, left axis deviation, left anterior fascicular block, LVH, lateral T-wave inversion.  Lab Results  Component Value Date   CREATININE 1.83  (H) 12/17/2016   BUN 26 (H) 12/17/2016   NA 138 12/17/2016   K 3.5 12/17/2016   CL 103 12/17/2016   CO2 27 12/17/2016    Lab Results  Component Value Date   LABURIC 13.4 (H) 12/14/2016     Assessment & Plan    1.  Acute on chronic diastolic congestive heart failure: EF 65-70% with severe LVH and grade 2 diastolic dysfunction by echo in February 2018. I have seen him several times in the past few weeks due to volume overload. As last visit, we increased his torsemide to 60 mg twice a day. With this, he has had 5 pound weight loss and improve symptoms. He still has trace lower extremity edema but overall his volume does appear to be better.  He is still not at his previous Nedra Hai consider dry weight of somewhere between 265 and 275. I will plan to continue his current dose of torsemide and follow-up a basic metabolic panel today. His blood pressure is elevated I will increase his hydralazine to 50 mg 3 times a day. Continue carvedilol and  lisinopril. Provided that labs look okay, plan to follow-up in one month.  We discussed the importance of daily weights, sodium restriction, medication compliance, and symptom reporting and he verbalizes understanding.   2. Hypertensive heart disease with heart failure: Blood pressure 140/100 today. Increasing hydralazine to 50 3 times a day.  3. Stage III chronic kidney disease: Renal function stable August 31. Follow-up today given ongoing need for higher dose of torsemide.  4.  Gout, right great toe: Uric acid was elevated at 13.4 and August 28. He is now on allopurinol 100 mg daily. He was supposed to have been prescribed colchicine 0.6 mg 1 however it appears he has been taking it daily. This we discontinued given his renal insufficiency.  Toe pain has resolved completely.  5. MSK chest pain:  Pt has been having left chest wall tenderness and soreness since Sunday.  Easily reproducible with palpation.  Rec prn tylenol.  Avoiding NSAIDS in the setting of CKD.     6.  Disposition: Follow-up basic metabolic panel today. Provided that labs are stable, follow-up in one month.  Nicolasa Duckinghristopher Labrandon Knoch, NP 12/22/2016, 1:04 PM

## 2016-12-23 LAB — BASIC METABOLIC PANEL
BUN/Creatinine Ratio: 10 (ref 9–20)
BUN: 17 mg/dL (ref 6–24)
CO2: 24 mmol/L (ref 20–29)
CREATININE: 1.75 mg/dL — AB (ref 0.76–1.27)
Calcium: 8.9 mg/dL (ref 8.7–10.2)
Chloride: 99 mmol/L (ref 96–106)
GFR, EST AFRICAN AMERICAN: 50 mL/min/{1.73_m2} — AB (ref >59)
GFR, EST NON AFRICAN AMERICAN: 43 mL/min/{1.73_m2} — AB (ref >59)
Glucose: 94 mg/dL (ref 65–99)
Potassium: 4.4 mmol/L (ref 3.5–5.2)
SODIUM: 138 mmol/L (ref 134–144)

## 2017-01-24 ENCOUNTER — Telehealth: Payer: Self-pay | Admitting: Family

## 2017-01-24 ENCOUNTER — Other Ambulatory Visit: Payer: Self-pay | Admitting: Family

## 2017-01-24 MED ORDER — METOLAZONE 2.5 MG PO TABS: 2.5000 mg | ORAL_TABLET | Freq: Every day | ORAL | 0 refills | Status: DC

## 2017-01-24 NOTE — Telephone Encounter (Signed)
Returned patient's call regarding increased weight, edema and shortness of breath. He says that 2 days ago he weighted 288 pounds and today he weighs 292 pounds. Does endorse worsening shortness of breath along with increased edema.  Currently taking torsemide  BID along with potassium daily.   Will add metolazone 2.5mg  daily for the next 2 days. Best to take it 1/2 hour prior to torsemide dose if possible. Also advised him to take an additional potassium daily for these next 2 days as well.   Metolazone RX sent to pharmacy. Will see patient in the office on 01/26/17. Will check labs at that time.

## 2017-01-26 ENCOUNTER — Ambulatory Visit: Payer: Medicaid Other | Attending: Family | Admitting: Family

## 2017-01-26 ENCOUNTER — Encounter: Payer: Self-pay | Admitting: Family

## 2017-01-26 VITALS — BP 190/100 | HR 75 | Resp 20 | Ht 72.0 in | Wt 286.0 lb

## 2017-01-26 DIAGNOSIS — I130 Hypertensive heart and chronic kidney disease with heart failure and stage 1 through stage 4 chronic kidney disease, or unspecified chronic kidney disease: Secondary | ICD-10-CM | POA: Insufficient documentation

## 2017-01-26 DIAGNOSIS — I5032 Chronic diastolic (congestive) heart failure: Secondary | ICD-10-CM | POA: Diagnosis present

## 2017-01-26 DIAGNOSIS — N183 Chronic kidney disease, stage 3 (moderate): Secondary | ICD-10-CM | POA: Insufficient documentation

## 2017-01-26 DIAGNOSIS — I10 Essential (primary) hypertension: Secondary | ICD-10-CM

## 2017-01-26 DIAGNOSIS — R079 Chest pain, unspecified: Secondary | ICD-10-CM | POA: Diagnosis not present

## 2017-01-26 DIAGNOSIS — Z87891 Personal history of nicotine dependence: Secondary | ICD-10-CM | POA: Diagnosis not present

## 2017-01-26 DIAGNOSIS — Z7982 Long term (current) use of aspirin: Secondary | ICD-10-CM | POA: Diagnosis not present

## 2017-01-26 DIAGNOSIS — R0602 Shortness of breath: Secondary | ICD-10-CM | POA: Insufficient documentation

## 2017-01-26 DIAGNOSIS — I77810 Thoracic aortic ectasia: Secondary | ICD-10-CM | POA: Diagnosis not present

## 2017-01-26 DIAGNOSIS — M79604 Pain in right leg: Secondary | ICD-10-CM

## 2017-01-26 DIAGNOSIS — M79669 Pain in unspecified lower leg: Secondary | ICD-10-CM | POA: Insufficient documentation

## 2017-01-26 DIAGNOSIS — M79605 Pain in left leg: Secondary | ICD-10-CM

## 2017-01-26 LAB — BASIC METABOLIC PANEL
Anion gap: 9 (ref 5–15)
BUN: 23 mg/dL — ABNORMAL HIGH (ref 6–20)
CALCIUM: 9.5 mg/dL (ref 8.9–10.3)
CO2: 31 mmol/L (ref 22–32)
CREATININE: 2.05 mg/dL — AB (ref 0.61–1.24)
Chloride: 104 mmol/L (ref 101–111)
GFR, EST AFRICAN AMERICAN: 41 mL/min — AB (ref >60)
GFR, EST NON AFRICAN AMERICAN: 35 mL/min — AB (ref >60)
Glucose, Bld: 95 mg/dL (ref 65–99)
Potassium: 3.4 mmol/L — ABNORMAL LOW (ref 3.5–5.1)
Sodium: 144 mmol/L (ref 135–145)

## 2017-01-26 NOTE — Progress Notes (Signed)
Subjective:    Patient ID: Trevor Nunez, male    DOB: 1966/02/05, 51 y.o.   MRN: [id number]  HPI   Mr Rosiles is a 51 y/o male with a history of HTN, CKD (stage III), previous tobacco use and chronic heart failure.  Reviewed echo report done 11/17/16 which showed an EF of 60-65% along with mild AR. Previous echo was done 06/15/16 and showed an EF of 65-70% along with trivial AR and mildly increased right ventricle wall thickness. This is unchanged from previous one back in July 2017.  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 MRA of aorta ordered but patient refused to lay flat in MRI. NTG drip needed initially and then medications were re-introduced. Discharged home after 2 days. Was in the ED 09/21/16 due to pneumonia. Treated and released. Admitted 06/14/16 with HTN and HF exacerbation along with chest pain. Initially treated with IV diuretics. Had an elevated troponin thought to be due to demand ischemia. Had a stress test which was read as low risk by cardiology. Discharged home. Admitted 10/17/15 with HTN and HF exacerbation along with chest pain. Elevated troponin thought to be due to demand ischemia. Cardiology consult done. Discharged home.   He presents today for a follow-up visit with a chief complaint of minimal shortness of breath upon moderate exertion. He describes this as chronic in nature having been present for several years. He has associated fatigue, intermittent chest pain, edema, palpitations, dizziness, anxiety, difficulty sleeping and weight gain. He denies wheezing. He continues to have chronic lower leg pain. Has recently gotten approved for his medicaid.   Past Medical History:  Diagnosis Date  . Chest pain    a. In setting of HTN Urgency w/ trop elevation-->05/2016 MV: no ischemia-->low risk.  . Chronic diastolic CHF (congestive heart failure) (HCC)    a. 10/2015 Echo: EF 55-60%, no rwma; b. 05/2016 Echo: EF 65-70%, sev LVH, Gr2 DD, triv AI, mildly dil LA, nl RV size/fxn, mildly-mod  dil RA.  . CKD (chronic kidney disease), stage III   . Dilated aortic root (HCC)    a. 10/2015 Echo: Ao root 41mm;  b. 05/2016 Echo: triv AI.  Marland Kitchen Elevated troponin    a. 10/2015, 05/2016, & 10/2016 in the setting of HTN Urgency;  b. 05/2016 MV: no ischemia-->Low risk.  . H/O medication noncompliance   . Hypertension    Past Surgical History:  Procedure Laterality Date  . CHOLECYSTECTOMY    . HERNIA REPAIR     Family History  Problem Relation Age of Onset  . Hypertension Mother   . Heart attack Mother   . CVA Mother   . Heart disease Sister    Social History  Substance Use Topics  . Smoking status: Former Smoker    Quit date: 08/17/2015  . Smokeless tobacco: Never Used  . Alcohol use No    Allergies  Allergen Reactions  . Codeine Palpitations   Prior to Admission medications   Medication Sig Start Date End Date Taking? Authorizing Provider  allopurinol (ZYLOPRIM) 100 MG tablet Take 1 tablet (100 mg total) by mouth daily. 12/16/16  Yes Iran Ouch, MD  carvedilol (COREG) 12.5 MG tablet Take 1 tablet (12.5 mg total) by mouth 2 (two) times daily. 12/07/16 03/07/17 Yes Ok Anis, NP  hydrALAZINE (APRESOLINE) 50 MG tablet Take 1 tablet (50 mg total) by mouth 3 (three) times daily. 12/22/16 03/22/17 Yes Ok Anis, NP  lisinopril (PRINIVIL,ZESTRIL) 20 MG tablet Take 1 tablet (20 mg  total) by mouth daily. 11/18/16  Yes Enid Baas, MD  nitroGLYCERIN (NITROSTAT) 0.4 MG SL tablet Place 1 tablet (0.4 mg total) under the tongue every 5 (five) minutes as needed for chest pain. 11/18/16  Yes Enid Baas, MD  torsemide (DEMADEX) 20 MG tablet Take 3 tablets (60 mg total) by mouth 2 (two) times daily. 12/14/16 03/14/17 Yes Ok Anis, NP  aspirin 81 MG chewable tablet Chew 1 tablet (81 mg total) by mouth daily. Patient not taking: Reported on 01/26/2017 11/18/16   Enid Baas, MD  metolazone (ZAROXOLYN) 2.5 MG tablet Take 1 tablet (2.5 mg total) by  mouth daily. Best to take it 1/2 hour before you take your torsemide 01/24/17 01/26/17  Clarisa Kindred A, FNP  potassium chloride SA (K-DUR,KLOR-CON) 20 MEQ tablet Take 1 tablet (20 mEq total) by mouth 2 (two) times daily. 01/27/17   Delma Freeze, FNP    Review of Systems  Constitutional: Positive for fatigue. Negative for appetite change.  HENT: Negative for congestion, postnasal drip and sore throat.   Eyes: Negative.   Respiratory: Positive for shortness of breath. Negative for cough and chest tightness.   Cardiovascular: Positive for chest pain, palpitations and leg swelling (better).  Gastrointestinal: Negative for abdominal distention and abdominal pain.  Endocrine: Negative.   Genitourinary: Negative.   Musculoskeletal: Positive for arthralgias (both knees). Negative for back pain.  Skin: Negative.   Allergic/Immunologic: Negative.   Neurological: Positive for dizziness (sometimes) and headaches. Negative for light-headedness.           Hematological: Negative for adenopathy. Does not bruise/bleed easily.  Psychiatric/Behavioral: Positive for sleep disturbance (not sleeping well ). Negative for dysphoric mood and suicidal ideas. The patient is nervous/anxious (increasing panic attacks).    Vitals:   01/26/17 1412 01/26/17 1423  BP: (!) 203/114 (!) 190/100  Pulse: 75   Resp: 20   SpO2: 98%   Weight: 286 lb (129.7 kg)   Height: 6' (1.829 m)    Wt Readings from Last 3 Encounters:  01/26/17 286 lb (129.7 kg)  12/22/16 276 lb 8 oz (125.4 kg)  12/22/16 277 lb 6 oz (125.8 kg)    Lab Results  Component Value Date   CREATININE 1.75 (H) 12/22/2016   CREATININE 1.83 (H) 12/17/2016   CREATININE 2.28 (H) 12/14/2016      Objective:   Physical Exam  Constitutional: He is oriented to person, place, and time. He appears well-developed and well-nourished.  HENT:  Head: Normocephalic and atraumatic.  Neck: Normal range of motion. Neck supple.  Cardiovascular: Normal rate and  regular rhythm.   Pulmonary/Chest: Effort normal. He has no wheezes. He has no rales.  Abdominal: Soft. He exhibits no distension. There is no tenderness.  Musculoskeletal: He exhibits edema (2+ pitting edema in bilateral lower legs with R>L).  Neurological: He is alert and oriented to person, place, and time.  Skin: Skin is warm and dry.  Psychiatric: He has a normal mood and affect. His behavior is normal. Thought content normal.  Nursing note and vitals reviewed.     Assessment & Plan:   1: Chronic heart failure with preserved ejection fraction- - NYHA class II - moderately fluid overloaded today - weighing daily and he was reminded to call for an overnight weight gain of >2 pounds or a weekly weight gain of >5 pounds; weight up 9 pounds since he was last here  - not adding salt; encouraged to read food labels.  - instructed him to elevate his  legs as much as possible - will be picking up his metolazone today as his pharmacy just got it in. Had increased his torsemide to  twice daily for the last few days. Instructed to decrease his torsemide back to  twice daily when he gets the metolazone - saw cardiology Brion Aliment) 12/22/16 and returns 01/31/17 - trying to decrease his fluid consumption down to closer to 60 ounces daily - also encouraged him to get compression socks with application of them every day and removal at bedtime. Also to elevate his legs as much as he can  2: HTN- - BP elevated initially and then starting to decrease when rechecked with a manual cuff (190/100) - says that he's taking all his medications as directed and says that he hasn't missed any doses - instructed him to take an additional  hydralazine today in addition to his normal  TID dose - BMP done 12/22/16 shows sodium 138, potassium 4.4 and GFR 50  3: Leg pain- - continues to report chronic lower leg pain - has recently gotten approved for medicaid and he was encouraged to get established with a  PCP  Patient did not bring his medications nor a list. Each medication was verbally reviewed with the patient and he was encouraged to bring the bottles to every visit to confirm accuracy of list.  Return in 6 weeks or sooner for any questions/problems before then.

## 2017-01-26 NOTE — Patient Instructions (Addendum)
Continue weighing daily and call for an overnight weight gain of > 2 pounds or a weekly weight gain of >5 pounds.  Take an extra hydralazine today due to blood pressure.   Begin metolazone daily for the next few days. Best to take it 1/2 hour prior to torsemide if possible.

## 2017-01-27 ENCOUNTER — Other Ambulatory Visit: Payer: Self-pay

## 2017-01-27 ENCOUNTER — Telehealth: Payer: Self-pay | Admitting: Family

## 2017-01-27 DIAGNOSIS — I5032 Chronic diastolic (congestive) heart failure: Secondary | ICD-10-CM

## 2017-01-27 DIAGNOSIS — M79606 Pain in leg, unspecified: Secondary | ICD-10-CM | POA: Insufficient documentation

## 2017-01-27 DIAGNOSIS — E875 Hyperkalemia: Secondary | ICD-10-CM

## 2017-01-27 MED ORDER — POTASSIUM CHLORIDE CRYS ER 20 MEQ PO TBCR: 20.0000 meq | EXTENDED_RELEASE_TABLET | Freq: Two times a day (BID) | ORAL | 3 refills | Status: DC

## 2017-01-27 NOTE — Telephone Encounter (Signed)
Spoke with patient regarding lab results from yesterday (01/26/17). Potassium level is slightly low so he was instructed to begin taking his potassium as twice daily. He is also to take an additional when he takes the metolazone.  Renal function has worsened some from last month although has improved slightly from 2 months ago. He sees Ward Givens, NP (Cardiology) next week on 01/31/17. Will ask him to recheck his lab work at that time.   Patient verbalized understanding of medication changes.

## 2017-01-31 ENCOUNTER — Ambulatory Visit (INDEPENDENT_AMBULATORY_CARE_PROVIDER_SITE_OTHER): Payer: Medicaid Other | Admitting: Nurse Practitioner

## 2017-01-31 ENCOUNTER — Encounter: Payer: Self-pay | Admitting: Nurse Practitioner

## 2017-01-31 VITALS — BP 160/110 | HR 95 | Ht 72.0 in | Wt 284.2 lb

## 2017-01-31 DIAGNOSIS — I5033 Acute on chronic diastolic (congestive) heart failure: Secondary | ICD-10-CM

## 2017-01-31 DIAGNOSIS — N183 Chronic kidney disease, stage 3 unspecified: Secondary | ICD-10-CM

## 2017-01-31 DIAGNOSIS — I5032 Chronic diastolic (congestive) heart failure: Secondary | ICD-10-CM

## 2017-01-31 DIAGNOSIS — I110 Hypertensive heart disease with heart failure: Secondary | ICD-10-CM

## 2017-01-31 MED ORDER — CARVEDILOL 12.5 MG PO TABS: 12.5000 mg | ORAL_TABLET | Freq: Two times a day (BID) | ORAL | 3 refills | Status: DC

## 2017-01-31 MED ORDER — SPIRONOLACTONE 25 MG PO TABS: 25.0000 mg | ORAL_TABLET | Freq: Every day | ORAL | 3 refills | Status: DC

## 2017-01-31 MED ORDER — TORSEMIDE 20 MG PO TABS: 60.0000 mg | ORAL_TABLET | Freq: Two times a day (BID) | ORAL | 2 refills | Status: DC

## 2017-01-31 MED ORDER — HYDRALAZINE HCL 50 MG PO TABS: 50.0000 mg | ORAL_TABLET | Freq: Three times a day (TID) | ORAL | 3 refills | Status: DC

## 2017-01-31 MED ORDER — ASPIRIN 81 MG PO CHEW: 81.0000 mg | CHEWABLE_TABLET | Freq: Every day | ORAL | 3 refills | Status: AC

## 2017-01-31 NOTE — Progress Notes (Signed)
Office Visit    Patient Name: Trevor Nunez Date of Encounter: 01/31/2017  Primary Care Provider:  Antonieta Iba, MD Primary Cardiologist:  Concha Se, MD   Chief Complaint    51 year old ? With a history of chronic diastolic heart failure, hypertension, noncompliance, chest pain, stage III chronic kidney disease, and dilated aortic root with trivial aortic insufficiency, who presents for follow-up related to HFpEF.  Past Medical History    Past Medical History:  Diagnosis Date  . Chest pain    a. In setting of HTN Urgency w/ trop elevation-->05/2016 MV: no ischemia-->low risk.  . Chronic diastolic CHF (congestive heart failure) (HCC)    a. 10/2015 Echo: EF 55-60%, no rwma; b. 05/2016 Echo: EF 65-70%, sev LVH, Gr2 DD, triv AI, mildly dil LA, nl RV size/fxn, mildly-mod dil RA.  . CKD (chronic kidney disease), stage III (HCC)   . Dilated aortic root (HCC)    a. 10/2015 Echo: Ao root 41mm;  b. 05/2016 Echo: triv AI.  Marland Kitchen Elevated troponin    a. 10/2015, 05/2016, & 10/2016 in the setting of HTN Urgency;  b. 05/2016 MV: no ischemia-->Low risk.  . H/O medication noncompliance   . Hypertension    Past Surgical History:  Procedure Laterality Date  . CHOLECYSTECTOMY    . HERNIA REPAIR      Allergies  Allergies  Allergen Reactions  . Codeine Palpitations    History of Present Illness    31 y /o ? with the above complex PMH including HFpEF, CKD III, noncompliance, HTN, obesity, dilated Ao root w/ triv AI, and chest pain with elevated troponins in the setting of admissions for HTN urgency in the past (10/2015, 05/2016, 10/2016).  Stress testing in 05/2016 was non-ischemic.  Echo @ that time showed nl EF w/ severe LVH and gr2 DD.  I last saw him in early September @ which point, his volume had improved after increasing his torsemide dose in late August.  Renal fxn was stable on f/u.  Last week, he contacted CHF clinic b/c of increasing wt (was 276 lbs on 9/5 visit  reported 4 lbs wt gain from  288  292 lbs on 10/8).  Metolazone 2.5 mg was added x 2 days, though pt never picked up and instead increased his torsemide to 80 BID.  He was seen in clinic on 10/10. Wt down to 286, though markedly hypertensive (190/100).  Advised to take additional hydralazine 50 mg x 1 (on 50 TID @ baseline).  Labs revealed hypokalemia and mild rise in BUN/Creat (23/2.05. Bicarb 31). KCl was called in for him however he has not picked this up yet.  He took metolazone x 2 days as Rx following his CHF visit.  Since then, he has had some improvement in lower ext swelling and abd girth.  Wt down to 285 on his home scale.  He continues to sleep upright in a chair @ night and notes dyspnea with minimal exertion.  Since taking metolazone he has noted muscle soreness in his legs/cramping.  BMET pending today.  Of note, upon review of his meds, he says that he has not taken carvedilol, hydralazine, or lisinopril in the past month, b/c he ran out of them and didn't have the money to refill them.  Now that he has insurance, he says he'll be able to afford them.  Home Medications    Prior to Admission medications   Medication Sig Start Date End Date Taking? Authorizing Provider  allopurinol (ZYLOPRIM)  100 MG tablet Take 1 tablet (100 mg total) by mouth daily. 12/16/16   Iran Ouch, MD  aspirin 81 MG chewable tablet Chew 1 tablet (81 mg total) by mouth daily. Patient not taking: Reported on 01/26/2017 11/18/16   Enid Baas, MD  carvedilol (COREG) 12.5 MG tablet -out x ~ 75month. Take 1 tablet (12.5 mg total) by mouth 2 (two) times daily. 12/07/16 03/07/17  Ok Anis, NP  hydrALAZINE (APRESOLINE) 50 MG tablet -out x ~ 75month. Take 1 tablet (50 mg total) by mouth 3 (three) times daily. 12/22/16 03/22/17  Ok Anis, NP  lisinopril (PRINIVIL,ZESTRIL) 20 MG tablet -out x ~ 75month. Take 1 tablet (20 mg total) by mouth daily. 11/18/16   Enid Baas, MD  metolazone (ZAROXOLYN) 2.5 MG tablet Take 1  tablet (2.5 mg total) by mouth daily. Best to take it 1/2 hour before you take your torsemide 01/24/17 01/26/17  Delma Freeze, FNP  nitroGLYCERIN (NITROSTAT) 0.4 MG SL tablet Place 1 tablet (0.4 mg total) under the tongue every 5 (five) minutes as needed for chest pain. 11/18/16   Enid Baas, MD  potassium chloride SA (K-DUR,KLOR-CON) 20 MEQ tablet - hasn't filled yet. Take 1 tablet (20 mEq total) by mouth 2 (two) times daily. 01/27/17   Delma Freeze, FNP  torsemide (DEMADEX) 20 MG tablet Take 3 tablets (60 mg total) by mouth 2 (two) times daily. 12/14/16 03/14/17  Ok Anis, NP    Review of Systems    DOE, lower ext edema, inc abd girth, wt gain, orthopnea as outlined above.  Lower ext muscle cramping.  No chest pain/palpitations.  All other systems reviewed and are otherwise negative except as noted above.  Physical Exam    VS:  BP (!) 160/110 (BP Location: Left Arm, Patient Position: Sitting, Cuff Size: Large)   Pulse 95   Ht 6' (1.829 m)   Wt 284 lb 4 oz (128.9 kg)   BMI 38.55 kg/m  , BMI Body mass index is 38.55 kg/m. GEN: Well nourished, well developed, in no acute distress.  HEENT: normal.  Neck: Supple, no JVD, carotid bruits, or masses. Cardiac: Irreg, no murmurs, rubs, or gallops. No clubbing, cyanosis, trace ankle edema.  Radials/DP/PT 2+ and equal bilaterally.  Respiratory:  Respirations regular and unlabored, clear to auscultation bilaterally. GI: Obese, firm, BS + x 4. MS: no deformity or atrophy. Skin: warm and dry, no rash. Neuro:  Strength and sensation are intact. Psych: Normal affect.  Accessory Clinical Findings    ECG - RSR, 87, LAD, LVH, PVCs, lat twi - similar to prior ecgs.  Lab Results  Component Value Date   CREATININE 2.05 (H) 01/26/2017   BUN 23 (H) 01/26/2017   NA 144 01/26/2017   K 3.4 (L) 01/26/2017   CL 104 01/26/2017   CO2 31 01/26/2017     Assessment & Plan    1.  Acute on chronic diastolic CHF:  EF 65-70% w/ sev LVH  and Gr2 DD by echo 05/2016.  When I saw him in early Sept, he was feeling well and wt was down to 276 lbs.  Over the past 6 wks however, he has had recurrent issues with volume overload requiring titration of torsemide/addition of metolazone x 2 days last week.  In trying to dig into where we got off track, he says that he's been preparing his own meals and avoiding salt but that he has been out of his carvedilol, hydralazine, and lisinopril for the  better part of the last month.  We discussed the importance of antihypertensive therapy in combination with diuretic therapy in an effort to lower filling pressures and minimize effects of diastolic dysfxn.  BP was 190/100 on 10/10 and is 160/110 today.  I am refilling his carvedilol and hydralazine.  As he hasn't been taking acei and creat up slightly on last check, I will hold off on resuming for now.  Instead, I will add spironolactone, which will at least reduce his risk of hospitalization.  F/u bmet today and I will see him back on 10/23 with plan to repeat bmet @ that time.  Cont torsemide 60 BID.  I hope that with improved HR/BP control, we might be able to get him down to 276.  2.  Hypertensive Heart Dzs w/ CHF:  As above, blood pressure has been quite high in the setting of noncompliance with background antihypertensive therapy.  Resuming carvedilol and hydralazine as above.  Adding spiro.  3.  Stage III CKD:  F/u bmet today.  As he's been off of lisinopril, I will keep him off of this for now, though I would plan to resume in the near future provided that renal fxn stable following diuresis.  4.  ? OSA:  Will need sleep study @ some point.  5.  Dispo:  F/u BMET today and again in one week @ office f/u.   Nicolasa Ducking, NP 01/31/2017, 2:25 PM

## 2017-01-31 NOTE — Patient Instructions (Signed)
Medication Instructions:  Your physician has recommended you make the following change in your medication:  1- STOP Lisinopril. 2- START Spironolactone 25 mg (1 tablet) by mouth once a day. 3- STOP Metolazone.   Labwork: Your physician recommends that you return for lab work in: TODAY (BMP).   Testing/Procedures: none  Follow-Up: Your physician recommends that you schedule a follow-up appointment in: 1 WEEK WITH CHRIS BERGE, NP.    If you need a refill on your cardiac medications before your next appointment, please call your pharmacy.

## 2017-02-01 ENCOUNTER — Other Ambulatory Visit: Payer: Self-pay

## 2017-02-01 DIAGNOSIS — I5032 Chronic diastolic (congestive) heart failure: Secondary | ICD-10-CM

## 2017-02-01 LAB — BASIC METABOLIC PANEL
BUN / CREAT RATIO: 15 (ref 9–20)
BUN: 36 mg/dL — AB (ref 6–24)
CO2: 20 mmol/L (ref 20–29)
CREATININE: 2.44 mg/dL — AB (ref 0.76–1.27)
Calcium: 9.9 mg/dL (ref 8.7–10.2)
Chloride: 99 mmol/L (ref 96–106)
GFR, EST AFRICAN AMERICAN: 34 mL/min/{1.73_m2} — AB (ref >59)
GFR, EST NON AFRICAN AMERICAN: 29 mL/min/{1.73_m2} — AB (ref >59)
Glucose: 135 mg/dL — ABNORMAL HIGH (ref 65–99)
Potassium: 3.6 mmol/L (ref 3.5–5.2)
Sodium: 141 mmol/L (ref 134–144)

## 2017-02-03 ENCOUNTER — Other Ambulatory Visit
Admission: RE | Admit: 2017-02-03 | Discharge: 2017-02-03 | Disposition: A | Discharge status: Home / Self Care | Payer: Medicaid Other | Source: Ambulatory Visit | Attending: Nurse Practitioner | Admitting: Nurse Practitioner

## 2017-02-03 DIAGNOSIS — I5032 Chronic diastolic (congestive) heart failure: Secondary | ICD-10-CM | POA: Diagnosis not present

## 2017-02-03 LAB — BASIC METABOLIC PANEL
ANION GAP: 11 (ref 5–15)
BUN: 25 mg/dL — ABNORMAL HIGH (ref 6–20)
CALCIUM: 8.8 mg/dL — AB (ref 8.9–10.3)
CHLORIDE: 102 mmol/L (ref 101–111)
CO2: 29 mmol/L (ref 22–32)
Creatinine, Ser: 2.02 mg/dL — ABNORMAL HIGH (ref 0.61–1.24)
GFR calc non Af Amer: 36 mL/min — ABNORMAL LOW (ref >60)
GFR, EST AFRICAN AMERICAN: 42 mL/min — AB (ref >60)
Glucose, Bld: 124 mg/dL — ABNORMAL HIGH (ref 65–99)
POTASSIUM: 3.4 mmol/L — AB (ref 3.5–5.1)
Sodium: 142 mmol/L (ref 135–145)

## 2017-02-04 ENCOUNTER — Telehealth: Payer: Self-pay | Admitting: *Deleted

## 2017-02-04 DIAGNOSIS — E876 Hypokalemia: Secondary | ICD-10-CM

## 2017-02-04 NOTE — Telephone Encounter (Signed)
Results called to pt. Pt verbalized understanding. For the Rx's mentioned in result, he just picked up the prescriptions yesterday and started taking the medications as prescribed. He also just started the KCL 20 mEq by mouth twice a day yesterday as well. I advised patient to continue medications as directed at this time and keep upcoming f/u appointment on 02/08/17. He also verbalized understanding [facility] in 1 week on 02/10/17. Patient verbalized understanding of all instructions.

## 2017-02-04 NOTE — Telephone Encounter (Signed)
-----   Message from Ok Anishristopher R Berge, NP sent at 02/04/2017  7:07 AM EDT ----- Renal fxn stable.  K low.  Please make sure that he is taking the following: carvedilol 12.5 bid, hydralazine 50 tid (both refilled @ last visit), torsemide 60 bid, spironolactone 25mg  daily.  At visit, he had not yet filled prev Rx for KCl, thus I'm not sure what he's taking.  If he is taking everything above, as Rx, then he should also take KCl 40 daily (if he is already taking KCl, he needs 40 meq more).  If he has not yet started spiro, for whatever reason, he should start that and not take any additional KCl yet.  In either case, he will need a f/u bmet next week - we can get when I see him Tuesday.

## 2017-02-08 ENCOUNTER — Ambulatory Visit (INDEPENDENT_AMBULATORY_CARE_PROVIDER_SITE_OTHER): Payer: Medicaid Other | Admitting: Nurse Practitioner

## 2017-02-08 ENCOUNTER — Encounter: Payer: Self-pay | Admitting: Nurse Practitioner

## 2017-02-08 VITALS — BP 128/98 | HR 89 | Ht 72.0 in | Wt 285.5 lb

## 2017-02-08 DIAGNOSIS — I110 Hypertensive heart disease with heart failure: Secondary | ICD-10-CM | POA: Diagnosis not present

## 2017-02-08 DIAGNOSIS — I5032 Chronic diastolic (congestive) heart failure: Secondary | ICD-10-CM | POA: Diagnosis not present

## 2017-02-08 DIAGNOSIS — N183 Chronic kidney disease, stage 3 unspecified: Secondary | ICD-10-CM

## 2017-02-08 NOTE — Patient Instructions (Signed)
Medication Instructions:  Please continue your current medications  Labwork: BMET   Testing/Procedures: None  Follow-Up: As scheduled  If you need a refill on your cardiac medications before your next appointment, please call your pharmacy.

## 2017-02-08 NOTE — Progress Notes (Signed)
Office Visit    Patient Name: Trevor Nunez Date of Encounter: 02/08/2017  Primary Care Provider:  Antonieta Iba, MD Primary Cardiologist:  Concha Se, MD   Chief Complaint    51 y/o ? with a history of chronic diastolic heart failure, hypertension, noncompliance, chest pain, stage III chronic kidney disease, and dilated aortic root with trivial AI, who presents for follow-up related to HFpEF.  Past Medical History    Past Medical History:  Diagnosis Date  . Chest pain    a. In setting of HTN Urgency w/ trop elevation-->05/2016 MV: no ischemia-->low risk.  . Chronic diastolic CHF (congestive heart failure) (HCC)    a. 10/2015 Echo: EF 55-60%, no rwma; b. 05/2016 Echo: EF 65-70%, sev LVH, Gr2 DD, triv AI, mildly dil LA, nl RV size/fxn, mildly-mod dil RA.  . CKD (chronic kidney disease), stage III (HCC)   . Dilated aortic root (HCC)    a. 10/2015 Echo: Ao root 41mm;  b. 05/2016 Echo: triv AI.  Marland Kitchen Elevated troponin    a. 10/2015, 05/2016, & 10/2016 in the setting of HTN Urgency;  b. 05/2016 MV: no ischemia-->Low risk.  . H/O medication noncompliance   . Hypertension    Past Surgical History:  Procedure Laterality Date  . CHOLECYSTECTOMY    . HERNIA REPAIR      Allergies  Allergies  Allergen Reactions  . Codeine Palpitations    History of Present Illness    50 year old ? with the above complex past medical history including HFpEF, CKD 3, noncompliance, hypertension, obesity, dilated aortic root with trivial AI, and chest pain with elevated troponins in the setting of admissions for hypertensive urgency in the past.  Stress testing in February 2018 was nonischemic.  Echo at that time showed normal LV function with severe LVH and grade 2 diastolic dysfunction.  I saw him 1 week ago in the setting of volume overload and hypertension in the setting of noncompliance with his medications.  His previous dry weight was 276 pounds September 5 and his weight had gone to a max of 292.   After increasing his torsemide on his own, weight came down to 286.  When I saw him his blood pressure was 190/100.  I resumed carvedilol and hydralazine and also added Spironolactone.  Follow-up blood chemistry initially showed some rise in creatinine but repeat on October 18 showed stability.  He says that since his last visit, he is feeling much better.  He denies lower extremity edema, orthopnea, or any increase in abdominal girth.  He is able to be more active and has been walking some and also volunteering with early loading.  Just getting out of the house, he notes that he feels better.  He denies PND, orthopnea, dizziness, syncope, chest pain, palpitations, or early satiety.  He has been compliant with medications and blood pressure is better today at 128/98.  Home Medications    Prior to Admission medications   Medication Sig Start Date End Date Taking? Authorizing Provider  allopurinol (ZYLOPRIM) 100 MG tablet Take 1 tablet (100 mg total) by mouth daily. 12/16/16  Yes Iran Ouch, MD  aspirin 81 MG chewable tablet Chew 1 tablet (81 mg total) by mouth daily. 01/31/17  Yes Ok Anis, NP  carvedilol (COREG) 12.5 MG tablet Take 1 tablet (12.5 mg total) by mouth 2 (two) times daily. 01/31/17 05/01/17 Yes Ok Anis, NP  hydrALAZINE (APRESOLINE) 50 MG tablet Take 1 tablet (50 mg total) by mouth  3 (three) times daily. 01/31/17 05/01/17 Yes Ok Anis, NP  nitroGLYCERIN (NITROSTAT) 0.4 MG SL tablet Place 1 tablet (0.4 mg total) under the tongue every 5 (five) minutes as needed for chest pain. 11/18/16  Yes Enid Baas, MD  potassium chloride SA (K-DUR,KLOR-CON) 20 MEQ tablet Take 1 tablet (20 mEq total) by mouth 2 (two) times daily. 01/27/17  Yes Hackney, Inetta Fermo A, FNP  spironolactone (ALDACTONE) 25 MG tablet Take 1 tablet (25 mg total) by mouth daily. 01/31/17 05/01/17 Yes Ok Anis, NP  torsemide (DEMADEX) 20 MG tablet Take 3 tablets (60 mg total) by  mouth 2 (two) times daily. 01/31/17 05/01/17 Yes Ok Anis, NP    Review of Systems    He still has some dyspnea on exertion but overall is doing better.  He denies chest pain, palpitations,pnd, orthopnea, n, v, dizziness, syncope, edema, weight gain, or early satiety.  All other systems reviewed and are otherwise negative except as noted above.  Physical Exam    VS:  BP (!) 128/98 (BP Location: Left Arm, Patient Position: Sitting, Cuff Size: Large)   Pulse 89   Ht 6' (1.829 m)   Wt 285 lb 8 oz (129.5 kg)   BMI 38.72 kg/m  , BMI Body mass index is 38.72 kg/m. GEN: Well nourished, well developed, in no acute distress.  HEENT: normal.  Neck: Supple, no JVD, carotid bruits, or masses. Cardiac: Irregular/frequent ectopy, no murmurs, rubs, or gallops. No clubbing, cyanosis, edema.  Radials/DP/PT 2+ and equal bilaterally.  Respiratory:  Respirations regular and unlabored, clear to auscultation bilaterally. GI: Obese, firm-though softer than last week, BS + x 4. MS: no deformity or atrophy. Skin: warm and dry, no rash. Neuro:  Strength and sensation are intact. Psych: Normal affect.  Accessory Clinical Findings    Lab Results  Component Value Date   CREATININE 2.02 (H) 02/03/2017   BUN 25 (H) 02/03/2017   NA 142 02/03/2017   K 3.4 (L) 02/03/2017   CL 102 02/03/2017   CO2 29 02/03/2017     Assessment & Plan    1.  Chronic diastolic congestive heart failure: EF 65-70% with severe LVH and grade 2 diastolic dysfunction by echo in February 2018.  I saw him 1 week ago and he had been off of his carvedilol, hydralazine, and lisinopril for the better part of a month.  I refilled these and continued his torsemide at 60 twice daily.  Follow-up labs showed stable renal function with mild hypokalemia on October 18.  He is feeling better with more energy and improved exercise tolerance.  Blood pressure is improved today at 128/98.  Though he had a previous dry weight of 276 in early  September, given improved symptoms and stable exam at a weight of 285, I suspect he may have put on some non-fluid weight.  We talked about the importance of weight loss including calorie restrictions and increase activity.  I will follow-up a basic metabolic panel today.  Continue current doses of beta-blocker, hydralazine, spironolactone, and torsemide.  2.  Hypertensive heart disease with CHF: Overall, blood pressure improved.  Diastolic remains elevated but systolic is much better.  I stressed the importance of compliance.  Continue current regimen and follow-up basic metabolic panel today.  3.  Stage III chronic kidney disease: Follow-up basic metabolic panel.  ACE inhibitor is on hold in the setting of recent elevations in creatinine.  4.  Obstructive sleep apnea: Once he is able, he will need referral for  sleep study.  5.  Disposition: Follow-up basic metabolic panel today.  He has follow-up with heart failure clinic in just over a month.  As long as he is stable, we can plan to keep that with future follow-up determined through the heart failure clinic.  I did advise that he have a low threshold to contact us for any weight gain or recurrent symptoms.   Nicolasa Duckinghristopher Berge, NP 02/08/2017, 2:48 PM

## 2017-02-09 LAB — BASIC METABOLIC PANEL
BUN / CREAT RATIO: 13 (ref 9–20)
BUN: 23 mg/dL (ref 6–24)
CHLORIDE: 100 mmol/L (ref 96–106)
CO2: 23 mmol/L (ref 20–29)
Calcium: 9.5 mg/dL (ref 8.7–10.2)
Creatinine, Ser: 1.73 mg/dL — ABNORMAL HIGH (ref 0.76–1.27)
GFR calc Af Amer: 51 mL/min/{1.73_m2} — ABNORMAL LOW (ref >59)
GFR calc non Af Amer: 44 mL/min/{1.73_m2} — ABNORMAL LOW (ref >59)
GLUCOSE: 121 mg/dL — AB (ref 65–99)
Potassium: 4.3 mmol/L (ref 3.5–5.2)
SODIUM: 141 mmol/L (ref 134–144)

## 2017-03-23 ENCOUNTER — Ambulatory Visit: Payer: Self-pay | Admitting: Family

## 2017-03-23 ENCOUNTER — Telehealth: Payer: Self-pay | Admitting: Family

## 2017-03-23 NOTE — Telephone Encounter (Signed)
Patient did not show for his Heart Failure Clinic appointment on 03/23/17. Will attempt to reschedule.  

## 2017-03-23 NOTE — Progress Notes (Deleted)
Subjective:    Patient ID: Trevor Nunez, male    DOB: [birth year], 51 y.o.   MRN: [id number]  HPI   Trevor Nunez is a 51 y/o male with a history of HTN, CKD (stage III), previous tobacco use and chronic heart failure.  Reviewed echo report done 11/17/16 which showed an EF of 60-65% along with mild AR. Previous echo was done 06/15/16 and showed an EF of 65-70% along with trivial AR and mildly increased right ventricle wall thickness. This is unchanged from previous one back in July 2017.  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 MRA of aorta ordered but patient refused to lay flat in MRI. NTG drip needed initially and then medications were re-introduced. Discharged home after 2 days. Was in the ED 09/21/16 due to pneumonia. Treated and released. Admitted 06/14/16 with HTN and HF exacerbation along with chest pain. Initially treated with IV diuretics. Had an elevated troponin thought to be due to demand ischemia. Had a stress test which was read as low risk by cardiology. Discharged home. Admitted 10/17/15 with HTN and HF exacerbation along with chest pain. Elevated troponin thought to be due to demand ischemia. Cardiology consult done. Discharged home.   He presents today for a follow-up visit with a chief complaint of minimal shortness of breath upon moderate exertion. He describes this as chronic in nature having been present for several years. He has associated fatigue, intermittent chest pain, edema, palpitations, dizziness, anxiety, difficulty sleeping and weight gain. He denies wheezing. He continues to have chronic lower leg pain. Has recently gotten approved for his medicaid.   Past Medical History:  Diagnosis Date  . Chest pain    a. In setting of HTN Urgency w/ trop elevation-->05/2016 MV: no ischemia-->low risk.  . Chronic diastolic CHF (congestive heart failure) (HCC)    a. 10/2015 Echo: EF 55-60%, no rwma; b. 05/2016 Echo: EF 65-70%, sev LVH, Gr2 DD, triv AI, mildly dil LA, nl RV size/fxn, mildly-mod  dil RA.  . CKD (chronic kidney disease), stage III (HCC)   . Dilated aortic root (HCC)    a. 10/2015 Echo: Ao root 41mm;  b. 05/2016 Echo: triv AI.  Marland Kitchen. Elevated troponin    a. 10/2015, 05/2016, & 10/2016 in the setting of HTN Urgency;  b. 05/2016 MV: no ischemia-->Low risk.  . H/O medication noncompliance   . Hypertension    Past Surgical History:  Procedure Laterality Date  . CHOLECYSTECTOMY    . HERNIA REPAIR     Family History  Problem Relation Age of Onset  . Hypertension Mother   . Heart attack Mother   . CVA Mother   . Heart disease Sister    Social History   Tobacco Use  . Smoking status: Former Smoker    Last attempt to quit: 08/17/2015    Years since quitting: 1.6  . Smokeless tobacco: Never Used  Substance Use Topics  . Alcohol use: No    Alcohol/week: 0.0 oz    Allergies  Allergen Reactions  . Codeine Palpitations     Review of Systems  Constitutional: Positive for fatigue. Negative for appetite change.  HENT: Negative for congestion, postnasal drip and sore throat.   Eyes: Negative.   Respiratory: Positive for shortness of breath. Negative for cough and chest tightness.   Cardiovascular: Positive for chest pain, palpitations and leg swelling (better).  Gastrointestinal: Negative for abdominal distention and abdominal pain.  Endocrine: Negative.   Genitourinary: Negative.   Musculoskeletal: Positive for arthralgias (both knees).  Negative for back pain.  Skin: Negative.   Allergic/Immunologic: Negative.   Neurological: Positive for dizziness (sometimes) and headaches. Negative for light-headedness.           Hematological: Negative for adenopathy. Does not bruise/bleed easily.  Psychiatric/Behavioral: Positive for sleep disturbance (not sleeping well ). Negative for dysphoric mood and suicidal ideas. The patient is nervous/anxious (increasing panic attacks).      Lab Results  Component Value Date   CREATININE 1.73 (H) 02/08/2017   CREATININE 2.02 (H)  02/03/2017   CREATININE 2.44 (H) 01/31/2017      Objective:   Physical Exam  Constitutional: He is oriented to person, place, and time. He appears well-developed and well-nourished.  HENT:  Head: Normocephalic and atraumatic.  Neck: Normal range of motion. Neck supple.  Cardiovascular: Normal rate and regular rhythm.  Pulmonary/Chest: Effort normal. He has no wheezes. He has no rales.  Abdominal: Soft. He exhibits no distension. There is no tenderness.  Musculoskeletal: He exhibits edema (2+ pitting edema in bilateral lower legs with R>L).  Neurological: He is alert and oriented to person, place, and time.  Skin: Skin is warm and dry.  Psychiatric: He has a normal mood and affect. His behavior is normal. Thought content normal.  Nursing note and vitals reviewed.     Assessment & Plan:   1: Chronic heart failure with preserved ejection fraction- - NYHA class II - moderately fluid overloaded today - weighing daily and he was reminded to call for an overnight weight gain of >2 pounds or a weekly weight gain of >5 pounds; weight up 9 pounds since he was last here  - not adding salt; encouraged to read food labels.  - instructed him to elevate his legs as much as possible - will be picking up his metolazone today as his pharmacy just got it in. Had increased his torsemide to 80mg  twice daily for the last few days. Instructed to decrease his torsemide back to 60mg  twice daily when he gets the metolazone - saw cardiology Brion Aliment(Berge) 12/22/16 and returns 01/31/17 - trying to decrease his fluid consumption down to closer to 60 ounces daily - also encouraged him to get compression socks with application of them every day and removal at bedtime. Also to elevate his legs as much as he can  2: HTN- - BP elevated initially and then starting to decrease when rechecked with a manual cuff (190/100) - says that he's taking all his medications as directed and says that he hasn't missed any doses -  instructed him to take an additional 50mg  hydralazine today in addition to his normal 50mg  TID dose - BMP done 12/22/16 shows sodium 138, potassium 4.4 and GFR 50  3: Leg pain- - continues to report chronic lower leg pain - has recently gotten approved for medicaid and he was encouraged to get established with a PCP  Patient did not bring his medications nor a list. Each medication was verbally reviewed with the patient and he was encouraged to bring the bottles to every visit to confirm accuracy of list.  Return in 6 weeks or sooner for any questions/problems before then.

## 2017-05-06 ENCOUNTER — Encounter: Payer: Self-pay | Admitting: Family

## 2017-05-06 ENCOUNTER — Ambulatory Visit: Payer: Medicaid Other | Attending: Family | Admitting: Family

## 2017-05-06 ENCOUNTER — Other Ambulatory Visit: Payer: Self-pay

## 2017-05-06 VITALS — BP 152/106 | HR 100 | Temp 98.4°F | Resp 22 | Ht 72.0 in | Wt 303.5 lb

## 2017-05-06 DIAGNOSIS — I5033 Acute on chronic diastolic (congestive) heart failure: Secondary | ICD-10-CM | POA: Insufficient documentation

## 2017-05-06 DIAGNOSIS — Z9049 Acquired absence of other specified parts of digestive tract: Secondary | ICD-10-CM | POA: Insufficient documentation

## 2017-05-06 DIAGNOSIS — I10 Essential (primary) hypertension: Secondary | ICD-10-CM

## 2017-05-06 DIAGNOSIS — I130 Hypertensive heart and chronic kidney disease with heart failure and stage 1 through stage 4 chronic kidney disease, or unspecified chronic kidney disease: Secondary | ICD-10-CM | POA: Insufficient documentation

## 2017-05-06 DIAGNOSIS — M79671 Pain in right foot: Secondary | ICD-10-CM | POA: Diagnosis not present

## 2017-05-06 DIAGNOSIS — Z7982 Long term (current) use of aspirin: Secondary | ICD-10-CM | POA: Diagnosis not present

## 2017-05-06 DIAGNOSIS — Z79899 Other long term (current) drug therapy: Secondary | ICD-10-CM | POA: Insufficient documentation

## 2017-05-06 DIAGNOSIS — Z87891 Personal history of nicotine dependence: Secondary | ICD-10-CM | POA: Insufficient documentation

## 2017-05-06 MED ORDER — METOLAZONE 2.5 MG PO TABS: 2.5000 mg | ORAL_TABLET | Freq: Every day | ORAL | 0 refills | Status: DC

## 2017-05-06 MED ORDER — POTASSIUM CHLORIDE CRYS ER 20 MEQ PO TBCR: 20.0000 meq | EXTENDED_RELEASE_TABLET | Freq: Two times a day (BID) | ORAL | 3 refills | Status: DC

## 2017-05-06 NOTE — Patient Instructions (Addendum)
Continue weighing daily and call for an overnight weight gain of > 2 pounds or a weekly weight gain of >5 pounds.  Take metolazone daily for the next 3 days. Best to take it 1/2 hour prior to torsemide if able.  For these next 3 days, double your potassium and take 2 tablets twice daily.

## 2017-05-06 NOTE — Progress Notes (Signed)
Subjective:    Patient ID: Trevor Nunez, male    DOB: May 10, 1965, 52 y.o.   MRN: [id number]  HPI   Mr Trevor Nunez is a 52 y/o male with a history of HTN, CKD (stage III), previous tobacco use and chronic heart failure.  Reviewed echo report done 11/17/16 which showed an EF of 60-65% along with mild AR. Previous echo was done 06/15/16 and showed an EF of 65-70% along with trivial AR and mildly increased right ventricle wall thickness. This is unchanged from previous one back in July 2017.  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 MRA of aorta ordered but patient refused to lay flat in MRI. NTG drip needed initially and then medications were re-introduced. Discharged home after 2 days. Was in the ED 09/21/16 due to pneumonia. Treated and released. Admitted 06/14/16 with HTN and HF exacerbation along with chest pain. Initially treated with IV diuretics. Had an elevated troponin thought to be due to demand ischemia. Had a stress test which was read as low risk by cardiology. Discharged home. Admitted 10/17/15 with HTN and HF exacerbation along with chest pain. Elevated troponin thought to be due to demand ischemia. Cardiology consult done. Discharged home.   He presents today for a follow-up visit with a chief complaint of moderate shortness of breath upon minimal exertion. He says this has been chronic in nature having been present for several years although he feels like it's gotten worse over the last week or so. He has associated fatigue, dizziness, headache, chest pain, edema, palpitations, abdominal distention, difficulty sleeping and weight gain. Denies any cough or missing any medications.   Past Medical History:  Diagnosis Date  . Chest pain    a. In setting of HTN Urgency w/ trop elevation-->05/2016 MV: no ischemia-->low risk.  . Chronic diastolic CHF (congestive heart failure) (HCC)    a. 10/2015 Echo: EF 55-60%, no rwma; b. 05/2016 Echo: EF 65-70%, sev LVH, Gr2 DD, triv AI, mildly dil LA, nl RV size/fxn,  mildly-mod dil RA.  . CKD (chronic kidney disease), stage III (HCC)   . Dilated aortic root (HCC)    a. 10/2015 Echo: Ao root 41mm;  b. 05/2016 Echo: triv AI.  Marland Kitchen. Elevated troponin    a. 10/2015, 05/2016, & 10/2016 in the setting of HTN Urgency;  b. 05/2016 MV: no ischemia-->Low risk.  . H/O medication noncompliance   . Hypertension    Past Surgical History:  Procedure Laterality Date  . CHOLECYSTECTOMY    . HERNIA REPAIR     Family History  Problem Relation Age of Onset  . Hypertension Mother   . Heart attack Mother   . CVA Mother   . Heart disease Sister    Social History   Tobacco Use  . Smoking status: Former Smoker    Last attempt to quit: 08/17/2015    Years since quitting: 1.7  . Smokeless tobacco: Never Used  Substance Use Topics  . Alcohol use: No    Alcohol/week: 0.0 oz    Allergies  Allergen Reactions  . Codeine Palpitations   Prior to Admission medications   Medication Sig Start Date End Date Taking? Authorizing Provider  allopurinol (ZYLOPRIM) 100 MG tablet Take 1 tablet (100 mg total) by mouth daily. 12/16/16  Yes Iran OuchArida, Muhammad A, MD  aspirin 81 MG chewable tablet Chew 1 tablet (81 mg total) by mouth daily. 01/31/17  Yes Creig HinesBerge, Christopher Ronald, NP  carvedilol (COREG) 12.5 MG tablet Take 12.5 mg by mouth 2 (two) times daily  with a meal.   Yes [provider]  hydrALAZINE (APRESOLINE) 50 MG tablet Take 50 mg by mouth 3 (three) times daily.   Yes [provider]  nitroGLYCERIN (NITROSTAT) 0.4 MG SL tablet Place 1 tablet (0.4 mg total) under the tongue every 5 (five) minutes as needed for chest pain. 11/18/16  Yes Enid Baas, MD  potassium chloride SA (K-DUR,KLOR-CON) 20 MEQ tablet Take 1 tablet (20 mEq total) by mouth 2 (two) times daily. 01/27/17  Yes Hackney, Inetta Fermo A, FNP  spironolactone (ALDACTONE) 25 MG tablet Take 25 mg by mouth daily.   Yes [provider]  torsemide (DEMADEX) 20 MG tablet Take 60 mg by mouth 2 (two) times  daily.   Yes [provider]   Review of Systems  Constitutional: Positive for fatigue. Negative for appetite change.  HENT: Negative for congestion, postnasal drip and sore throat.   Eyes: Negative.   Respiratory: Positive for shortness of breath. Negative for cough and chest tightness.   Cardiovascular: Positive for chest pain, palpitations and leg swelling (right leg).  Gastrointestinal: Positive for abdominal distention. Negative for abdominal pain.  Endocrine: Negative.   Genitourinary: Negative.   Musculoskeletal: Positive for arthralgias (right foot). Negative for back pain.  Skin: Negative.   Allergic/Immunologic: Negative.   Neurological: Positive for dizziness (sometimes) and headaches. Negative for light-headedness.  Hematological: Negative for adenopathy. Does not bruise/bleed easily.  Psychiatric/Behavioral: Positive for sleep disturbance (not sleeping well ). Negative for dysphoric mood and suicidal ideas. The patient is not nervous/anxious.    Vitals:   05/06/17 1308  BP: (!) 152/106  Pulse: 100  Resp: (!) 22  Temp: 98.4 F (36.9 C)  TempSrc: Oral  SpO2: 97%  Weight: (!) 303 lb 8 oz (137.7 kg)  Height: 6' (1.829 m)   Wt Readings from Last 3 Encounters:  05/06/17 (!) 303 lb 8 oz (137.7 kg)  02/08/17 285 lb 8 oz (129.5 kg)  01/31/17 284 lb 4 oz (128.9 kg)   Lab Results  Component Value Date   CREATININE 1.73 (H) 02/08/2017   CREATININE 2.02 (H) 02/03/2017   CREATININE 2.44 (H) 01/31/2017      Objective:   Physical Exam  Constitutional: He is oriented to person, place, and time. He appears well-developed and well-nourished.  HENT:  Head: Normocephalic and atraumatic.  Neck: Normal range of motion. Neck supple.  Cardiovascular: Normal rate and regular rhythm.  Pulmonary/Chest: Effort normal. He has no wheezes. He has no rales.  Abdominal: He exhibits distension. There is no tenderness.  Musculoskeletal: He exhibits edema (2+ pitting edema in  bilateral lower legs with R>L).  Neurological: He is alert and oriented to person, place, and time.  Skin: Skin is warm and dry.  Psychiatric: He has a normal mood and affect. His behavior is normal. Thought content normal.  Nursing note and vitals reviewed.     Assessment & Plan:   1: Acute on Chronic heart failure with preserved ejection fraction- - NYHA class III - moderately fluid overloaded today - weighing daily and he was reminded to call for an overnight weight gain of >2 pounds or a weekly weight gain of >5 pounds - weight up 17.2 pounds since 02/08/17 - not adding salt; encouraged to read food labels.  - instructed him to elevate his legs as much as possible - will add metolazone 2.5mg  daily for 3 days with increasing potassium for these 3 days a well to BID - saw cardiology Brion Aliment) 02/08/17 - will check  a BMP next week since adding metolazone  2: HTN- - BP elevated and was still elevated upon recheck - weight up 17+ pounds  - adding metolazone per above - says that he's taking all his medications as directed and says that he hasn't missed any doses - BMP done 02/08/17 shows sodium 141, potassium 4.3 and GFR 51  3: Foot pain- - has had right foot pain for ~ 1 week - says that he's not had gout previously - advised him to follow-up with PCP about this  Patient did not bring his medications nor a list. Each medication was verbally reviewed with the patient and he was encouraged to bring the bottles to every visit to confirm accuracy of list.  Return on 05/11/17 for recheck of symptoms and to do lab work

## 2017-05-07 DIAGNOSIS — M79671 Pain in right foot: Secondary | ICD-10-CM | POA: Insufficient documentation

## 2017-05-11 ENCOUNTER — Telehealth: Payer: Self-pay | Admitting: Family

## 2017-05-11 ENCOUNTER — Ambulatory Visit: Payer: Medicaid Other | Admitting: Family

## 2017-05-11 NOTE — Telephone Encounter (Signed)
Patient did not show for his Heart Failure Clinic appointment on 05/11/17. Will attempt to reschedule.   This is the 4th appointment that he has missed.

## 2017-05-11 NOTE — Progress Notes (Deleted)
Subjective:    Patient ID: Trevor Nunez, male    DOB: 1966/01/30, 52 y.o.   MRN: [id number]  HPI   Trevor Nunez is a 52 y/o male with a history of HTN, CKD (stage III), previous tobacco use and chronic heart failure.  Reviewed echo report done 11/17/16 which showed an EF of 60-65% along with mild AR. Previous echo was done 06/15/16 and showed an EF of 65-70% along with trivial AR and mildly increased right ventricle wall thickness. This is unchanged from previous one back in July 2017.  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 MRA of aorta ordered but patient refused to lay flat in MRI. NTG drip needed initially and then medications were re-introduced. Discharged home after 2 days. Was in the ED 09/21/16 due to pneumonia. Treated and released. Admitted 06/14/16 with HTN and HF exacerbation along with chest pain. Initially treated with IV diuretics. Had an elevated troponin thought to be due to demand ischemia. Had a stress test which was read as low risk by cardiology. Discharged home. Admitted 10/17/15 with HTN and HF exacerbation along with chest pain. Elevated troponin thought to be due to demand ischemia. Cardiology consult done. Discharged home.   He presents today for a follow-up visit with a chief complaint of    Past Medical History:  Diagnosis Date  . Chest pain    a. In setting of HTN Urgency w/ trop elevation-->05/2016 MV: no ischemia-->low risk.  . Chronic diastolic CHF (congestive heart failure) (HCC)    a. 10/2015 Echo: EF 55-60%, no rwma; b. 05/2016 Echo: EF 65-70%, sev LVH, Gr2 DD, triv AI, mildly dil LA, nl RV size/fxn, mildly-mod dil RA.  . CKD (chronic kidney disease), stage III (HCC)   . Dilated aortic root (HCC)    a. 10/2015 Echo: Ao root 41mm;  b. 05/2016 Echo: triv AI.  Marland Kitchen. Elevated troponin    a. 10/2015, 05/2016, & 10/2016 in the setting of HTN Urgency;  b. 05/2016 MV: no ischemia-->Low risk.  . H/O medication noncompliance   . Hypertension    Past Surgical History:  Procedure  Laterality Date  . CHOLECYSTECTOMY    . HERNIA REPAIR     Family History  Problem Relation Age of Onset  . Hypertension Mother   . Heart attack Mother   . CVA Mother   . Heart disease Sister    Social History   Tobacco Use  . Smoking status: Former Smoker    Last attempt to quit: 08/17/2015    Years since quitting: 1.7  . Smokeless tobacco: Never Used  Substance Use Topics  . Alcohol use: No    Alcohol/week: 0.0 oz    Allergies  Allergen Reactions  . Codeine Palpitations    Review of Systems  Constitutional: Positive for fatigue. Negative for appetite change.  HENT: Negative for congestion, postnasal drip and sore throat.   Eyes: Negative.   Respiratory: Positive for shortness of breath. Negative for cough and chest tightness.   Cardiovascular: Positive for chest pain, palpitations and leg swelling (right leg).  Gastrointestinal: Positive for abdominal distention. Negative for abdominal pain.  Endocrine: Negative.   Genitourinary: Negative.   Musculoskeletal: Positive for arthralgias (right foot). Negative for back pain.  Skin: Negative.   Allergic/Immunologic: Negative.   Neurological: Positive for dizziness (sometimes) and headaches. Negative for light-headedness.  Hematological: Negative for adenopathy. Does not bruise/bleed easily.  Psychiatric/Behavioral: Positive for sleep disturbance (not sleeping well ). Negative for dysphoric mood and suicidal ideas. The patient is  not nervous/anxious.        Objective:   Physical Exam  Constitutional: He is oriented to person, place, and time. He appears well-developed and well-nourished.  HENT:  Head: Normocephalic and atraumatic.  Neck: Normal range of motion. Neck supple.  Cardiovascular: Normal rate and regular rhythm.  Pulmonary/Chest: Effort normal. He has no wheezes. He has no rales.  Abdominal: He exhibits distension. There is no tenderness.  Musculoskeletal: He exhibits edema (2+ pitting edema in bilateral  lower legs with R>L).  Neurological: He is alert and oriented to person, place, and time.  Skin: Skin is warm and dry.  Psychiatric: He has a normal mood and affect. His behavior is normal. Thought content normal.  Nursing note and vitals reviewed.     Assessment & Plan:   1: Acute on Chronic heart failure with preserved ejection fraction- - NYHA class III - moderately fluid overloaded today - weighing daily and he was reminded to call for an overnight weight gain of >2 pounds or a weekly weight gain of >5 pounds - weight up 17.2 pounds since 02/08/17 - not adding salt; encouraged to read food labels.  - instructed him to elevate his legs as much as possible -  - saw cardiology Brion Aliment) 02/08/17 - will check a BMP today since took metolazone last week  2: HTN- - BP elevated and was still elevated upon recheck - weight up 17+ pounds  -  - says that he's taking all his medications as directed and says that he hasn't missed any doses - BMP done 02/08/17 shows sodium 141, potassium 4.3 and GFR 51  3: Foot pain- - has had right foot pain for ~ 1 week - says that he's not had gout previously - advised him to follow-up with PCP about this  Patient did not bring his medications nor a list. Each medication was verbally reviewed with the patient and he was encouraged to bring the bottles to every visit to confirm accuracy of list.

## 2017-06-03 ENCOUNTER — Ambulatory Visit: Payer: Medicaid Other | Attending: Internal Medicine

## 2017-06-03 DIAGNOSIS — R0683 Snoring: Secondary | ICD-10-CM | POA: Insufficient documentation

## 2017-06-03 DIAGNOSIS — G4733 Obstructive sleep apnea (adult) (pediatric): Secondary | ICD-10-CM | POA: Insufficient documentation

## 2017-06-03 DIAGNOSIS — G4761 Periodic limb movement disorder: Secondary | ICD-10-CM | POA: Diagnosis not present

## 2017-07-16 ENCOUNTER — Emergency Department
Admission: EM | Admit: 2017-07-16 | Discharge: 2017-07-16 | Disposition: A | Discharge status: Home / Self Care | Payer: Medicaid Other | Attending: Emergency Medicine | Admitting: Emergency Medicine

## 2017-07-16 ENCOUNTER — Emergency Department: Payer: Medicaid Other

## 2017-07-16 ENCOUNTER — Encounter: Payer: Self-pay | Admitting: Emergency Medicine

## 2017-07-16 ENCOUNTER — Other Ambulatory Visit: Payer: Self-pay

## 2017-07-16 DIAGNOSIS — Z79899 Other long term (current) drug therapy: Secondary | ICD-10-CM | POA: Insufficient documentation

## 2017-07-16 DIAGNOSIS — M25421 Effusion, right elbow: Secondary | ICD-10-CM | POA: Diagnosis not present

## 2017-07-16 DIAGNOSIS — Z87891 Personal history of nicotine dependence: Secondary | ICD-10-CM | POA: Insufficient documentation

## 2017-07-16 DIAGNOSIS — Z7982 Long term (current) use of aspirin: Secondary | ICD-10-CM | POA: Diagnosis not present

## 2017-07-16 DIAGNOSIS — G8929 Other chronic pain: Secondary | ICD-10-CM | POA: Diagnosis not present

## 2017-07-16 DIAGNOSIS — I5032 Chronic diastolic (congestive) heart failure: Secondary | ICD-10-CM | POA: Insufficient documentation

## 2017-07-16 DIAGNOSIS — M545 Low back pain, unspecified: Secondary | ICD-10-CM

## 2017-07-16 DIAGNOSIS — I130 Hypertensive heart and chronic kidney disease with heart failure and stage 1 through stage 4 chronic kidney disease, or unspecified chronic kidney disease: Secondary | ICD-10-CM | POA: Diagnosis not present

## 2017-07-16 DIAGNOSIS — N183 Chronic kidney disease, stage 3 (moderate): Secondary | ICD-10-CM | POA: Diagnosis not present

## 2017-07-16 MED ORDER — CYCLOBENZAPRINE HCL 10 MG PO TABS
10.0000 mg | ORAL_TABLET | Freq: Once | ORAL | Status: AC
  Administered 2017-07-16: 10 mg via ORAL
  Filled 2017-07-16: qty 1

## 2017-07-16 MED ORDER — KETOROLAC TROMETHAMINE 30 MG/ML IJ SOLN
30.0000 mg | Freq: Once | INTRAMUSCULAR | Status: AC
  Administered 2017-07-16: 30 mg via INTRAMUSCULAR
  Filled 2017-07-16: qty 1

## 2017-07-16 MED ORDER — OXYCODONE-ACETAMINOPHEN 5-325 MG PO TABS: 1.0000 | ORAL_TABLET | ORAL | 0 refills | Status: DC | PRN

## 2017-07-16 MED ORDER — CYCLOBENZAPRINE HCL 5 MG PO TABS: ORAL_TABLET | ORAL | 0 refills | Status: DC

## 2017-07-16 MED ORDER — OXYCODONE-ACETAMINOPHEN 5-325 MG PO TABS
1.0000 | ORAL_TABLET | Freq: Once | ORAL | Status: AC
  Administered 2017-07-16: 1 via ORAL
  Filled 2017-07-16: qty 1

## 2017-07-16 MED ORDER — LIDOCAINE 5 % EX PTCH: 1.0000 | MEDICATED_PATCH | Freq: Two times a day (BID) | CUTANEOUS | 0 refills | Status: DC

## 2017-07-16 MED ORDER — LIDOCAINE 5 % EX PTCH
1.0000 | MEDICATED_PATCH | CUTANEOUS | Status: DC
  Administered 2017-07-16: 1 via TRANSDERMAL
  Filled 2017-07-16: qty 1

## 2017-07-16 NOTE — ED Triage Notes (Signed)
First Nurse Note:  C/O having history of pinched nerve to lower back.  States this morning felt low back pain and left leg gave out on him and now he has swelling to low back as well.  AAOx3.  Skin warm and dry. NAD

## 2017-07-16 NOTE — ED Triage Notes (Signed)
Low back pain radiating to L leg. History of "pinched nerve" causing pain down L leg however worse since tripped and fell this am.

## 2017-07-16 NOTE — ED Provider Notes (Signed)
[facility] Emergency Department Provider Note  ____________________________________________  Time seen: Approximately 10:54 AM  I have reviewed the triage vital signs and the nursing notes.   HISTORY  Chief Complaint Back Pain    HPI Trevor Nunez is a 52 y.o. male that presents the emergency department for evaluation of back pain and right elbow pain after fall this morning.  Patient states that he has chronic back pain and is currently seeing his primary care and a specialist for symptoms.  This morning his back "gave out " on him and he fell.  He is having pain when he tries to straighten his elbow.  Pain starts in low back and radiates down the side of his left thigh.  No bowel or bladder dysfunction or saddle paresthesias.  He denies nausea, vomiting, abdominal pain, numbness, tingling.  Past Medical History:  Diagnosis Date  . Chest pain    a. In setting of HTN Urgency w/ trop elevation-->05/2016 MV: no ischemia-->low risk.  . Chronic diastolic CHF (congestive heart failure) (HCC)    a. 10/2015 Echo: EF 55-60%, no rwma; b. 05/2016 Echo: EF 65-70%, sev LVH, Gr2 DD, triv AI, mildly dil LA, nl RV size/fxn, mildly-mod dil RA.  . CKD (chronic kidney disease), stage III (HCC)   . Dilated aortic root (HCC)    a. 10/2015 Echo: Ao root 41mm;  b. 05/2016 Echo: triv AI.  Marland Kitchen. Elevated troponin    a. 10/2015, 05/2016, & 10/2016 in the setting of HTN Urgency;  b. 05/2016 MV: no ischemia-->Low risk.  . H/O medication noncompliance   . Hypertension     Patient Active Problem List   Diagnosis Date Noted  . Foot pain, right 05/07/2017  . Leg pain 01/27/2017  . Obesity 11/16/2016  . HTN (hypertension) 11/08/2016  . Chronic diastolic heart failure (HCC) 06/25/2016  . Snoring 06/25/2016  . CKD (chronic kidney disease), stage III (HCC) 06/15/2016  . Demand ischemia (HCC) 06/15/2016  . Pain and swelling of elbow   . Acute on chronic diastolic CHF (congestive heart failure) (HCC)    . H/O medication noncompliance   . Chest pain with moderate risk for cardiac etiology 10/17/2015  . Malignant hypertension 10/17/2015  . AKI (acute kidney injury) (HCC) 10/17/2015    Past Surgical History:  Procedure Laterality Date  . CHOLECYSTECTOMY    . HERNIA REPAIR      Prior to Admission medications   Medication Sig Start Date End Date Taking? Authorizing Provider  allopurinol (ZYLOPRIM) 100 MG tablet Take 1 tablet (100 mg total) by mouth daily. 12/16/16   Iran OuchArida, Muhammad A, MD  aspirin 81 MG chewable tablet Chew 1 tablet (81 mg total) by mouth daily. 01/31/17   Creig HinesBerge, Christopher Ronald, NP  carvedilol (COREG) 12.5 MG tablet Take 12.5 mg by mouth 2 (two) times daily with a meal.    [provider]  cyclobenzaprine (FLEXERIL) 5 MG tablet Take 1-2 tablets 3 times daily as needed 07/16/17   Enid DerryWagner, Malie Kashani, PA-C  hydrALAZINE (APRESOLINE) 50 MG tablet Take 50 mg by mouth 3 (three) times daily.    [provider]  lidocaine (LIDODERM) 5 % Place 1 patch onto the skin every 12 (twelve) hours. Remove & Discard patch within 12 hours or as directed by MD 07/16/17 07/16/18  Enid DerryWagner, Orie Cuttino, PA-C  metolazone (ZAROXOLYN) 2.5 MG tablet Take 1 tablet (2.5 mg total) by mouth daily. 05/06/17 08/04/17  Delma FreezeHackney, Tina A, FNP  nitroGLYCERIN (NITROSTAT) 0.4 MG SL tablet Place 1 tablet (  0.4 mg total) under the tongue every 5 (five) minutes as needed for chest pain. 11/18/16   Enid Baas, MD  oxyCODONE-acetaminophen (PERCOCET) 5-325 MG tablet Take 1 tablet by mouth every 4 (four) hours as needed for severe pain. 07/16/17 07/16/18  Enid Derry, PA-C  potassium chloride SA (K-DUR,KLOR-CON) 20 MEQ tablet Take 1 tablet (20 mEq total) by mouth 2 (two) times daily. And take extra when taking metolazone 05/06/17   Clarisa Kindred A, FNP  spironolactone (ALDACTONE) 25 MG tablet Take 25 mg by mouth daily.    [provider]  torsemide (DEMADEX) 20 MG tablet Take 60 mg by mouth 2 (two) times  daily.    [provider]    Allergies Codeine  Family History  Problem Relation Age of Onset  . Hypertension Mother   . Heart attack Mother   . CVA Mother   . Heart disease Sister     Social History Social History   Tobacco Use  . Smoking status: Former Smoker    Last attempt to quit: 08/17/2015    Years since quitting: 1.9  . Smokeless tobacco: Never Used  Substance Use Topics  . Alcohol use: No    Alcohol/week: 0.0 oz  . Drug use: No     Review of Systems  Cardiovascular: No chest pain. Respiratory: No SOB. Gastrointestinal: No abdominal pain.  No nausea, no vomiting.  Musculoskeletal: Positive for elbow and back pain. Skin: Negative for rash, abrasions, lacerations, ecchymosis.   ____________________________________________   PHYSICAL EXAM:  VITAL SIGNS: ED Triage Vitals [07/16/17 1024]  Enc Vitals Group     BP (!) 138/109     Pulse Rate 98     Resp 20     Temp 98.5 F (36.9 C)     Temp Source Oral     SpO2 98 %     Weight 280 lb (127 kg)     Height 6' (1.829 m)     Head Circumference      Peak Flow      Pain Score 10     Pain Loc      Pain Edu?      Excl. in GC?      Constitutional: Alert and oriented. Well appearing and in no acute distress. Eyes: Conjunctivae are normal. PERRL. EOMI. Head: Atraumatic. ENT:      Ears:      Nose: No congestion/rhinnorhea.      Mouth/Throat: Mucous membranes are moist.  Neck: No stridor.  Cardiovascular: Normal rate, regular rhythm.  Good peripheral circulation.  Symmetric radial pulses bilaterally. Respiratory: Normal respiratory effort without tachypnea or retractions. Lungs CTAB. Good air entry to the bases with no decreased or absent breath sounds. Musculoskeletal: Full range of motion to all extremities. No gross deformities appreciated.  Pain with extension of right elbow.  Patient is pushing himself up with his arms in the chair. No pinpoint tenderness to palpation over lumbar spine.  Left  lumbar paraspinal tenderness to palpation. Positive straight leg rase.   Antalgic gait. Neurologic:  Normal speech and language. No gross focal neurologic deficits are appreciated.  Skin:  Skin is warm, dry and intact. No rash noted.   ____________________________________________   LABS (all labs ordered are listed, but only abnormal results are displayed)  Labs Reviewed - No data to display ____________________________________________  EKG   ____________________________________________  RADIOLOGY Lexine Baton, personally viewed and evaluated these images (plain radiographs) as part of my medical decision making, as well as reviewing  the written report by the radiologist.  Dg Lumbar Spine Complete  Result Date: 07/16/2017 CLINICAL DATA:  Acute low back pain following fall. Initial encounter. EXAM: LUMBAR SPINE - COMPLETE 4+ VIEW COMPARISON:  None. FINDINGS: There is no evidence of acute fracture subluxation. Multilevel lumbar spondylosis noted. Mild to moderate facet arthropathy in the LOWER lumbar spine identified. No focal bony lesions or spondylolysis noted. IMPRESSION: No acute abnormality.  Degenerative changes as discussed. Electronically Signed   By: Harmon Pier M.D.   On: 07/16/2017 11:51   Dg Elbow Complete Right  Result Date: 07/16/2017 CLINICAL DATA:  Acute RIGHT elbow pain following fall today. Initial encounter. EXAM: RIGHT ELBOW - COMPLETE 3+ VIEW COMPARISON:  10/18/2015 FINDINGS: An elbow effusion is noted. No discrete fracture is identified. No subluxation or dislocation. Olecranon spur is present. IMPRESSION: Elbow effusion without discrete fracture identified. An occult fracture is not excluded. Electronically Signed   By: Harmon Pier M.D.   On: 07/16/2017 11:49    ____________________________________________    PROCEDURES  Procedure(s) performed:    Procedures    Medications  lidocaine (LIDODERM) 5 % 1 patch (1 patch Transdermal Patch Applied 07/16/17  1326)  oxyCODONE-acetaminophen (PERCOCET/ROXICET) 5-325 MG per tablet 1 tablet (1 tablet Oral Given 07/16/17 1133)  ketorolac (TORADOL) 30 MG/ML injection 30 mg (30 mg Intramuscular Given 07/16/17 1217)  cyclobenzaprine (FLEXERIL) tablet 10 mg (10 mg Oral Given 07/16/17 1217)  oxyCODONE-acetaminophen (PERCOCET/ROXICET) 5-325 MG per tablet 1 tablet (1 tablet Oral Given 07/16/17 1326)     ____________________________________________   INITIAL IMPRESSION / ASSESSMENT AND PLAN / ED COURSE  Pertinent labs & imaging results that were available during my care of the patient were reviewed by me and considered in my medical decision making (see chart for details).  Review of the [place] CSRS was performed in accordance of the [facility] prior to dispensing any controlled drugs.   Patient's diagnosis is consistent with elbow effusion and low back pain.  Vital signs and exam are reassuring.  Elbow x-ray consistent with effusion and cannot rule out occult fracture per radiology. Splint was placed and sling was given.  Lumbar x-ray negative for acute abnormalities.  Patient does not have pinpoint lumbar tenderness.  He is able to get up and walk around the room.  Percocet and Flexeril improved symptoms but patient states that the Lidoderm patch really helped.  Patient will be discharged home with prescriptions for oxycodone, Flexeril, Lidoderm. Patient is to follow up with orthopedics and PCP as directed. Patient is given ED precautions to return to the ED for any worsening or new symptoms.     ____________________________________________  FINAL CLINICAL IMPRESSION(S) / ED DIAGNOSES  Final diagnoses:  Chronic left-sided low back pain without sciatica  Effusion of right elbow      NEW MEDICATIONS STARTED DURING THIS VISIT:  ED Discharge Orders        Ordered    oxyCODONE-acetaminophen (PERCOCET) 5-325 MG tablet  Every 4 hours PRN     07/16/17 1403    cyclobenzaprine (FLEXERIL) 5 MG tablet     07/16/17  1403    lidocaine (LIDODERM) 5 %  Every 12 hours     07/16/17 1403          This chart was dictated using voice recognition software/Dragon. Despite best efforts to proofread, errors can occur which can change the meaning. Any change was purely unintentional.    Enid Derry, PA-C 07/16/17 1547    Sharyn Creamer, MD 07/16/17 [phone]

## 2017-07-20 ENCOUNTER — Other Ambulatory Visit: Payer: Self-pay

## 2017-07-20 ENCOUNTER — Emergency Department (HOSPITAL_COMMUNITY): Payer: Medicaid Other

## 2017-07-20 ENCOUNTER — Encounter (HOSPITAL_COMMUNITY): Payer: Self-pay | Admitting: Emergency Medicine

## 2017-07-20 ENCOUNTER — Inpatient Hospital Stay (HOSPITAL_COMMUNITY): Payer: Medicaid Other

## 2017-07-20 ENCOUNTER — Inpatient Hospital Stay (HOSPITAL_COMMUNITY)
Admission: EM | Admit: 2017-07-20 | Discharge: 2017-07-27 | DRG: 556 | Disposition: A | Discharge status: Rehabilitation Facility | Payer: Medicaid Other | Attending: Family Medicine | Admitting: Family Medicine

## 2017-07-20 DIAGNOSIS — G4733 Obstructive sleep apnea (adult) (pediatric): Secondary | ICD-10-CM

## 2017-07-20 DIAGNOSIS — M6258 Muscle wasting and atrophy, not elsewhere classified, other site: Secondary | ICD-10-CM | POA: Diagnosis present

## 2017-07-20 DIAGNOSIS — N189 Chronic kidney disease, unspecified: Secondary | ICD-10-CM | POA: Diagnosis not present

## 2017-07-20 DIAGNOSIS — R5381 Other malaise: Secondary | ICD-10-CM | POA: Diagnosis not present

## 2017-07-20 DIAGNOSIS — I5032 Chronic diastolic (congestive) heart failure: Secondary | ICD-10-CM | POA: Diagnosis not present

## 2017-07-20 DIAGNOSIS — M79604 Pain in right leg: Secondary | ICD-10-CM | POA: Diagnosis present

## 2017-07-20 DIAGNOSIS — Z6841 Body Mass Index (BMI) 40.0 and over, adult: Secondary | ICD-10-CM

## 2017-07-20 DIAGNOSIS — N183 Chronic kidney disease, stage 3 unspecified: Secondary | ICD-10-CM

## 2017-07-20 DIAGNOSIS — M48061 Spinal stenosis, lumbar region without neurogenic claudication: Secondary | ICD-10-CM | POA: Diagnosis present

## 2017-07-20 DIAGNOSIS — R269 Unspecified abnormalities of gait and mobility: Secondary | ICD-10-CM | POA: Diagnosis not present

## 2017-07-20 DIAGNOSIS — R2689 Other abnormalities of gait and mobility: Secondary | ICD-10-CM | POA: Diagnosis not present

## 2017-07-20 DIAGNOSIS — R296 Repeated falls: Secondary | ICD-10-CM | POA: Diagnosis present

## 2017-07-20 DIAGNOSIS — R9431 Abnormal electrocardiogram [ECG] [EKG]: Secondary | ICD-10-CM | POA: Diagnosis not present

## 2017-07-20 DIAGNOSIS — M4807 Spinal stenosis, lumbosacral region: Secondary | ICD-10-CM | POA: Diagnosis present

## 2017-07-20 DIAGNOSIS — M792 Neuralgia and neuritis, unspecified: Secondary | ICD-10-CM

## 2017-07-20 DIAGNOSIS — M659 Synovitis and tenosynovitis, unspecified: Secondary | ICD-10-CM | POA: Diagnosis present

## 2017-07-20 DIAGNOSIS — M109 Gout, unspecified: Secondary | ICD-10-CM | POA: Diagnosis present

## 2017-07-20 DIAGNOSIS — I471 Supraventricular tachycardia: Secondary | ICD-10-CM | POA: Diagnosis not present

## 2017-07-20 DIAGNOSIS — R202 Paresthesia of skin: Secondary | ICD-10-CM | POA: Diagnosis not present

## 2017-07-20 DIAGNOSIS — M10061 Idiopathic gout, right knee: Secondary | ICD-10-CM | POA: Diagnosis not present

## 2017-07-20 DIAGNOSIS — R52 Pain, unspecified: Secondary | ICD-10-CM | POA: Diagnosis not present

## 2017-07-20 DIAGNOSIS — I130 Hypertensive heart and chronic kidney disease with heart failure and stage 1 through stage 4 chronic kidney disease, or unspecified chronic kidney disease: Secondary | ICD-10-CM | POA: Diagnosis present

## 2017-07-20 DIAGNOSIS — R29898 Other symptoms and signs involving the musculoskeletal system: Secondary | ICD-10-CM | POA: Diagnosis not present

## 2017-07-20 DIAGNOSIS — N184 Chronic kidney disease, stage 4 (severe): Secondary | ICD-10-CM | POA: Diagnosis not present

## 2017-07-20 DIAGNOSIS — E876 Hypokalemia: Secondary | ICD-10-CM | POA: Diagnosis present

## 2017-07-20 DIAGNOSIS — I10 Essential (primary) hypertension: Secondary | ICD-10-CM | POA: Diagnosis not present

## 2017-07-20 DIAGNOSIS — Z9119 Patient's noncompliance with other medical treatment and regimen: Secondary | ICD-10-CM | POA: Diagnosis not present

## 2017-07-20 DIAGNOSIS — R740 Nonspecific elevation of levels of transaminase and lactic acid dehydrogenase [LDH]: Secondary | ICD-10-CM | POA: Diagnosis not present

## 2017-07-20 DIAGNOSIS — M6281 Muscle weakness (generalized): Principal | ICD-10-CM | POA: Diagnosis present

## 2017-07-20 DIAGNOSIS — R7303 Prediabetes: Secondary | ICD-10-CM | POA: Diagnosis not present

## 2017-07-20 DIAGNOSIS — M79605 Pain in left leg: Secondary | ICD-10-CM | POA: Diagnosis present

## 2017-07-20 DIAGNOSIS — M1000 Idiopathic gout, unspecified site: Secondary | ICD-10-CM | POA: Diagnosis not present

## 2017-07-20 DIAGNOSIS — M25529 Pain in unspecified elbow: Secondary | ICD-10-CM

## 2017-07-20 DIAGNOSIS — R262 Difficulty in walking, not elsewhere classified: Secondary | ICD-10-CM | POA: Diagnosis present

## 2017-07-20 DIAGNOSIS — M47817 Spondylosis without myelopathy or radiculopathy, lumbosacral region: Secondary | ICD-10-CM | POA: Diagnosis present

## 2017-07-20 DIAGNOSIS — Z8249 Family history of ischemic heart disease and other diseases of the circulatory system: Secondary | ICD-10-CM

## 2017-07-20 DIAGNOSIS — R0602 Shortness of breath: Secondary | ICD-10-CM

## 2017-07-20 DIAGNOSIS — M47816 Spondylosis without myelopathy or radiculopathy, lumbar region: Secondary | ICD-10-CM | POA: Diagnosis present

## 2017-07-20 DIAGNOSIS — R739 Hyperglycemia, unspecified: Secondary | ICD-10-CM | POA: Diagnosis not present

## 2017-07-20 DIAGNOSIS — M25421 Effusion, right elbow: Secondary | ICD-10-CM | POA: Diagnosis present

## 2017-07-20 DIAGNOSIS — I5030 Unspecified diastolic (congestive) heart failure: Secondary | ICD-10-CM | POA: Diagnosis present

## 2017-07-20 DIAGNOSIS — M4802 Spinal stenosis, cervical region: Secondary | ICD-10-CM | POA: Diagnosis present

## 2017-07-20 DIAGNOSIS — F419 Anxiety disorder, unspecified: Secondary | ICD-10-CM | POA: Diagnosis not present

## 2017-07-20 DIAGNOSIS — R200 Anesthesia of skin: Secondary | ICD-10-CM

## 2017-07-20 DIAGNOSIS — Z823 Family history of stroke: Secondary | ICD-10-CM

## 2017-07-20 DIAGNOSIS — M25521 Pain in right elbow: Secondary | ICD-10-CM | POA: Diagnosis not present

## 2017-07-20 DIAGNOSIS — D72829 Elevated white blood cell count, unspecified: Secondary | ICD-10-CM | POA: Diagnosis not present

## 2017-07-20 DIAGNOSIS — M7989 Other specified soft tissue disorders: Secondary | ICD-10-CM | POA: Diagnosis not present

## 2017-07-20 DIAGNOSIS — Z91199 Patient's noncompliance with other medical treatment and regimen due to unspecified reason: Secondary | ICD-10-CM

## 2017-07-20 DIAGNOSIS — M79609 Pain in unspecified limb: Secondary | ICD-10-CM | POA: Diagnosis not present

## 2017-07-20 DIAGNOSIS — Z9114 Patient's other noncompliance with medication regimen: Secondary | ICD-10-CM | POA: Diagnosis not present

## 2017-07-20 DIAGNOSIS — N179 Acute kidney failure, unspecified: Secondary | ICD-10-CM | POA: Diagnosis present

## 2017-07-20 LAB — HEMOGLOBIN A1C
HEMOGLOBIN A1C: 6.2 % — AB (ref 4.8–5.6)
Mean Plasma Glucose: 131.24 mg/dL

## 2017-07-20 LAB — BASIC METABOLIC PANEL
ANION GAP: 14 (ref 5–15)
BUN: 23 mg/dL — ABNORMAL HIGH (ref 6–20)
CHLORIDE: 98 mmol/L — AB (ref 101–111)
CO2: 23 mmol/L (ref 22–32)
Calcium: 8.9 mg/dL (ref 8.9–10.3)
Creatinine, Ser: 2.19 mg/dL — ABNORMAL HIGH (ref 0.61–1.24)
GFR calc non Af Amer: 33 mL/min — ABNORMAL LOW (ref >60)
GFR, EST AFRICAN AMERICAN: 38 mL/min — AB (ref >60)
GLUCOSE: 123 mg/dL — AB (ref 65–99)
Potassium: 3.4 mmol/L — ABNORMAL LOW (ref 3.5–5.1)
Sodium: 135 mmol/L (ref 135–145)

## 2017-07-20 LAB — URINALYSIS, ROUTINE W REFLEX MICROSCOPIC
Bilirubin Urine: NEGATIVE
Glucose, UA: NEGATIVE mg/dL
Hgb urine dipstick: NEGATIVE
Ketones, ur: NEGATIVE mg/dL
LEUKOCYTES UA: NEGATIVE
NITRITE: NEGATIVE
Protein, ur: 100 mg/dL — AB
SPECIFIC GRAVITY, URINE: 1.02 (ref 1.005–1.030)
Squamous Epithelial / LPF: NONE SEEN
pH: 5 (ref 5.0–8.0)

## 2017-07-20 LAB — I-STAT TROPONIN, ED: Troponin i, poc: 0.09 ng/mL (ref 0.00–0.08)

## 2017-07-20 LAB — CBC WITH DIFFERENTIAL/PLATELET
Basophils Absolute: 0 10*3/uL (ref 0.0–0.1)
Basophils Relative: 0 %
Eosinophils Absolute: 0.1 10*3/uL (ref 0.0–0.7)
Eosinophils Relative: 1 %
HEMATOCRIT: 41.4 % (ref 39.0–52.0)
HEMOGLOBIN: 13.8 g/dL (ref 13.0–17.0)
LYMPHS ABS: 1.9 10*3/uL (ref 0.7–4.0)
LYMPHS PCT: 19 %
MCH: 29.7 pg (ref 26.0–34.0)
MCHC: 33.3 g/dL (ref 30.0–36.0)
MCV: 89.2 fL (ref 78.0–100.0)
MONOS PCT: 9 %
Monocytes Absolute: 0.9 10*3/uL (ref 0.1–1.0)
NEUTROS ABS: 7.3 10*3/uL (ref 1.7–7.7)
Neutrophils Relative %: 71 %
Platelets: 308 10*3/uL (ref 150–400)
RBC: 4.64 MIL/uL (ref 4.22–5.81)
RDW: 13.1 % (ref 11.5–15.5)
WBC: 10.2 10*3/uL (ref 4.0–10.5)

## 2017-07-20 LAB — BRAIN NATRIURETIC PEPTIDE: B Natriuretic Peptide: 67.3 pg/mL (ref 0.0–100.0)

## 2017-07-20 MED ORDER — FUROSEMIDE 10 MG/ML IJ SOLN: 80.0000 mg | Freq: Once | INTRAMUSCULAR | Status: DC

## 2017-07-20 MED ORDER — HYDRALAZINE HCL 25 MG PO TABS
50.0000 mg | ORAL_TABLET | Freq: Three times a day (TID) | ORAL | Status: DC
  Administered 2017-07-20 – 2017-07-24 (×11): 50 mg via ORAL
  Filled 2017-07-20 (×12): qty 2

## 2017-07-20 MED ORDER — FUROSEMIDE 10 MG/ML IJ SOLN
80.0000 mg | Freq: Once | INTRAMUSCULAR | Status: AC
  Administered 2017-07-20: 80 mg via INTRAVENOUS
  Filled 2017-07-20: qty 8

## 2017-07-20 MED ORDER — GABAPENTIN 600 MG PO TABS: 300.0000 mg | ORAL_TABLET | Freq: Two times a day (BID) | ORAL | Status: DC

## 2017-07-20 MED ORDER — CARVEDILOL 12.5 MG PO TABS
12.5000 mg | ORAL_TABLET | Freq: Two times a day (BID) | ORAL | Status: DC
  Administered 2017-07-21 – 2017-07-27 (×12): 12.5 mg via ORAL
  Filled 2017-07-20 (×13): qty 1

## 2017-07-20 MED ORDER — GABAPENTIN 100 MG PO CAPS
200.0000 mg | ORAL_CAPSULE | Freq: Three times a day (TID) | ORAL | Status: DC
  Administered 2017-07-20 – 2017-07-24 (×11): 200 mg via ORAL
  Filled 2017-07-20 (×11): qty 2

## 2017-07-20 MED ORDER — ASPIRIN 81 MG PO CHEW
81.0000 mg | CHEWABLE_TABLET | Freq: Every day | ORAL | Status: DC
  Administered 2017-07-21 – 2017-07-27 (×7): 81 mg via ORAL
  Filled 2017-07-20 (×7): qty 1

## 2017-07-20 MED ORDER — POTASSIUM CHLORIDE CRYS ER 20 MEQ PO TBCR
60.0000 meq | EXTENDED_RELEASE_TABLET | Freq: Once | ORAL | Status: AC
  Administered 2017-07-20: 60 meq via ORAL
  Filled 2017-07-20: qty 3

## 2017-07-20 MED ORDER — ACETAMINOPHEN 650 MG RE SUPP: 650.0000 mg | Freq: Four times a day (QID) | RECTAL | Status: DC | PRN

## 2017-07-20 MED ORDER — ENOXAPARIN SODIUM 40 MG/0.4ML ~~LOC~~ SOLN
40.0000 mg | SUBCUTANEOUS | Status: DC
  Administered 2017-07-20 – 2017-07-21 (×2): 40 mg via SUBCUTANEOUS
  Filled 2017-07-20 (×2): qty 0.4

## 2017-07-20 MED ORDER — TORSEMIDE 20 MG PO TABS: 60.0000 mg | ORAL_TABLET | Freq: Three times a day (TID) | ORAL | Status: DC

## 2017-07-20 MED ORDER — TORSEMIDE 20 MG PO TABS
60.0000 mg | ORAL_TABLET | Freq: Three times a day (TID) | ORAL | Status: DC
  Administered 2017-07-20 – 2017-07-26 (×17): 60 mg via ORAL
  Filled 2017-07-20 (×17): qty 3

## 2017-07-20 MED ORDER — ACETAMINOPHEN 325 MG PO TABS
650.0000 mg | ORAL_TABLET | Freq: Four times a day (QID) | ORAL | Status: DC | PRN
  Administered 2017-07-20 – 2017-07-27 (×14): 650 mg via ORAL
  Filled 2017-07-20 (×14): qty 2

## 2017-07-20 MED ORDER — SPIRONOLACTONE 25 MG PO TABS
25.0000 mg | ORAL_TABLET | Freq: Every day | ORAL | Status: DC
  Administered 2017-07-21 – 2017-07-27 (×7): 25 mg via ORAL
  Filled 2017-07-20 (×7): qty 1

## 2017-07-20 MED ORDER — OXYCODONE-ACETAMINOPHEN 5-325 MG PO TABS
2.0000 | ORAL_TABLET | Freq: Once | ORAL | Status: AC
  Administered 2017-07-20: 2 via ORAL
  Filled 2017-07-20: qty 2

## 2017-07-20 NOTE — ED Notes (Addendum)
Patient transported to MRI 

## 2017-07-20 NOTE — ED Notes (Signed)
Pt taken to MRI. Will be transported to [facility] after completion of MRI

## 2017-07-20 NOTE — ED Notes (Signed)
Hospitalist bedside for evaluation 

## 2017-07-20 NOTE — H&P (Addendum)
Family Medicine Teaching [facility] Admission History and Physical Service Pager: [phone]  Patient name: Trevor Nunez Medical record number: [id number] Date of birth: [birth year] Age: 52 y.o. Gender: male  Primary Care Provider: Barbette Reichmann, MD Consultants: None Code Status: Full   Chief Complaint: Bilateral LE weakness and pain  Assessment and Plan: Trevor Nunez is a 52 y.o. male presenting with bilateral lower extremity weakness/pain. PMH is significant for HTN, CKD, HFpEF, morbid obesity.  Bilateral LE weakness and pain:  Began about two weeks ago, but was exacerbated by a fall on Thursday, March 28.  Accompanied by worsening lower extremity edema, abdominal distention, and increased shortness of breath.  Patient reports have a pinched nerve in his left lumbar region that his been a chronic problem, but he has never experienced immobility like this due to weakness and pain.  No bowel or bladder incontinence.  Patient believes that his weakness is neurological rather than limited effort due to pain; however, he was extremely sensitive to palpation and had significant pedal edema.  Patient able to move legs against gravity, but movement was minimal and painful.  MRI lumbar spine significant for foraminal stenosis in lumbosacral region and spondylosis at L4-L5 and L5-S1.  No acute process visualized on MRI.  CBC wnl.  Most likely a combination of nerve pain from lumbar foraminal stenosis and pain from pedal edema.  Recently evaluated by neurology whom have already performed a good portion of the aforementioned work up previously  for left lateral upper thigh pain and hypesthesia  with a diagnosis of lateral femoral cutaneous nerve entrapment and a prescription of amitriptyline along with gabapentin -neither of which have helped per patient. Less likely [employer] given sensation, stroke, bone metastasis, Vitamin B12 deficiency, neurosyphilis, hypothyroidism, muscle myopathy  - Admit to  [facility], telemetry  - MRI brain - PT/OT - TSH - RPR - Vitamin B12 level - Hgb A1c - HIV - ABI's  - gabapentin 200 mg TID - previously on 100 mg TID outpatient without much relief - UDS - CK - LE Dopplers  -Could consider to neurology pending work up  HFpEF: ECHO EF 60-65%, mild reg, G1DD.  Last echo in August 2018.  BNP 67.3 at admission, thought could be inaccurate due to weight, however previous exacerbations with BNPs in the 200s.  Takes torsemide 60 mg TID at home.  Troponin 0.09 at admission (similar to previous trends). EKG with LAF block (noted on previous EKG) and supraventricular bigeminy found to be new. CXR without signs of edema.  Appears volume overloaded on exam, slight bump in the SCr could be due to increase in volume status, however, BNP and CXR did not support a volume overload picture.  - Will trial lasix IV 60 mg x 1 - if further bump in SCr less likely volume overload vs venous stasis   - repeat echo - Continue home torsemide dose - Kdur 60 mEq once - strict I's and O's - daily weights - am EKG - trend troponins  AoCKD: Baseline 1.8-2.0, SCr 2.19 at admission.  Patient's caregiver reported dark urine during the past week.  UA notable for protein 100, hyaline, granular. and amorphous crystal casts.  - daily BMP - replenish K as needed  HTN: Hypertensive to 160/95 on admission.  Home regimen includes Coreg 12.5 mg BID, hydralazine 50 mg TID, Torsemide 60 mg TID. - Continue coreg and hydralazine  - continue home medications (will start torsemide on 4/4)  R arm injury s/p fall: Currently in  sugar tong cast.  Patient says he was told that it is possibly that he had a fracture though none noted on initial ED xrays, would need follow up with orthopedics. Appointment suppose to be on 4/4. -  Will need to follow up with orthopedics outpatient   OSA: Recently diagnosis of OSA in February, still waiting on a CPAP machine at home  - CPAP at night   FEN/GI: heart  healthy diet Prophylaxis: lovenox  Disposition: admit to telemetry, attending Dr. Pollie Meyer  History of Present Illness:  Trevor Nunez is a 52 y.o. male presenting with bilateral leg weakness and pain since Thursday, March 28.  He started to feel a worsening of his pinched nerve about two weeks ago.  He saw his neurologist on Wednesday, March 27, who prescribed gabapentin and another medication, which he cannot name.  Then, he fell while trying to stand up to go to the bathroom on Thursday and caught himself with his right arm, causing him to injure that arm.  After his fall, he could no longer walk due to weakness and pain.  He was seen in the ED on Friday for his arm, but he says that a diagnosis was not explicitly made; however, he was referred to orthopedics and told to address his leg pain with them.  He says that his leg swelling has increased over the past three days, and his abdominal girth has also increased.  He also has needed to prop his head up during the night because he was recently diagnosed with sleep apnea.  He believes his lack of mobility is due to weakness, but he says he has excruciating pain in his bilateral legs and feet.  He has been in poor health due to his heart failure for multiple years, but he has never experienced anything like this before.  Review Of Systems: Per HPI with the following additions:  Review of Systems  Constitutional: Positive for chills.  Eyes: Negative for blurred vision and double vision.  Respiratory: Negative for cough and shortness of breath.   Cardiovascular: Positive for orthopnea and leg swelling. Negative for chest pain and palpitations.  Gastrointestinal: Negative for abdominal pain, constipation, diarrhea, nausea and vomiting.  Genitourinary: Negative for dysuria.  Musculoskeletal: Positive for falls.  Neurological: Positive for weakness. Negative for dizziness, tingling, sensory change, speech change, focal weakness, loss of consciousness and  headaches.    Patient Active Problem List   Diagnosis Date Noted  . Bilateral leg weakness 07/20/2017  . Foot pain, right 05/07/2017  . Leg pain 01/27/2017  . Obesity 11/16/2016  . HTN (hypertension) 11/08/2016  . Chronic diastolic heart failure (HCC) 06/25/2016  . Snoring 06/25/2016  . CKD (chronic kidney disease), stage III (HCC) 06/15/2016  . Demand ischemia (HCC) 06/15/2016  . Pain and swelling of elbow   . Acute on chronic diastolic CHF (congestive heart failure) (HCC)   . H/O medication noncompliance   . Chest pain with moderate risk for cardiac etiology 10/17/2015  . Malignant hypertension 10/17/2015  . AKI (acute kidney injury) (HCC) 10/17/2015    Past Medical History: Past Medical History:  Diagnosis Date  . Chest pain    a. In setting of HTN Urgency w/ trop elevation-->05/2016 MV: no ischemia-->low risk.  . Chronic diastolic CHF (congestive heart failure) (HCC)    a. 10/2015 Echo: EF 55-60%, no rwma; b. 05/2016 Echo: EF 65-70%, sev LVH, Gr2 DD, triv AI, mildly dil LA, nl RV size/fxn, mildly-mod dil RA.  . CKD (chronic kidney  disease), stage III (HCC)   . Dilated aortic root (HCC)    a. 10/2015 Echo: Ao root 41mm;  b. 05/2016 Echo: triv AI.  Marland Kitchen Elevated troponin    a. 10/2015, 05/2016, & 10/2016 in the setting of HTN Urgency;  b. 05/2016 MV: no ischemia-->Low risk.  . H/O medication noncompliance   . Hypertension     Past Surgical History: Past Surgical History:  Procedure Laterality Date  . CHOLECYSTECTOMY    . HERNIA REPAIR      Social History: Social History   Tobacco Use  . Smoking status: Former Smoker    Last attempt to quit: 08/17/2015    Years since quitting: 1.9  . Smokeless tobacco: Never Used  Substance Use Topics  . Alcohol use: No    Alcohol/week: 0.0 oz  . Drug use: No   Additional social history:   Please also refer to relevant sections of EMR.  Family History: Family History  Problem Relation Age of Onset  . Hypertension Mother   . Heart  attack Mother   . CVA Mother   . Heart disease Sister    (If not completed, MUST add something in)  Allergies and Medications: Allergies  Allergen Reactions  . Codeine Palpitations   No current facility-administered medications on file prior to encounter.    Current Outpatient Medications on File Prior to Encounter  Medication Sig Dispense Refill  . aspirin 81 MG chewable tablet Chew 1 tablet (81 mg total) by mouth daily. 90 tablet 3  . carvedilol (COREG) 12.5 MG tablet Take 12.5 mg by mouth 2 (two) times daily with a meal.    . hydrALAZINE (APRESOLINE) 50 MG tablet Take 50 mg by mouth 3 (three) times daily.    . potassium chloride SA (K-DUR,KLOR-CON) 20 MEQ tablet Take 1 tablet (20 mEq total) by mouth 2 (two) times daily. And take extra when taking metolazone 180 tablet 3  . spironolactone (ALDACTONE) 25 MG tablet Take 25 mg by mouth daily.    Marland Kitchen torsemide (DEMADEX) 20 MG tablet Take 60 mg by mouth 3 (three) times daily.     Marland Kitchen allopurinol (ZYLOPRIM) 100 MG tablet Take 1 tablet (100 mg total) by mouth daily. (Patient not taking: Reported on 07/20/2017) 30 tablet 0  . cyclobenzaprine (FLEXERIL) 5 MG tablet Take 1-2 tablets 3 times daily as needed (Patient not taking: Reported on 07/20/2017) 20 tablet 0  . lidocaine (LIDODERM) 5 % Place 1 patch onto the skin every 12 (twelve) hours. Remove & Discard patch within 12 hours or as directed by MD (Patient not taking: Reported on 07/20/2017) 10 patch 0  . metolazone (ZAROXOLYN) 2.5 MG tablet Take 1 tablet (2.5 mg total) by mouth daily. (Patient not taking: Reported on 07/20/2017) 3 tablet 0  . nitroGLYCERIN (NITROSTAT) 0.4 MG SL tablet Place 1 tablet (0.4 mg total) under the tongue every 5 (five) minutes as needed for chest pain. 20 tablet 1  . oxyCODONE-acetaminophen (PERCOCET) 5-325 MG tablet Take 1 tablet by mouth every 4 (four) hours as needed for severe pain. 6 tablet 0    Objective: BP (!) 160/95   Pulse 99   Temp 100.3 F (37.9 C) (Oral)    Resp (!) 23   SpO2 92%  Physical Exam  Constitutional: He is oriented to person, place, and time. He appears well-developed and well-nourished. No distress.  HENT:  Head: Normocephalic and atraumatic.  Eyes: Conjunctivae and EOM are normal.  Neck: Normal range of motion.  Cardiovascular: Normal rate and  intact distal pulses.  No murmur heard. Irregular rhythm  Pulmonary/Chest: Effort normal and breath sounds normal. No respiratory distress. He has no wheezes.  Abdominal: Bowel sounds are normal. He exhibits distension. There is no tenderness.  Musculoskeletal: Normal range of motion. He exhibits edema and tenderness. He exhibits no deformity.  Sugar tong cast on RUE  Neurological: He is alert and oriented to person, place, and time. No cranial nerve deficit.  L5-S1 intact, able to move big toe to resistance, able to lift legs off of the bed - though not able to move to resistance due to pain (3/5 lower extremity strength), extremely painful to the touch, DP 2+ on right foot, unable to detect on left foot, 5/5 upper extremity strength. bilateral lower extremity edema 2+ to knee   Skin: Skin is warm and dry. Capillary refill takes less than 2 seconds.  Psychiatric: He has a normal mood and affect. His behavior is normal.     Labs and Imaging: CBC BMET  Recent Labs  Lab 07/20/17 1800  WBC 10.2  HGB 13.8  HCT 41.4  PLT 308   Recent Labs  Lab 07/20/17 1800  NA 135  K 3.4*  CL 98*  CO2 23  BUN 23*  CREATININE 2.19*  GLUCOSE 123*  CALCIUM 8.9     Mr Lumbar Spine Wo Contrast  Result Date: 07/20/2017 CLINICAL DATA:  52 y/o M; severe leg pain with activity and numbness in the left leg. EXAM: MRI LUMBAR SPINE WITHOUT CONTRAST TECHNIQUE: Multiplanar, multisequence MR imaging of the lumbar spine was performed. No intravenous contrast was administered. COMPARISON:  None. FINDINGS: Segmentation:  Standard. Alignment:  Physiologic. Vertebrae: Diffusely heterogeneous and decreased signal  within bone marrow on T1 and T2 weighted sequences. No bone marrow or disc edema. Conus medullaris and cauda equina: Conus extends to the L1 level. Conus and cauda equina appear normal. Paraspinal and other soft tissues: Negative. Disc levels: L1-2: No significant disc displacement, foraminal stenosis, or canal stenosis. L2-3: No significant disc displacement, foraminal stenosis, or canal stenosis. L3-4: No significant disc displacement, foraminal stenosis, or canal stenosis. L4-5: Small disc bulge and mild facet hypertrophy. Mild bilateral foraminal stenosis. No canal stenosis. L5-S1: Small disc bulge with left foraminal marginal osteophytes and left-greater-than-right facet hypertrophy. Mild right and moderate left foraminal stenosis. No canal stenosis. IMPRESSION: 1. Lumbar spondylosis predominantly at L4-5 and L5-S1 levels. 2. Mild bilateral L4-5 foraminal stenosis as well as mild right and moderate left L5-S1 foraminal stenosis. 3. No significant canal stenosis. 4. Diffuse heterogeneous and decreased bone marrow signal probably represents marrow reconversion in the setting of heart failure and chronic kidney disease. Iron deposition or myelofibrosis is also possible. Electronically Signed   By: Mitzi HansenLance  Furusawa-Stratton M.D.   On: 07/20/2017 17:44   Dg Chest Port 1 View  Result Date: 07/20/2017 CLINICAL DATA:  Shortness of breath, pain in both legs EXAM: PORTABLE CHEST 1 VIEW COMPARISON:  11/16/2016 FINDINGS: The heart size and mediastinal contours are within normal limits. Both lungs are clear. The visualized skeletal structures are unremarkable. IMPRESSION: No active disease. Electronically Signed   By: Elige KoHetal  Patel   On: 07/20/2017 16:04    Lennox SoldersWinfrey, Amanda C, MD 07/20/2017, 9:19 PM PGY-1, [facility] FPTS Intern pager: [phone], text pages welcome  UPPER LEVEL ADDENDUM  I have read the above note and made revisions highlighted in blue.  Noralee CharsAsiyah Aiyonna Lucado, MD, PGY-3 [facility]

## 2017-07-20 NOTE — ED Notes (Signed)
Pt is still in MRI.

## 2017-07-20 NOTE — ED Provider Notes (Signed)
[facility] EMERGENCY DEPARTMENT Provider Note   CSN: [id number] Arrival date & time: 07/20/17  1517     History   Chief Complaint Chief Complaint  Patient presents with  . Congestive Heart Failure    HPI Treyvonne Tata is a 52 y.o. male.  He presents by ambulance complaining of unable to ambulate since Thursday.  He has been troubled with sciatic complaints for over a month.  He has had multiple falls and fell again on Thursday.  He was seen at [facility] where they did some plain films and put his right arm in a splint for an elbow effusion.  He states that was the last time he could ambulate.  Since then he has been at home in a recliner.  States if he tries to stand his legs will not hold him up because they are so weak.  He also complains that they are extremely painful.  There is no significant rest pain but as soon as he tries to engage his muscle groups he winces in pain.  He denies any fever.  He states his shortness of breath is at baseline.  He states he is never had an MRI.  He denies being on steroids.  No chest pain no abdominal pain no incontinence of urine or stool.  He is complaining of numbness on his left leg.  The history is provided by the patient.  Extremity Weakness  This is a new problem. The current episode started more than 1 week ago. The problem occurs constantly. The problem has not changed since onset.Associated symptoms include shortness of breath (baseline). Pertinent negatives include no chest pain, no abdominal pain and no headaches. The symptoms are aggravated by walking and standing. Nothing relieves the symptoms. He has tried rest for the symptoms. The treatment provided no relief.    Past Medical History:  Diagnosis Date  . Chest pain    a. In setting of HTN Urgency w/ trop elevation-->05/2016 MV: no ischemia-->low risk.  . Chronic diastolic CHF (congestive heart failure) (HCC)    a. 10/2015 Echo: EF 55-60%, no rwma; b. 05/2016 Echo: EF  65-70%, sev LVH, Gr2 DD, triv AI, mildly dil LA, nl RV size/fxn, mildly-mod dil RA.  . CKD (chronic kidney disease), stage III (HCC)   . Dilated aortic root (HCC)    a. 10/2015 Echo: Ao root 41mm;  b. 05/2016 Echo: triv AI.  Marland Kitchen Elevated troponin    a. 10/2015, 05/2016, & 10/2016 in the setting of HTN Urgency;  b. 05/2016 MV: no ischemia-->Low risk.  . H/O medication noncompliance   . Hypertension     Patient Active Problem List   Diagnosis Date Noted  . Foot pain, right 05/07/2017  . Leg pain 01/27/2017  . Obesity 11/16/2016  . HTN (hypertension) 11/08/2016  . Chronic diastolic heart failure (HCC) 06/25/2016  . Snoring 06/25/2016  . CKD (chronic kidney disease), stage III (HCC) 06/15/2016  . Demand ischemia (HCC) 06/15/2016  . Pain and swelling of elbow   . Acute on chronic diastolic CHF (congestive heart failure) (HCC)   . H/O medication noncompliance   . Chest pain with moderate risk for cardiac etiology 10/17/2015  . Malignant hypertension 10/17/2015  . AKI (acute kidney injury) (HCC) 10/17/2015    Past Surgical History:  Procedure Laterality Date  . CHOLECYSTECTOMY    . HERNIA REPAIR          Home Medications    Prior to Admission medications   Medication Sig Start  Date End Date Taking? Authorizing Provider  allopurinol (ZYLOPRIM) 100 MG tablet Take 1 tablet (100 mg total) by mouth daily. 12/16/16   Iran Ouch, MD  aspirin 81 MG chewable tablet Chew 1 tablet (81 mg total) by mouth daily. 01/31/17   Creig Hines, NP  carvedilol (COREG) 12.5 MG tablet Take 12.5 mg by mouth 2 (two) times daily with a meal.    [provider]  cyclobenzaprine (FLEXERIL) 5 MG tablet Take 1-2 tablets 3 times daily as needed 07/16/17   Enid Derry, PA-C  hydrALAZINE (APRESOLINE) 50 MG tablet Take 50 mg by mouth 3 (three) times daily.    [provider]  lidocaine (LIDODERM) 5 % Place 1 patch onto the skin every 12 (twelve) hours. Remove & Discard patch within  12 hours or as directed by MD 07/16/17 07/16/18  Enid Derry, PA-C  metolazone (ZAROXOLYN) 2.5 MG tablet Take 1 tablet (2.5 mg total) by mouth daily. 05/06/17 08/04/17  Delma Freeze, FNP  nitroGLYCERIN (NITROSTAT) 0.4 MG SL tablet Place 1 tablet (0.4 mg total) under the tongue every 5 (five) minutes as needed for chest pain. 11/18/16   Enid Baas, MD  oxyCODONE-acetaminophen (PERCOCET) 5-325 MG tablet Take 1 tablet by mouth every 4 (four) hours as needed for severe pain. 07/16/17 07/16/18  Enid Derry, PA-C  potassium chloride SA (K-DUR,KLOR-CON) 20 MEQ tablet Take 1 tablet (20 mEq total) by mouth 2 (two) times daily. And take extra when taking metolazone 05/06/17   Clarisa Kindred A, FNP  spironolactone (ALDACTONE) 25 MG tablet Take 25 mg by mouth daily.    [provider]  torsemide (DEMADEX) 20 MG tablet Take 60 mg by mouth 2 (two) times daily.    [provider]    Family History Family History  Problem Relation Age of Onset  . Hypertension Mother   . Heart attack Mother   . CVA Mother   . Heart disease Sister     Social History Social History   Tobacco Use  . Smoking status: Former Smoker    Last attempt to quit: 08/17/2015    Years since quitting: 1.9  . Smokeless tobacco: Never Used  Substance Use Topics  . Alcohol use: No    Alcohol/week: 0.0 oz  . Drug use: No     Allergies   Codeine   Review of Systems Review of Systems  Constitutional: Positive for chills. Negative for fever.  HENT: Negative for ear pain, nosebleeds and sore throat.   Eyes: Negative for pain and visual disturbance.  Respiratory: Positive for shortness of breath (baseline). Negative for cough and choking.   Cardiovascular: Negative for chest pain and palpitations.  Gastrointestinal: Negative for abdominal pain, diarrhea, nausea and vomiting.  Genitourinary: Negative for dysuria and hematuria.  Musculoskeletal: Positive for back pain and extremity weakness. Negative for  arthralgias and neck pain.  Skin: Negative for color change and rash.  Neurological: Positive for weakness and numbness. Negative for seizures, syncope and headaches.  All other systems reviewed and are negative.    Physical Exam Updated Vital Signs BP (!) 167/114 (BP Location: Left Arm)   Pulse (!) 105   Temp 98.8 F (37.1 C) (Oral)   Resp 18   SpO2 98%   Physical Exam  Constitutional: He is oriented to person, place, and time. He appears well-developed and well-nourished.  HENT:  Head: Normocephalic and atraumatic.  Eyes: Conjunctivae are normal.  Neck: Neck supple.  Cardiovascular: Normal rate and regular rhythm.  No  murmur heard. Pulmonary/Chest: Effort normal and breath sounds normal. No respiratory distress.  Abdominal: Soft. There is no tenderness.  Musculoskeletal: He exhibits tenderness (diffuse bilat lower ext).  Right upper extremity in a long-arm posterior splint.  He is got normal CSMs of his hand.  Neurological: He is alert and oriented to person, place, and time. He displays no atrophy. A sensory deficit (Subjective decreased sensation left lower extremity entire.) is present. No cranial nerve deficit. GCS eye subscore is 4. GCS verbal subscore is 5. GCS motor subscore is 6.  Skin weakness of bilateral hip flexors quads hamstrings and ankle flexion and extension.  He is reasonable strength with EHL.  Pain seems to be limiting him with his ability to give a good exam.  Skin: Skin is warm and dry.  Psychiatric: He has a normal mood and affect.  Nursing note and vitals reviewed.    ED Treatments / Results  Labs (all labs ordered are listed, but only abnormal results are displayed) Labs Reviewed  BASIC METABOLIC PANEL - Abnormal; Notable for the following components:      Result Value   Potassium 3.4 (*)    Chloride 98 (*)    Glucose, Bld 123 (*)    BUN 23 (*)    Creatinine, Ser 2.19 (*)    GFR calc non Af Amer 33 (*)    GFR calc Af Amer 38 (*)    All other  components within normal limits  URINALYSIS, ROUTINE W REFLEX MICROSCOPIC - Abnormal; Notable for the following components:   Color, Urine AMBER (*)    APPearance HAZY (*)    Protein, ur 100 (*)    Bacteria, UA RARE (*)    All other components within normal limits  URIC ACID - Abnormal; Notable for the following components:   Uric Acid, Serum 13.4 (*)    All other components within normal limits  TROPONIN I - Abnormal; Notable for the following components:   Troponin I 0.07 (*)    All other components within normal limits  TROPONIN I - Abnormal; Notable for the following components:   Troponin I 0.08 (*)    All other components within normal limits  TROPONIN I - Abnormal; Notable for the following components:   Troponin I 0.06 (*)    All other components within normal limits  HEMOGLOBIN A1C - Abnormal; Notable for the following components:   Hgb A1c MFr Bld 6.2 (*)    All other components within normal limits  BASIC METABOLIC PANEL - Abnormal; Notable for the following components:   Chloride 96 (*)    Glucose, Bld 142 (*)    BUN 25 (*)    Creatinine, Ser 2.18 (*)    Calcium 8.6 (*)    GFR calc non Af Amer 33 (*)    GFR calc Af Amer 38 (*)    All other components within normal limits  VITAMIN B12 - Abnormal; Notable for the following components:   Vitamin B-12 979 (*)    All other components within normal limits  RAPID URINE DRUG SCREEN, HOSP PERFORMED - Abnormal; Notable for the following components:   Opiates POSITIVE (*)    All other components within normal limits  CK - Abnormal; Notable for the following components:   Total CK 436 (*)    All other components within normal limits  SEDIMENTATION RATE - Abnormal; Notable for the following components:   Sed Rate 65 (*)    All other components within normal limits  C-REACTIVE PROTEIN - Abnormal; Notable for the following components:   CRP 18.6 (*)    All other components within normal limits  LACTATE DEHYDROGENASE -  Abnormal; Notable for the following components:   LDH 227 (*)    All other components within normal limits  COMPREHENSIVE METABOLIC PANEL - Abnormal; Notable for the following components:   Potassium 2.9 (*)    Chloride 93 (*)    Glucose, Bld 217 (*)    BUN 45 (*)    Creatinine, Ser 2.23 (*)    GFR calc non Af Amer 32 (*)    GFR calc Af Amer 37 (*)    All other components within normal limits  I-STAT TROPONIN, ED - Abnormal; Notable for the following components:   Troponin i, poc 0.09 (*)    All other components within normal limits  CBC WITH DIFFERENTIAL/PLATELET  BRAIN NATRIURETIC PEPTIDE  TSH  CBC  HIV ANTIBODY (ROUTINE TESTING)  RPR  FOLATE  VITAMIN B12  ANTI-JO 1 ANTIBODY, IGG  COPPER, SERUM  BASIC METABOLIC PANEL  BASIC METABOLIC PANEL    EKG EKG Interpretation  Date/Time:  Wednesday July 20 2017 16:05:48 EDT Ventricular Rate:  100 PR Interval:    QRS Duration: 105 QT Interval:  365 QTC Calculation: 471 R Axis:   -60 Text Interpretation:  Sinus tachycardia Supraventricular bigeminy Left anterior fascicular block Abnormal R-wave progression, late transition Left ventricular hypertrophy Baseline wander in lead(s) V5 no pvcs compared with prior 7/18 Confirmed by Meridee Score [phone]) on 07/20/2017 4:10:07 PM Also confirmed by Meridee Score [phone]), editor Barbette Hair [phone])  on 07/21/2017 8:11:44 AM   Radiology Dg Elbow Complete Right (3+view)  Result Date: 07/21/2017 CLINICAL DATA:  Elbow pain after fall 4 days ago. EXAM: RIGHT ELBOW - COMPLETE 3+ VIEW COMPARISON:  Right elbow x-rays dated July 16, 2017. FINDINGS: Persistent elbow joint effusion. No discrete fracture identified. No dislocation. Joint spaces are preserved. Bone mineralization is normal. Triceps enthesopathy. Soft tissues are unremarkable. IMPRESSION: 1. Unchanged elbow joint effusion, suspicious for occult fracture given history of trauma. Electronically Signed   By: Obie Dredge M.D.   On:  07/21/2017 17:22   Mr Brain Wo Contrast  Result Date: 07/20/2017 CLINICAL DATA:  52 y/o  M; bilateral lower extremity weakness. EXAM: MRI HEAD WITHOUT CONTRAST TECHNIQUE: Multiplanar, multiecho pulse sequences of the brain and surrounding structures were obtained without intravenous contrast. COMPARISON:  10/17/2015 CT of the head. FINDINGS: Brain: No acute infarction, hemorrhage, hydrocephalus, extra-axial collection or mass lesion. Fewnonspecific foci of T2 FLAIR hyperintense signal abnormality in subcortical and periventricular white matter are compatible withmildchronic microvascular ischemic changes for age. Mildbrain parenchymal volume loss. Vascular: Normal flow voids. Skull and upper cervical spine: Normal marrow signal. Sinuses/Orbits: Negative. Other: None. IMPRESSION: 1. No acute intracranial abnormality identified. 2. Mild chronic microvascular ischemic changes and mild parenchymal volume loss of the brain. Electronically Signed   By: Mitzi Hansen M.D.   On: 07/20/2017 22:07   Mr Cervical Spine Wo Contrast  Result Date: 07/21/2017 CLINICAL DATA:  Severe LEFT leg pain 2 weeks ago resulting in fall, and RIGHT arm injury. Progressive lower extremity weakness. Assess myelopathy. History of chronic kidney disease, hypertension, heart failure. EXAM: MRI CERVICAL, THORACIC AND LUMBAR SPINE WITHOUT CONTRAST TECHNIQUE: Multiplanar and multiecho pulse sequences of the cervical spine, to include the craniocervical junction and cervicothoracic junction, and thoracic, were obtained without intravenous contrast. COMPARISON:  MRI of the head July 20, 2017 and MRI of the lumbar spine July 20, 2017 FINDINGS: MRI CERVICAL SPINE FINDINGS-mild motion degraded examination. ALIGNMENT: Maintained cervical lordosis.  No malalignment. VERTEBRAE/DISCS: Vertebral bodies are intact. Intervertebral disc morphology's and signal are normal. Multilevel mild chronic discogenic endplate changes lower cervical spine  without acute or abnormal bone marrow signal. Subcentimeter probable hemangioma ventral C2. CORD:Cervical spinal cord is normal morphology and signal characteristics from the cervicomedullary junction to level of T2-3, the most caudal well visualized level. POSTERIOR FOSSA, VERTEBRAL ARTERIES, PARASPINAL TISSUES: No MR findings of ligamentous injury. Vertebral artery flow voids present. Included posterior fossa and paraspinal soft tissues are nonacute. Mild symmetric paraspinal muscle atrophy, disproportionate to the degree of trapezius in remaining muscles. Posterior fossa arachnoid cyst. DISC LEVELS: C2-3: Annular bulging, uncovertebral hypertrophy. No canal stenosis or neural foraminal narrowing. C3-4: Uncovertebral hypertrophy without canal stenosis. Mild RIGHT neural foraminal narrowing. C4-5: RIGHT uncovertebral hypertrophy and mild RIGHT greater than LEFT facet arthropathy. No canal stenosis. Moderate to severe RIGHT neural foraminal narrowing. C5-6: Small broad-based disc bulge, uncovertebral hypertrophy. Minimal RIGHT, moderate LEFT facet arthropathy. No canal stenosis. Moderate RIGHT, mild LEFT neural foraminal narrowing. C6-7: Small central disc protrusion. No canal stenosis. Minimal RIGHT neural foraminal narrowing. C7-T1: No disc bulge, canal stenosis nor neural foraminal narrowing. MRI THORACIC SPINE FINDINGS-Mild motion degraded examination. ALIGNMENT: Maintenance of the thoracic kyphosis. No malalignment. VERTEBRAE/DISCS: Vertebral bodies are intact. Multilevel mild degenerative discs associated with desiccation mild chronic discogenic endplate changes. No acute or abnormal bone marrow signal. CORD: Thoracic spinal cord is normal morphology and signal characteristics to the level of the conus medullaris which terminates at T12-L1. PREVERTEBRAL AND PARASPINAL SOFT TISSUES: Moderate symmetric paraspinal muscle atrophy (multifidus, longissimus) solid normalizing toward the lumbar spine. Normal  appearance the trapezius. 9 mm T2 bright cyst at T9-10 paraspinal soft tissues, nonspecific. DISC LEVELS: No significant disc bulge, canal stenosis or neural foraminal narrowing IMPRESSION: MRI cervical spine: 1. No fracture, malalignment or abnormal spinal cord signal on this motion degraded examination. 2. No canal stenosis. Neural foraminal narrowing C3-4 through C6-7: Moderate to severe on the RIGHT at C4-5. 3. Mild paraspinal muscle atrophy, disproportionate to remainder of included musculature. Though nonspecific, spinal muscular atrophy/muscular dystrophy could have this appearance. MRI thoracic spine: 1. No fracture, malalignment or abnormal spinal cord signal on this motion degraded examination. 2. No canal stenosis or neural foraminal narrowing. 3. Mild paraspinal muscle atrophy, disproportionate to remainder of included musculature. Though nonspecific, spinal muscular atrophy/muscular dystrophy could have this appearance. If symptoms persist, histopathologic sampling may be indicated. Electronically Signed   By: Awilda Metro M.D.   On: 07/21/2017 20:26   Mr Thoracic Spine Wo Contrast  Result Date: 07/21/2017 CLINICAL DATA:  Severe LEFT leg pain 2 weeks ago resulting in fall, and RIGHT arm injury. Progressive lower extremity weakness. Assess myelopathy. History of chronic kidney disease, hypertension, heart failure. EXAM: MRI CERVICAL, THORACIC AND LUMBAR SPINE WITHOUT CONTRAST TECHNIQUE: Multiplanar and multiecho pulse sequences of the cervical spine, to include the craniocervical junction and cervicothoracic junction, and thoracic, were obtained without intravenous contrast. COMPARISON:  MRI of the head July 20, 2017 and MRI of the lumbar spine July 20, 2017 FINDINGS: MRI CERVICAL SPINE FINDINGS-mild motion degraded examination. ALIGNMENT: Maintained cervical lordosis.  No malalignment. VERTEBRAE/DISCS: Vertebral bodies are intact. Intervertebral disc morphology's and signal are normal.  Multilevel mild chronic discogenic endplate changes lower cervical spine without acute or abnormal bone marrow signal. Subcentimeter probable hemangioma ventral C2. CORD:Cervical spinal cord is normal morphology and signal characteristics from the cervicomedullary junction to level  of T2-3, the most caudal well visualized level. POSTERIOR FOSSA, VERTEBRAL ARTERIES, PARASPINAL TISSUES: No MR findings of ligamentous injury. Vertebral artery flow voids present. Included posterior fossa and paraspinal soft tissues are nonacute. Mild symmetric paraspinal muscle atrophy, disproportionate to the degree of trapezius in remaining muscles. Posterior fossa arachnoid cyst. DISC LEVELS: C2-3: Annular bulging, uncovertebral hypertrophy. No canal stenosis or neural foraminal narrowing. C3-4: Uncovertebral hypertrophy without canal stenosis. Mild RIGHT neural foraminal narrowing. C4-5: RIGHT uncovertebral hypertrophy and mild RIGHT greater than LEFT facet arthropathy. No canal stenosis. Moderate to severe RIGHT neural foraminal narrowing. C5-6: Small broad-based disc bulge, uncovertebral hypertrophy. Minimal RIGHT, moderate LEFT facet arthropathy. No canal stenosis. Moderate RIGHT, mild LEFT neural foraminal narrowing. C6-7: Small central disc protrusion. No canal stenosis. Minimal RIGHT neural foraminal narrowing. C7-T1: No disc bulge, canal stenosis nor neural foraminal narrowing. MRI THORACIC SPINE FINDINGS-Mild motion degraded examination. ALIGNMENT: Maintenance of the thoracic kyphosis. No malalignment. VERTEBRAE/DISCS: Vertebral bodies are intact. Multilevel mild degenerative discs associated with desiccation mild chronic discogenic endplate changes. No acute or abnormal bone marrow signal. CORD: Thoracic spinal cord is normal morphology and signal characteristics to the level of the conus medullaris which terminates at T12-L1. PREVERTEBRAL AND PARASPINAL SOFT TISSUES: Moderate symmetric paraspinal muscle atrophy (multifidus,  longissimus) solid normalizing toward the lumbar spine. Normal appearance the trapezius. 9 mm T2 bright cyst at T9-10 paraspinal soft tissues, nonspecific. DISC LEVELS: No significant disc bulge, canal stenosis or neural foraminal narrowing IMPRESSION: MRI cervical spine: 1. No fracture, malalignment or abnormal spinal cord signal on this motion degraded examination. 2. No canal stenosis. Neural foraminal narrowing C3-4 through C6-7: Moderate to severe on the RIGHT at C4-5. 3. Mild paraspinal muscle atrophy, disproportionate to remainder of included musculature. Though nonspecific, spinal muscular atrophy/muscular dystrophy could have this appearance. MRI thoracic spine: 1. No fracture, malalignment or abnormal spinal cord signal on this motion degraded examination. 2. No canal stenosis or neural foraminal narrowing. 3. Mild paraspinal muscle atrophy, disproportionate to remainder of included musculature. Though nonspecific, spinal muscular atrophy/muscular dystrophy could have this appearance. If symptoms persist, histopathologic sampling may be indicated. Electronically Signed   By: Awilda Metro M.D.   On: 07/21/2017 20:26   Mr Lumbar Spine Wo Contrast  Result Date: 07/20/2017 CLINICAL DATA:  52 y/o M; severe leg pain with activity and numbness in the left leg. EXAM: MRI LUMBAR SPINE WITHOUT CONTRAST TECHNIQUE: Multiplanar, multisequence MR imaging of the lumbar spine was performed. No intravenous contrast was administered. COMPARISON:  None. FINDINGS: Segmentation:  Standard. Alignment:  Physiologic. Vertebrae: Diffusely heterogeneous and decreased signal within bone marrow on T1 and T2 weighted sequences. No bone marrow or disc edema. Conus medullaris and cauda equina: Conus extends to the L1 level. Conus and cauda equina appear normal. Paraspinal and other soft tissues: Negative. Disc levels: L1-2: No significant disc displacement, foraminal stenosis, or canal stenosis. L2-3: No significant disc  displacement, foraminal stenosis, or canal stenosis. L3-4: No significant disc displacement, foraminal stenosis, or canal stenosis. L4-5: Small disc bulge and mild facet hypertrophy. Mild bilateral foraminal stenosis. No canal stenosis. L5-S1: Small disc bulge with left foraminal marginal osteophytes and left-greater-than-right facet hypertrophy. Mild right and moderate left foraminal stenosis. No canal stenosis. IMPRESSION: 1. Lumbar spondylosis predominantly at L4-5 and L5-S1 levels. 2. Mild bilateral L4-5 foraminal stenosis as well as mild right and moderate left L5-S1 foraminal stenosis. 3. No significant canal stenosis. 4. Diffuse heterogeneous and decreased bone marrow signal probably represents marrow reconversion in the setting of heart  failure and chronic kidney disease. Iron deposition or myelofibrosis is also possible. Electronically Signed   By: Mitzi Hansen M.D.   On: 07/20/2017 17:44   Mr Knee Right Wo Contrast  Result Date: 07/21/2017 CLINICAL DATA:  Acute anterior knee pain for the past 2 weeks. No known injury. EXAM: MRI OF THE RIGHT KNEE WITHOUT CONTRAST TECHNIQUE: Multiplanar, multisequence MR imaging of the knee was performed. No intravenous contrast was administered. COMPARISON:  None. FINDINGS: Despite efforts by the technologist and patient, motion artifact is present on today's exam and could not be eliminated. This reduces exam sensitivity and specificity. MENISCI Medial meniscus:  Blunting of the body free edge. Lateral meniscus:  Intact. LIGAMENTS Cruciates:  Intact ACL and PCL. Collaterals: Medial collateral ligament is intact. Lateral collateral ligament complex is intact. CARTILAGE Patellofemoral: High-grade near full-thickness cartilage loss over the patellar apex extending into the medial facet, with underlying subchondral cystic change. Medial: High-grade near full-thickness cartilage loss over the central weight-bearing medial femoral condyle and medial tibial plateau  with underlying subchondral marrow edema. Lateral: Small area of delamination along the central lateral tibial plateau. No full-thickness defect. Joint: Large joint effusion with synovitis. Small layering component within the lateral suprapatellar joint space could reflect a small amount of hemorrhage. No plical thickening. Normal Hoffa's fat. Popliteal Fossa:  No Baker cyst. Intact popliteus tendon. Extensor Mechanism:  Intact quadriceps tendon and patellar tendon. Bones: No fracture or dislocation. Small tricompartmental osteophytes. Other: Moderate subcutaneous edema along the lateral knee. No focal fluid collection. Feathery edema within the distal vastus lateralis muscle. IMPRESSION: 1. Large joint effusion with synovitis. Small layering component within the lateral suprapatellar joint space could reflect a small amount of hemorrhage. Consider further evaluation with arthrocentesis. 2. Small radial tear of the medial meniscus body. 3. High-grade cartilage loss in the medial and patellofemoral compartments as described above. 4. Small amount of edema within the distal vastus lateralis muscle may reflect strain. Electronically Signed   By: Obie Dredge M.D.   On: 07/21/2017 21:07   Dg Chest Port 1 View  Result Date: 07/20/2017 CLINICAL DATA:  Shortness of breath, pain in both legs EXAM: PORTABLE CHEST 1 VIEW COMPARISON:  11/16/2016 FINDINGS: The heart size and mediastinal contours are within normal limits. Both lungs are clear. The visualized skeletal structures are unremarkable. IMPRESSION: No active disease. Electronically Signed   By: Elige Ko   On: 07/20/2017 16:04    Procedures Procedures (including critical care time)  Medications Ordered in ED Medications  oxyCODONE-acetaminophen (PERCOCET/ROXICET) 5-325 MG per tablet 2 tablet (has no administration in time range)     Initial Impression / Assessment and Plan / ED Course  I have reviewed the triage vital signs and the nursing  notes.  Pertinent labs & imaging results that were available during my care of the patient were reviewed by me and considered in my medical decision making (see chart for details).  Clinical Course as of Jul 23 1043  Wed Jul 20, 2017  1607 Differential diagnosis includes cauda equina, peripheral neuropathy, spinal stenosis, right radicular impingement, metabolic derangement.  MRI has been ordered patient does feel like he would be able to lay down for this.   [MB]  1735 Patient's return from MRI states he tolerated it well.  His caregiver is here now with supplies corroborating information that he has been unable to get out of his lift chair since Thursday.  Noticed that his urine has been dark.   [MB]  1923 disCussed with  family practice team who will admit him to their service.   [MB]    Clinical Course User Index [MB] Terrilee Files, MD     Final Clinical Impressions(s) / ED Diagnoses   Final diagnoses:  Pain  Weakness of both lower extremities    ED Discharge Orders    None       Terrilee Files, MD 07/22/17 1047

## 2017-07-20 NOTE — ED Triage Notes (Signed)
Per pt her was at home and for the past two days he has been experiencing lower extremity weakness more than normal. States his legs have been swelling more than normal. He stated he felt he could not get up and walk bc he felt he would fall. Pt has history of sciatica nerve issues and pinched nerve in his back. Pt has history of CHF also.152/108, 965, 80 hr, cbg 137

## 2017-07-21 ENCOUNTER — Inpatient Hospital Stay (HOSPITAL_COMMUNITY): Payer: Medicaid Other

## 2017-07-21 ENCOUNTER — Other Ambulatory Visit: Payer: Self-pay

## 2017-07-21 DIAGNOSIS — R29898 Other symptoms and signs involving the musculoskeletal system: Secondary | ICD-10-CM

## 2017-07-21 DIAGNOSIS — N179 Acute kidney failure, unspecified: Secondary | ICD-10-CM

## 2017-07-21 DIAGNOSIS — M79609 Pain in unspecified limb: Secondary | ICD-10-CM

## 2017-07-21 DIAGNOSIS — M7989 Other specified soft tissue disorders: Secondary | ICD-10-CM

## 2017-07-21 DIAGNOSIS — N184 Chronic kidney disease, stage 4 (severe): Secondary | ICD-10-CM

## 2017-07-21 DIAGNOSIS — R52 Pain, unspecified: Secondary | ICD-10-CM

## 2017-07-21 DIAGNOSIS — R9431 Abnormal electrocardiogram [ECG] [EKG]: Secondary | ICD-10-CM

## 2017-07-21 LAB — BASIC METABOLIC PANEL
Anion gap: 12 (ref 5–15)
BUN: 25 mg/dL — ABNORMAL HIGH (ref 6–20)
CALCIUM: 8.6 mg/dL — AB (ref 8.9–10.3)
CO2: 27 mmol/L (ref 22–32)
CREATININE: 2.18 mg/dL — AB (ref 0.61–1.24)
Chloride: 96 mmol/L — ABNORMAL LOW (ref 101–111)
GFR calc non Af Amer: 33 mL/min — ABNORMAL LOW (ref >60)
GFR, EST AFRICAN AMERICAN: 38 mL/min — AB (ref >60)
Glucose, Bld: 142 mg/dL — ABNORMAL HIGH (ref 65–99)
Potassium: 3.5 mmol/L (ref 3.5–5.1)
SODIUM: 135 mmol/L (ref 135–145)

## 2017-07-21 LAB — COMPREHENSIVE METABOLIC PANEL
ALBUMIN: 4.1 g/dL (ref 3.5–5.0)
ALK PHOS: 94 U/L (ref 38–126)
ALT: 29 U/L (ref 17–63)
ANION GAP: 15 (ref 5–15)
AST: 34 U/L (ref 15–41)
BILIRUBIN TOTAL: 0.9 mg/dL (ref 0.3–1.2)
BUN: 45 mg/dL — AB (ref 6–20)
CALCIUM: 9.3 mg/dL (ref 8.9–10.3)
CO2: 29 mmol/L (ref 22–32)
CREATININE: 2.23 mg/dL — AB (ref 0.61–1.24)
Chloride: 93 mmol/L — ABNORMAL LOW (ref 101–111)
GFR calc Af Amer: 37 mL/min — ABNORMAL LOW (ref >60)
GFR calc non Af Amer: 32 mL/min — ABNORMAL LOW (ref >60)
Glucose, Bld: 217 mg/dL — ABNORMAL HIGH (ref 65–99)
Potassium: 2.9 mmol/L — ABNORMAL LOW (ref 3.5–5.1)
Sodium: 137 mmol/L (ref 135–145)
TOTAL PROTEIN: 7.2 g/dL (ref 6.5–8.1)

## 2017-07-21 LAB — CBC
HCT: 42.9 % (ref 39.0–52.0)
Hemoglobin: 14 g/dL (ref 13.0–17.0)
MCH: 29.5 pg (ref 26.0–34.0)
MCHC: 32.6 g/dL (ref 30.0–36.0)
MCV: 90.5 fL (ref 78.0–100.0)
PLATELETS: 310 10*3/uL (ref 150–400)
RBC: 4.74 MIL/uL (ref 4.22–5.81)
RDW: 13.3 % (ref 11.5–15.5)
WBC: 9.5 10*3/uL (ref 4.0–10.5)

## 2017-07-21 LAB — SEDIMENTATION RATE: SED RATE: 65 mm/h — AB (ref 0–16)

## 2017-07-21 LAB — TROPONIN I
TROPONIN I: 0.07 ng/mL — AB (ref <0.03)
TROPONIN I: 0.08 ng/mL — AB (ref <0.03)
Troponin I: 0.06 ng/mL (ref <0.03)

## 2017-07-21 LAB — VITAMIN B12
VITAMIN B 12: 979 pg/mL — AB (ref 180–914)
Vitamin B-12: 881 pg/mL (ref 180–914)

## 2017-07-21 LAB — URIC ACID: Uric Acid, Serum: 13.4 mg/dL — ABNORMAL HIGH (ref 4.4–7.6)

## 2017-07-21 LAB — TSH: TSH: 2.689 u[IU]/mL (ref 0.350–4.500)

## 2017-07-21 LAB — RAPID URINE DRUG SCREEN, HOSP PERFORMED
Amphetamines: NOT DETECTED
BARBITURATES: NOT DETECTED
BENZODIAZEPINES: NOT DETECTED
Cocaine: NOT DETECTED
Opiates: POSITIVE — AB
TETRAHYDROCANNABINOL: NOT DETECTED

## 2017-07-21 LAB — C-REACTIVE PROTEIN: CRP: 18.6 mg/dL — ABNORMAL HIGH (ref <1.0)

## 2017-07-21 LAB — FOLATE: Folate: 15.8 ng/mL (ref >5.9)

## 2017-07-21 LAB — ECHOCARDIOGRAM COMPLETE
Height: 72 in
WEIGHTICAEL: 4874.81 [oz_av]

## 2017-07-21 LAB — HIV ANTIBODY (ROUTINE TESTING W REFLEX): HIV Screen 4th Generation wRfx: NONREACTIVE

## 2017-07-21 LAB — CK: Total CK: 436 U/L — ABNORMAL HIGH (ref 49–397)

## 2017-07-21 LAB — LACTATE DEHYDROGENASE: LDH: 227 U/L — ABNORMAL HIGH (ref 98–192)

## 2017-07-21 LAB — RPR: RPR: NONREACTIVE

## 2017-07-21 MED ORDER — ALLOPURINOL 100 MG PO TABS
100.0000 mg | ORAL_TABLET | Freq: Every day | ORAL | Status: DC
  Administered 2017-07-21 – 2017-07-25 (×5): 100 mg via ORAL
  Filled 2017-07-21 (×5): qty 1

## 2017-07-21 MED ORDER — OXYCODONE HCL 5 MG PO TABS
5.0000 mg | ORAL_TABLET | Freq: Once | ORAL | Status: AC
  Administered 2017-07-21: 5 mg via ORAL
  Filled 2017-07-21: qty 1

## 2017-07-21 MED ORDER — OXYCODONE HCL 5 MG PO TABS
5.0000 mg | ORAL_TABLET | Freq: Once | ORAL | Status: AC | PRN
  Administered 2017-07-21: 5 mg via ORAL
  Filled 2017-07-21: qty 1

## 2017-07-21 NOTE — Evaluation (Signed)
Physical Therapy Evaluation Patient Details Name: Trevor Nunez MRN: [id number] DOB: 1965-12-30 Today's Date: 07/21/2017   History of Present Illness  52 yo male with onset of low back pain and B thigh pain was admitted for evaluation. Pt had fall on 3/28 and has now elevated troponin and LE edema spec feet.  Previous workup with neurologist shows lumbar spondylosis and foraminal stenosis at L4-5 and L5-S1, as well as L lateral femoral cutaneous n entrapment.  PMHx:  CHF, morbid obesity, lateral femoral cutaneous n entrapment, HTN, CKD.  Clinical Impression  Pt was admitted with pain that is worsening over legs from thighs to feet, and has findings in his imaging for spondylosis and stenosis of foraminae from L4-5 and L5-S1.  Will plan to see for mobility and strengthening ex's to help with edema in LE's which could be irritating if L lat fem cutaneous n is entrapped.  Pt is motivated to try but hindered by pain and may benefit as well if he could use ice or heat to back for management of symptoms, or on lateral L thigh.  Pt reports worsening RLE pain now.    Follow Up Recommendations CIR    Equipment Recommendations  None recommended by PT    Recommendations for Other Services Rehab consult     Precautions / Restrictions Precautions Precautions: Back;Fall Precaution Booklet Issued: No Precaution Comments: pt did not move enough to use the precautions Restrictions Weight Bearing Restrictions: No      Mobility  Bed Mobility Overal bed mobility: Needs Assistance Bed Mobility: (scooting up bed)           General bed mobility comments: pt would only let PT scoot him up in bed with trendelenburg and used his UE's and LLE to assist the process  Transfers Overall transfer level: Needs assistance               General transfer comment: declined to attempt  Ambulation/Gait                Stairs            Wheelchair Mobility    Modified Rankin (Stroke Patients  Only)       Balance                                             Pertinent Vitals/Pain Pain Assessment: 0-10 Pain Score: 10-Worst pain ever Pain Location: back and legs from thighs to feet Pain Descriptors / Indicators: Grimacing;Sharp Pain Intervention(s): Monitored during session;Premedicated before session;Repositioned;Limited activity within patient's tolerance    Home Living Family/patient expects to be discharged to:: Private residence Living Arrangements: Other relatives Available Help at Discharge: Family;Available 24 hours/day Type of Home: Apartment Home Access: Level entry     Home Layout: One level Home Equipment: None      Prior Function Level of Independence: Independent         Comments: pt was living at home with other family and has been walking with no AD     Hand Dominance   Dominant Hand: Right    Extremity/Trunk Assessment   Upper Extremity Assessment Upper Extremity Assessment: Overall WFL for tasks assessed    Lower Extremity Assessment Lower Extremity Assessment: RLE deficits/detail RLE Deficits / Details: R leg is limited by pain to move but can generate mm contraction RLE: Unable to fully assess due to pain  Cervical / Trunk Assessment Cervical / Trunk Assessment: Other exceptions(in bed on his back so cannot see his posture)  Communication   Communication: No difficulties  Cognition Arousal/Alertness: Awake/alert Behavior During Therapy: Flat affect Overall Cognitive Status: Within Functional Limits for tasks assessed                                        General Comments      Exercises Other Exercises Other Exercises: LLE moves actively when bed in trendelenburg   Assessment/Plan    PT Assessment Patient needs continued PT services  PT Problem List Decreased strength;Decreased range of motion;Decreased activity tolerance;Decreased mobility;Decreased coordination;Decreased knowledge  of use of DME;Decreased knowledge of precautions;Obesity;Pain       PT Treatment Interventions DME instruction;Gait training;Functional mobility training;Therapeutic activities;Therapeutic exercise;Balance training;Neuromuscular re-education;Patient/family education    PT Goals (Current goals can be found in the Care Plan section)  Acute Rehab PT Goals Patient Stated Goal: to get his pain controlled and get stronger PT Goal Formulation: With patient Time For Goal Achievement: 08/04/17 Potential to Achieve Goals: Good    Frequency Min 2X/week   Barriers to discharge Decreased caregiver support may not have family able to help lift him    Co-evaluation               AM-PAC PT "6 Clicks" Daily Activity  Outcome Measure Difficulty turning over in bed (including adjusting bedclothes, sheets and blankets)?: Unable Difficulty moving from lying on back to sitting on the side of the bed? : Unable Difficulty sitting down on and standing up from a chair with arms (e.g., wheelchair, bedside commode, etc,.)?: Unable Help needed moving to and from a bed to chair (including a wheelchair)?: Total Help needed walking in hospital room?: Total Help needed climbing 3-5 steps with a railing? : Total 6 Click Score: 6    End of Session   Activity Tolerance: Patient limited by pain Patient left: in bed;with call bell/phone within reach;with bed alarm set Nurse Communication: Mobility status PT Visit Diagnosis: Muscle weakness (generalized) (M62.81);Pain Pain - Right/Left: (Bilateral) Pain - part of body: Leg;Hip;Knee;Ankle and joints of foot(Back)    Time: 1610-9604 PT Time Calculation (min) (ACUTE ONLY): 28 min   Charges:   PT Evaluation $PT Eval Moderate Complexity: 1 Mod PT Treatments $Therapeutic Exercise: 8-22 mins   PT G Codes:   PT G-Codes **NOT FOR INPATIENT CLASS** Functional Assessment Tool Used: AM-PAC 6 Clicks Basic Mobility    Ivar Drape 07/21/2017, 10:03 AM   Samul Dada, PT MS Acute Rehab Dept. Number: [facility] [id number] and [facility] [phone]

## 2017-07-21 NOTE — Progress Notes (Signed)
VASCULAR LAB PRELIMINARY  ABI completed: Right ABI not be able to obtain due to patient has bandage at the region of upper extremity. Toe pressure is not reliable due to patient could not keep still due to pain.    RIGHT    LEFT    PRESSURE WAVEFORM  PRESSURE WAVEFORM  BRACHIAL N/A N/A BRACHIAL 130 triphasic  DP 142 Triphasic DP 127 triphasic  AT   AT    PT 148 triphasic PT 154 triphasic  PER   PER    GREAT TOE  NA GREAT TOE  NA    RIGHT LEFT  ABI  1.18     Trevor Nunez  Trevor Nunez, RVT 07/21/2017, 3:06 PM

## 2017-07-21 NOTE — Progress Notes (Signed)
Family Medicine Teaching Service Daily Progress Note Intern Pager: [phone]  Patient name: Trevor Nunez Medical record number: [id number] Date of birth: 04-Apr-1966 Age: 52 y.o. Gender: male  Primary Care Provider: Tracie Harrier, MD Consultants: None Code Status: Full  Pt Overview and Major Events to Date:  4/3 - admitted for bilateral LE weakness/pain.  Assessment and Plan: Trevor Nunez is a 52 y.o. male presenting with bilateral lower extremity weakness/pain. PMH is significant for HTN, CKD, HFpEF, morbid obesity.  Bilateral LE weakness and pain: Pain and weakness persists. MRI brain without significant abnormalities. Neuro exam limited due to pain, 3/5 weakness bilaterally. Pain with light palpation.  MRI lumbar spine significant for foraminal stenosis in lumbosacral region and spondylosis at L4-L5 and L5-S1. U/A with protein 100, hyaline, granular. and amorphous crystal casts and CK 436, could be muscular in origin. Cr also bumped 2.19. TSH normal. Plan to consult neurology given already extensive workup outpatient. - MRI cervical/thoracic - PT/OT - RPR pending - HIV pending - ABI's  - gabapentin 200 mg TID - consult neurology - LE dopplers - ESR/CRP  HFpEF: ECHO EF 60-65%, mild reg, G1DD.  Last echo in August 2018.  BNP 67.3 at admission. 2+ pitting edema noted to LE. Diuresed well with additional IV Lasix 35m in ED, outpt 2.5L. Continues on home torsemide 60 mg TID. Troponin 0.09 at admission, downtrended to 0.06. No pulmonary edema appreciated on exam.  Weight 304, stable from admission. Dry weight unknown. Trop downtrended.  - continue home torsemide  - repeat echo - strict I's and O's - daily weights  AoCKD: Baseline 1.8-2.0, SCr 2.19 at admission.  Patient's caregiver reported dark urine during the past week.  UA notable for protein 100, hyaline, granular. and amorphous crystal casts.  - daily BMP - replenish K as needed  HTN: Hypertensive to 160/95 on admission.   Home regimen includes Coreg 12.5 mg BID, hydralazine 50 mg TID, Torsemide 60 mg TID. - Continue coreg and hydralazine  - continue home medications (will start torsemide on 4/4)  H/o gout Previously on allopurinol. Uric acid elevated at 13.4. R knee warm and swollen, could be gout related - Restart allopurinol.  R arm injury s/p fall: Currently in sugar tong cast.  Patient says he was told that it is possibly that he had a fracture though none noted on initial ED xrays, would need follow up with orthopedics. Appointment supposed to be on 4/4. -  Will need to follow up with orthopedics outpatient   OSA: Recently diagnosis of OSA in February, still waiting on a CPAP machine at home  - CPAP at night   FEN/GI: heart healthy diet Prophylaxis: lovenox  Disposition: continue inpatient work up of bilateral LE weakness and pain  Subjective:  Patient's symptoms persistent from yesterday. Great pain with any movement or palpation of legs.  Objective: Temp:  [98.8 F (37.1 C)-100.3 F (37.9 C)] 98.8 F (37.1 C) (04/04 0428) Pulse Rate:  [96-105] 100 (04/04 0428) Resp:  [18-23] 18 (04/04 0428) BP: (128-167)/(77-114) 137/103 (04/04 0428) SpO2:  [92 %-98 %] 95 % (04/04 0428) Weight:  [304 lb 10.8 oz (138.2 kg)] 304 lb 10.8 oz (138.2 kg) (04/04 0428) Physical Exam: General: pleasant male, in NAD, lying in bed Cardiovascular: RRR, no murmur Respiratory: CTAB, no wheezes/rales/rhonchi Abdomen: obese abdomen, nontender to palpation, +BS MSK: Strength 3/5 to LE bilaterally (limited by pain), 5/5 UE 1-2+ pitting edema to LE edema.  Neuro: Alert and oriented, speech normal. Optic field normal. Extraocular movements  intact. Intact symmetric sensation to light touch of face and extremities bilaterally.  Hearing grossly intact bilaterally.  Tongue protrudes normally with no deviation.  Shoulder shrug, smile symmetric. Finger to nose normal.  Laboratory: Recent Labs  Lab 07/20/17 1800  07/21/17 0420  WBC 10.2 9.5  HGB 13.8 14.0  HCT 41.4 42.9  PLT 308 310   Recent Labs  Lab 07/20/17 1800 07/21/17 0420  NA 135 135  K 3.4* 3.5  CL 98* 96*  CO2 23 27  BUN 23* 25*  CREATININE 2.19* 2.18*  CALCIUM 8.9 8.6*  GLUCOSE 123* 142*   Imaging/Diagnostic Tests: Mr Brain Wo Contrast  Result Date: 07/20/2017 CLINICAL DATA:  52 y/o  M; bilateral lower extremity weakness. EXAM: MRI HEAD WITHOUT CONTRAST TECHNIQUE: Multiplanar, multiecho pulse sequences of the brain and surrounding structures were obtained without intravenous contrast. COMPARISON:  10/17/2015 CT of the head. FINDINGS: Brain: No acute infarction, hemorrhage, hydrocephalus, extra-axial collection or mass lesion. Fewnonspecific foci of T2 FLAIR hyperintense signal abnormality in subcortical and periventricular white matter are compatible withmildchronic microvascular ischemic changes for age. Mildbrain parenchymal volume loss. Vascular: Normal flow voids. Skull and upper cervical spine: Normal marrow signal. Sinuses/Orbits: Negative. Other: None. IMPRESSION: 1. No acute intracranial abnormality identified. 2. Mild chronic microvascular ischemic changes and mild parenchymal volume loss of the brain. Electronically Signed   By: Kristine Garbe M.D.   On: 07/20/2017 22:07   Mr Lumbar Spine Wo Contrast  Result Date: 07/20/2017 CLINICAL DATA:  52 y/o M; severe leg pain with activity and numbness in the left leg. EXAM: MRI LUMBAR SPINE WITHOUT CONTRAST TECHNIQUE: Multiplanar, multisequence MR imaging of the lumbar spine was performed. No intravenous contrast was administered. COMPARISON:  None. FINDINGS: Segmentation:  Standard. Alignment:  Physiologic. Vertebrae: Diffusely heterogeneous and decreased signal within bone marrow on T1 and T2 weighted sequences. No bone marrow or disc edema. Conus medullaris and cauda equina: Conus extends to the L1 level. Conus and cauda equina appear normal. Paraspinal and other soft tissues:  Negative. Disc levels: L1-2: No significant disc displacement, foraminal stenosis, or canal stenosis. L2-3: No significant disc displacement, foraminal stenosis, or canal stenosis. L3-4: No significant disc displacement, foraminal stenosis, or canal stenosis. L4-5: Small disc bulge and mild facet hypertrophy. Mild bilateral foraminal stenosis. No canal stenosis. L5-S1: Small disc bulge with left foraminal marginal osteophytes and left-greater-than-right facet hypertrophy. Mild right and moderate left foraminal stenosis. No canal stenosis. IMPRESSION: 1. Lumbar spondylosis predominantly at L4-5 and L5-S1 levels. 2. Mild bilateral L4-5 foraminal stenosis as well as mild right and moderate left L5-S1 foraminal stenosis. 3. No significant canal stenosis. 4. Diffuse heterogeneous and decreased bone marrow signal probably represents marrow reconversion in the setting of heart failure and chronic kidney disease. Iron deposition or myelofibrosis is also possible. Electronically Signed   By: Kristine Garbe M.D.   On: 07/20/2017 17:44   Dg Chest Port 1 View  Result Date: 07/20/2017 CLINICAL DATA:  Shortness of breath, pain in both legs EXAM: PORTABLE CHEST 1 VIEW COMPARISON:  11/16/2016 FINDINGS: The heart size and mediastinal contours are within normal limits. Both lungs are clear. The visualized skeletal structures are unremarkable. IMPRESSION: No active disease. Electronically Signed   By: Kathreen Devoid   On: 07/20/2017 16:04   Rory Percy, DO 07/21/2017, 7:11 AM PGY-1, [place] Intern pager: [phone], text pages welcome

## 2017-07-21 NOTE — Evaluation (Signed)
Occupational Therapy Evaluation Patient Details Name: Trevor Nunez MRN: [id number] DOB: 03/24/1966 Today's Date: 07/21/2017    History of Present Illness 52 yo male with onset of low back pain and B thigh pain was admitted for evaluation. Pt had fall on 3/28 and has now elevated troponin and LE edema spec feet.  Previous workup with neurologist shows lumbar spondylosis and foraminal stenosis at L4-5 and L5-S1, as well as L lateral femoral cutaneous n entrapment.  PMHx:  CHF, morbid obesity, lateral femoral cutaneous n entrapment, HTN, CKD.   Clinical Impression   This 52 y/o M presents with the above. At baseline pt reports independence with ADLs, iADLs and functional mobility. Limited eval this session as pt currently with significant pain in back and LEs, impacting his overall mobility and functional performance. Pt currently with RUE functional limitations due to recent fall PTA. Currently requires setup-MinA for bed level grooming/UB ADLs, Max-total assist for LB ADLs at bed level. Will continue to follow acutely to assess pt's mobility and OOB ADL status; pt may be appropriate for CIR level services, will be able to better determine once pt's pain levels decrease and he is able to participate in EOB/OOB activity.     Follow Up Recommendations  CIR;Other (comment)(to be further assessed )    Equipment Recommendations  Other (comment)(TBD in next venue )           Precautions / Restrictions Precautions Precautions: Back;Fall Precaution Booklet Issued: No Restrictions Weight Bearing Restrictions: No      Mobility Bed Mobility Overal bed mobility: Needs Assistance             General bed mobility comments: pt declines all mobility this session due to pain   Transfers                 General transfer comment: pt declines all mobility this session due to pain     Balance                                           ADL either performed or assessed  with clinical judgement   ADL Overall ADL's : Needs assistance/impaired Eating/Feeding: Set up;Bed level Eating/Feeding Details (indicate cue type and reason): setup assist to open drink containers Grooming: Set up;Bed level;Wash/dry face                                 General ADL Comments: setup assist provided for bed level grooming ADLs; pt significantly limited due to pain/fatigue, requiring max-totalA for LB ADLs at bed level                          Pertinent Vitals/Pain Pain Assessment: 0-10 Pain Score: 10-Worst pain ever Pain Location: back and legs from thighs to feet Pain Descriptors / Indicators: Grimacing;Sharp Pain Intervention(s): Limited activity within patient's tolerance;Monitored during session;Patient requesting pain meds-RN notified     Hand Dominance Right   Extremity/Trunk Assessment Upper Extremity Assessment Upper Extremity Assessment: RUE deficits/detail RUE Deficits / Details: RUE splinted/ace wrapped, pt demonstrates AROM approx 0-90*, cannot move beyond this range due to pain, able to move digits within available range that bandage permits  RUE: Unable to fully assess due to pain;Unable to fully assess due to immobilization   Lower Extremity Assessment Lower  Extremity Assessment: Defer to PT evaluation       Communication Communication Communication: No difficulties   Cognition Arousal/Alertness: Awake/alert Behavior During Therapy: Flat affect Overall Cognitive Status: Within Functional Limits for tasks assessed                                                      Home Living Family/patient expects to be discharged to:: Private residence Living Arrangements: Other relatives("roommate") Available Help at Discharge: Family;Friend(s) Type of Home: Apartment Home Access: Level entry     Home Layout: One level         Bathroom Toilet: Standard     Home Equipment: None          Prior  Functioning/Environment Level of Independence: Independent                 OT Problem List: Pain;Decreased strength;Impaired UE functional use;Decreased range of motion      OT Treatment/Interventions: Self-care/ADL training;DME and/or AE instruction;Therapeutic activities;Balance training;Therapeutic exercise;Energy conservation;Patient/family education    OT Goals(Current goals can be found in the care plan section) Acute Rehab OT Goals Patient Stated Goal: to get his pain controlled and get stronger OT Goal Formulation: With patient Time For Goal Achievement: 08/04/17 Potential to Achieve Goals: Good  OT Frequency: Min 2X/week                             AM-PAC PT "6 Clicks" Daily Activity     Outcome Measure Help from another person eating meals?: A Little Help from another person taking care of personal grooming?: A Little Help from another person toileting, which includes using toliet, bedpan, or urinal?: Total Help from another person bathing (including washing, rinsing, drying)?: A Lot Help from another person to put on and taking off regular upper body clothing?: Total Help from another person to put on and taking off regular lower body clothing?: Total 6 Click Score: 11   End of Session Nurse Communication: Mobility status  Activity Tolerance: Patient limited by pain Patient left: in bed;with call bell/phone within reach;with bed alarm set  OT Visit Diagnosis: Pain Pain - Right/Left: Right(bil (R>L)) Pain - part of body: Leg(back)                Time: [id number] OT Time Calculation (min): 13 min Charges:  OT General Charges $OT Visit: 1 Visit OT Evaluation $OT Eval Moderate Complexity: 1 Mod G-Codes:     Marcy SirenBreanna Ahnika Hannibal, OT Pager [phone] 07/21/2017  Orlando PennerBreanna L Datrell Dunton 07/21/2017, 1:26 PM

## 2017-07-21 NOTE — Progress Notes (Signed)
Preliminary notes by tech--Bilateral lower extremities venous duplex study completed. Negative for DVT. No popliteal fossa cyst seen.   Hongying Jossue Rubenstein(RDMS RVT) 07/21/17 2:56 PM

## 2017-07-21 NOTE — Progress Notes (Signed)
Rehab Admissions Coordinator Note:  Patient was screened by Clois DupesBoyette, Keena Heesch Godwin for appropriateness for an Inpatient Acute Rehab Consult PT recommendation.   At this time, we are recommending await  further medical workup before determining dispo options..Unable to transfer due to pain . I will follow.  Clois DupesBoyette, Lei Dower Godwin 07/21/2017, 10:29 AM  I can be reached at [phone].

## 2017-07-21 NOTE — [place] FL2 (Signed)
[place] MEDICAID FL2 LEVEL OF CARE SCREENING TOOL     IDENTIFICATION  Patient Name: Trevor Nunez Birthdate: 31-Dec-1965 Sex: male Admission Date (Current Location): 07/20/2017  [facility] [place] [place]   Facility and Address:  [facility], [address], [place], [place] [address]      Provider Number: [id number]  Attending Physician Name and Address:  Latrelle DodrillMcIntyre, Brittany J, MD  Relative Name and Phone Number:       Current Level of Care: Hospital Recommended Level of Care: Skilled Nursing Facility Prior Approval Number:    Date Approved/Denied:   PASRR Number: [id number]  Discharge Plan: SNF    Current Diagnoses: Patient Active Problem List   Diagnosis Date Noted  . Bilateral leg weakness 07/20/2017  . Foot pain, right 05/07/2017  . Leg pain 01/27/2017  . Obesity 11/16/2016  . HTN (hypertension) 11/08/2016  . Chronic diastolic heart failure (HCC) 06/25/2016  . Snoring 06/25/2016  . CKD (chronic kidney disease), stage III (HCC) 06/15/2016  . Demand ischemia (HCC) 06/15/2016  . Pain and swelling of elbow   . Acute on chronic diastolic CHF (congestive heart failure) (HCC)   . H/O medication noncompliance   . Chest pain with moderate risk for cardiac etiology 10/17/2015  . Malignant hypertension 10/17/2015  . AKI (acute kidney injury) (HCC) 10/17/2015    Orientation RESPIRATION BLADDER Height & Weight     Self, Time, Situation, Place  Normal Continent Weight: (!) 138.2 kg (304 lb 10.8 oz) Height:  6' (182.9 cm)  BEHAVIORAL SYMPTOMS/MOOD NEUROLOGICAL BOWEL NUTRITION STATUS      Continent Diet(Please see DC Summary)  AMBULATORY STATUS COMMUNICATION OF NEEDS Skin   Limited Assist Verbally Normal                       Personal Care Assistance Level of Assistance  Bathing, Feeding, Dressing Bathing Assistance: Limited assistance Feeding assistance: Independent Dressing Assistance: Limited assistance     Functional  Limitations Info             SPECIAL CARE FACTORS FREQUENCY  PT (By licensed PT)     PT Frequency: 5x/week              Contractures      Additional Factors Info  Code Status, Allergies Code Status Info: Full Allergies Info: Codeine           Current Medications (07/21/2017):  This is the current hospital active medication list Current Facility-Administered Medications  Medication Dose Route Frequency Provider Last Rate Last Dose  . acetaminophen (TYLENOL) tablet 650 mg  650 mg Oral Q6H PRN Berton BonMikell, Asiyah Zahra, MD   650 mg at 07/21/17 0601   Or  . acetaminophen (TYLENOL) suppository 650 mg  650 mg Rectal Q6H PRN Berton BonMikell, Asiyah Zahra, MD      . allopurinol (ZYLOPRIM) tablet 100 mg  100 mg Oral Daily Berton BonMikell, Asiyah Zahra, MD   100 mg at 07/21/17 0840  . aspirin chewable tablet 81 mg  81 mg Oral Daily Berton BonMikell, Asiyah Zahra, MD   81 mg at 07/21/17 0836  . carvedilol (COREG) tablet 12.5 mg  12.5 mg Oral BID WC Mikell, Antionette PolesAsiyah Zahra, MD   12.5 mg at 07/21/17 0836  . enoxaparin (LOVENOX) injection 40 mg  40 mg Subcutaneous Q24H Berton BonMikell, Asiyah Zahra, MD   40 mg at 07/20/17 2338  . gabapentin (NEURONTIN) capsule 200 mg  200 mg Oral TID Berton BonMikell, Asiyah Zahra, MD  200 mg at 07/21/17 0837  . hydrALAZINE (APRESOLINE) tablet 50 mg  50 mg Oral TID Berton Bon, MD   50 mg at 07/21/17 0836  . spironolactone (ALDACTONE) tablet 25 mg  25 mg Oral Daily Berton Bon, MD   25 mg at 07/21/17 0836  . torsemide (DEMADEX) tablet 60 mg  60 mg Oral TID Berton Bon, MD   60 mg at 07/21/17 6295     Discharge Medications: Please see discharge summary for a list of discharge medications.  Relevant Imaging Results:  Relevant Lab Results:   Additional Information SSN: [id number] [place], [place]

## 2017-07-21 NOTE — Progress Notes (Signed)
  Echocardiogram 2D Echocardiogram has been performed.  Delcie RochENNINGTON, [place] Natarajan 07/21/2017, 1:01 PM

## 2017-07-21 NOTE — Progress Notes (Signed)
Pt has declined the CPAP tonight stating that he does not wear one at home. The patient states that he has had a sleep study and has OSA but has not been sent a CPAP at this time. The patient was offered a CPAP through the hospital but declined.

## 2017-07-21 NOTE — Plan of Care (Signed)

## 2017-07-21 NOTE — Consult Note (Addendum)
NEURO HOSPITALIST CONSULT NOTE   Requestig physician: Family medicine practice   Reason for Consult: Bilateral leg weakness and numbness   History obtained from:  Patient     HPI:                                                                                                                                          Trevor Nunez is an 52 y.o. male with history of diastolic heart failure, chronic kidney disease, hypertension.  Patient states that 2 weeks ago he got out of bed and noticed a significant pain on the anterior lateral aspect of his left leg.  This caused him to fall and injure his right arm.  From that day on he noted he had progressive weakness in both legs.  He noted that the weakness felt as though it was starting in his distal aspect of his legs and soon over period of 2 weeks Incorporated his quadriceps.  In addition he did not notice any decrease in sensation but did notice that bilateral feet felt as though they were burning and very sensitive to touch.  He also noticed a large mass on the superior aspect of his patella on the right leg that was not previously there.  At this point he has no significant pain to light touch on his legs however he feels that when touching his muscles of his calf and quadriceps he has a deep pain in any movement of his legs especially his right leg causes significant pain.  Since last Tuesday which was 2 days prior to today, he has not been able to walk.  His wife states that he attempted to walk with a walker but his upper body move forward yet his lower body was almost stuck to the rug.  Past Medical History:  Diagnosis Date  . Chest pain    a. In setting of HTN Urgency w/ trop elevation-->05/2016 MV: no ischemia-->low risk.  . Chronic diastolic CHF (congestive heart failure) (HCC)    a. 10/2015 Echo: EF 55-60%, no rwma; b. 05/2016 Echo: EF 65-70%, sev LVH, Gr2 DD, triv AI, mildly dil LA, nl RV size/fxn, mildly-mod dil RA.  . CKD  (chronic kidney disease), stage III (HCC)   . Dilated aortic root (HCC)    a. 10/2015 Echo: Ao root 41mm;  b. 05/2016 Echo: triv AI.  Marland Kitchen. Elevated troponin    a. 10/2015, 05/2016, & 10/2016 in the setting of HTN Urgency;  b. 05/2016 MV: no ischemia-->Low risk.  . H/O medication noncompliance   . Hypertension     Past Surgical History:  Procedure Laterality Date  . CHOLECYSTECTOMY    . HERNIA REPAIR      Family History  Problem Relation Age of Onset  . Hypertension Mother   .  Heart attack Mother   . CVA Mother   . Heart disease Sister               Social History:  reports that he quit smoking about 23 months ago. He has never used smokeless tobacco. He reports that he does not drink alcohol or use drugs.  Allergies  Allergen Reactions  . Codeine Palpitations    MEDICATIONS:                                                                                                                     Prior to Admission:  Medications Prior to Admission  Medication Sig Dispense Refill Last Dose  . aspirin 81 MG chewable tablet Chew 1 tablet (81 mg total) by mouth daily. 90 tablet 3 07/20/2017 at Unknown time  . carvedilol (COREG) 12.5 MG tablet Take 12.5 mg by mouth 2 (two) times daily with a meal.   07/20/2017 at 8a  . hydrALAZINE (APRESOLINE) 50 MG tablet Take 50 mg by mouth 3 (three) times daily.   07/20/2017 at Unknown time  . potassium chloride SA (K-DUR,KLOR-CON) 20 MEQ tablet Take 1 tablet (20 mEq total) by mouth 2 (two) times daily. And take extra when taking metolazone 180 tablet 3 07/20/2017 at Unknown time  . spironolactone (ALDACTONE) 25 MG tablet Take 25 mg by mouth daily.   07/20/2017 at Unknown time  . torsemide (DEMADEX) 20 MG tablet Take 60 mg by mouth 3 (three) times daily.    07/20/2017 at Unknown time  . allopurinol (ZYLOPRIM) 100 MG tablet Take 1 tablet (100 mg total) by mouth daily. (Patient not taking: Reported on 07/20/2017) 30 tablet 0 Not Taking at Unknown time  . cyclobenzaprine  (FLEXERIL) 5 MG tablet Take 1-2 tablets 3 times daily as needed (Patient not taking: Reported on 07/20/2017) 20 tablet 0 Not Taking at Unknown time  . lidocaine (LIDODERM) 5 % Place 1 patch onto the skin every 12 (twelve) hours. Remove & Discard patch within 12 hours or as directed by MD (Patient not taking: Reported on 07/20/2017) 10 patch 0 did not start at Unknown time  . metolazone (ZAROXOLYN) 2.5 MG tablet Take 1 tablet (2.5 mg total) by mouth daily. (Patient not taking: Reported on 07/20/2017) 3 tablet 0 Not Taking at Unknown time  . nitroGLYCERIN (NITROSTAT) 0.4 MG SL tablet Place 1 tablet (0.4 mg total) under the tongue every 5 (five) minutes as needed for chest pain. 20 tablet 1 unk  . oxyCODONE-acetaminophen (PERCOCET) 5-325 MG tablet Take 1 tablet by mouth every 4 (four) hours as needed for severe pain. 6 tablet 0 did not fill   Scheduled: . allopurinol  100 mg Oral Daily  . aspirin  81 mg Oral Daily  . carvedilol  12.5 mg Oral BID WC  . enoxaparin (LOVENOX) injection  40 mg Subcutaneous Q24H  . gabapentin  200 mg Oral TID  . hydrALAZINE  50 mg Oral TID  . spironolactone  25 mg Oral Daily  . torsemide  60 mg Oral TID   Continuous:  UVO:[id number] **OR** acetaminophen   ROS:                                                                                                                                       History obtained from the patient  General ROS: negative for - chills, fatigue, fever, night sweats, weight gain or weight loss Psychological ROS: negative for - behavioral disorder, hallucinations, memory difficulties, mood swings or suicidal ideation Ophthalmic ROS: negative for - blurry vision, double vision, eye pain or loss of vision ENT ROS: negative for - epistaxis, nasal discharge, oral lesions, sore throat, tinnitus or vertigo Allergy and Immunology ROS: negative for - hives or itchy/watery eyes Hematological and Lymphatic ROS: negative for - bleeding problems, bruising  or swollen lymph nodes Endocrine ROS: negative for - galactorrhea, hair pattern changes, polydipsia/polyuria or temperature intolerance Respiratory ROS: Positive for -  shortness of breath -chronic Cardiovascular ROS: negative for - chest pain, dyspnea on exertion, edema or irregular heartbeat Gastrointestinal ROS: negative for - abdominal pain, diarrhea, hematemesis, nausea/vomiting or stool incontinence Genito-Urinary ROS: negative for - dysuria, hematuria, incontinence or urinary frequency/urgency Musculoskeletal ROS: Positive for - joint swelling or muscular weakness Neurological ROS: as noted in HPI Dermatological ROS: negative for rash and skin lesion changes   Blood pressure 111/67, pulse 83, temperature 98.4 F (36.9 C), temperature source Oral, resp. rate 18, height 6' (1.829 m), weight (!) 138.2 kg (304 lb 10.8 oz), SpO2 100 %.   General Examination:                                                                                                       Physical Exam  HEENT-  Normocephalic, no lesions, without obvious abnormality.  Normal external eye and conjunctiva.   Cardiovascular- S1-S2 audible, pulses palpable throughout   Lungs-no rhonchi or wheezing noted, no excessive working breathing.  Saturations within normal limits Abdomen- All 4 quadrants palpated and nontender Extremities- Warm, dry and intact Musculoskeletal-positive joint tenderness, deformity or swelling--tenderness in bilateral legs with deformity in the right region superior to the patella Skin-warm and dry, with edema in his lower extremities  Neurological Examination Mental Status: Alert, oriented, thought content appropriate.  Speech fluent without evidence of aphasia.  Able to follow 3 step commands without difficulty. Cranial Nerves: II:  Visual fields grossly normal,  III,IV, VI: ptosis not present, extra-ocular motions intact bilaterally pupils equal, round, reactive to light and accommodation V,VII:  smile symmetric, facial light touch sensation normal bilaterally VIII: hearing normal bilaterally IX,X: uvula rises symmetrically XI: bilateral shoulder shrug XII: midline tongue extension Motor: Bilateral upper extremities 5 out of 5 with full range of motion.  Right leg has significant pain, unable to lift off the bed, deformity superior to his patella, dorsi flexion and plantar flexion 5/5.  Left leg he is able to lift off the bed and keep up for 5 seconds with 5/5 strength.  Both legs are limited by pain. Sensory: Decreased temperature sensation up to the knee, decreased pinprick up to the knee, intact vibratory sensation and proprioception Deep Tendon Reflexes: 2+ and symmetric throughout upper extremities and at the patella.  No ankle jerk on the right but present on the left Plantars: Right: downgoing   Left: downgoing Cerebellar: normal finger-to-nose, unable to do heel to shin secondary to pain Gait: Did not test   Lab Results: Basic Metabolic Panel: Recent Labs  Lab 07/20/17 1800 07/21/17 0420  NA 135 135  K 3.4* 3.5  CL 98* 96*  CO2 23 27  GLUCOSE 123* 142*  BUN 23* 25*  CREATININE 2.19* 2.18*  CALCIUM 8.9 8.6*    CBC: Recent Labs  Lab 07/20/17 1800 07/21/17 0420  WBC 10.2 9.5  NEUTROABS 7.3  --   HGB 13.8 14.0  HCT 41.4 42.9  MCV 89.2 90.5  PLT 308 310    Cardiac Enzymes: Recent Labs  Lab 07/20/17 2306 07/21/17 0420 07/21/17 0839  CKTOTAL 436*  --   --   TROPONINI 0.07* 0.08* 0.06*    Lipid Panel: No results for input(s): CHOL, TRIG, HDL, CHOLHDL, VLDL, LDLCALC in the last 168 hours.  Imaging: Mr Brain Wo Contrast  Result Date: 07/20/2017 CLINICAL DATA:  52 y/o  M; bilateral lower extremity weakness. EXAM: MRI HEAD WITHOUT CONTRAST TECHNIQUE: Multiplanar, multiecho pulse sequences of the brain and surrounding structures were obtained without intravenous contrast. COMPARISON:  10/17/2015 CT of the head. FINDINGS: Brain: No acute infarction,  hemorrhage, hydrocephalus, extra-axial collection or mass lesion. Fewnonspecific foci of T2 FLAIR hyperintense signal abnormality in subcortical and periventricular white matter are compatible withmildchronic microvascular ischemic changes for age. Mildbrain parenchymal volume loss. Vascular: Normal flow voids. Skull and upper cervical spine: Normal marrow signal. Sinuses/Orbits: Negative. Other: None. IMPRESSION: 1. No acute intracranial abnormality identified. 2. Mild chronic microvascular ischemic changes and mild parenchymal volume loss of the brain. Electronically Signed   By: Mitzi Hansen M.D.   On: 07/20/2017 22:07   Mr Lumbar Spine Wo Contrast  Result Date: 07/20/2017 CLINICAL DATA:  53 y/o M; severe leg pain with activity and numbness in the left leg. EXAM: MRI LUMBAR SPINE WITHOUT CONTRAST TECHNIQUE: Multiplanar, multisequence MR imaging of the lumbar spine was performed. No intravenous contrast was administered. COMPARISON:  None. FINDINGS: Segmentation:  Standard. Alignment:  Physiologic. Vertebrae: Diffusely heterogeneous and decreased signal within bone marrow on T1 and T2 weighted sequences. No bone marrow or disc edema. Conus medullaris and cauda equina: Conus extends to the L1 level. Conus and cauda equina appear normal. Paraspinal and other soft tissues: Negative. Disc levels: L1-2: No significant disc displacement, foraminal stenosis, or canal stenosis. L2-3: No significant disc displacement, foraminal stenosis, or canal stenosis. L3-4: No significant disc displacement, foraminal stenosis, or canal stenosis. L4-5: Small disc bulge and mild facet hypertrophy. Mild bilateral foraminal stenosis. No canal stenosis. L5-S1: Small disc bulge with left foraminal marginal osteophytes and left-greater-than-right facet hypertrophy. Mild right and moderate left foraminal stenosis. No canal  stenosis. IMPRESSION: 1. Lumbar spondylosis predominantly at L4-5 and L5-S1 levels. 2. Mild bilateral L4-5  foraminal stenosis as well as mild right and moderate left L5-S1 foraminal stenosis. 3. No significant canal stenosis. 4. Diffuse heterogeneous and decreased bone marrow signal probably represents marrow reconversion in the setting of heart failure and chronic kidney disease. Iron deposition or myelofibrosis is also possible. Electronically Signed   By: Mitzi Hansen M.D.   On: 07/20/2017 17:44   Dg Chest Port 1 View  Result Date: 07/20/2017 CLINICAL DATA:  Shortness of breath, pain in both legs EXAM: PORTABLE CHEST 1 VIEW COMPARISON:  11/16/2016 FINDINGS: The heart size and mediastinal contours are within normal limits. Both lungs are clear. The visualized skeletal structures are unremarkable. IMPRESSION: No active disease. Electronically Signed   By: Elige Ko   On: 07/20/2017 16:04    Assessment and plan per attending neurologist  Felicie Morn PA-C [facility] [phone]  07/21/2017, 4:31 PM   Assessment/Plan: 52 year old male with a history of 2 weeks of progressive pain.  He does have significant edema, and sometimes this can be painful but it is not common to present the way he is presenting.  He appears to have decreased sensation to pinprick and temperature which could suggest a small fiber neuropathy versus lesions of the anterolateral spinal tracts.  I agree with getting imaging of his cervical and thoracic spine.  The presence of reflexes would argue against typical Guillain-Barr syndrome, but there has been suggestion that there are small fiber variants of this disease.  One other possibility of a neuropathy with preserved reflexes would be a myeloneuropathy such as copper or B12 deficiency.  Of note, at least in his left leg confrontational strength testing is not that bad, and in his right leg it really  seems to be more limited by pain.  1) MRI C/T-spine 2) may need to consider LP if no lesions on MRI 3) B12, copper 4) agree with gabapentin, would  consider increasing dose 5) I would consider imaging his right knee given the abnormal appearance above it.  Ritta Slot, MD [facility] [phone]  If 7pm- 7am, please page neurology on call as listed in AMION.

## 2017-07-21 NOTE — Progress Notes (Signed)
Pt arrived on unit in no acute distress. Right arm in soft cast.  Pt reported that he fell at home prior to admission.  Alert and oriented X 4.  Welcome and introduction to unit completed.  Vital sign, assessment, and telemetry placement completed.  He is in bed resting.  Will continue to monitor.

## 2017-07-22 ENCOUNTER — Other Ambulatory Visit (HOSPITAL_COMMUNITY): Payer: Medicaid Other

## 2017-07-22 ENCOUNTER — Inpatient Hospital Stay (HOSPITAL_COMMUNITY): Payer: Medicaid Other

## 2017-07-22 LAB — BASIC METABOLIC PANEL
Anion gap: 16 — ABNORMAL HIGH (ref 5–15)
BUN: 37 mg/dL — ABNORMAL HIGH (ref 6–20)
CHLORIDE: 94 mmol/L — AB (ref 101–111)
CO2: 25 mmol/L (ref 22–32)
CREATININE: 2.49 mg/dL — AB (ref 0.61–1.24)
Calcium: 8.5 mg/dL — ABNORMAL LOW (ref 8.9–10.3)
GFR calc non Af Amer: 28 mL/min — ABNORMAL LOW (ref >60)
GFR, EST AFRICAN AMERICAN: 32 mL/min — AB (ref >60)
GLUCOSE: 144 mg/dL — AB (ref 65–99)
Potassium: 3.8 mmol/L (ref 3.5–5.1)
Sodium: 135 mmol/L (ref 135–145)

## 2017-07-22 LAB — CK: CK TOTAL: 341 U/L (ref 49–397)

## 2017-07-22 LAB — CREATININE, URINE, RANDOM: Creatinine, Urine: 71.28 mg/dL

## 2017-07-22 MED ORDER — POTASSIUM CHLORIDE CRYS ER 20 MEQ PO TBCR
40.0000 meq | EXTENDED_RELEASE_TABLET | ORAL | Status: DC
  Administered 2017-07-22: 40 meq via ORAL
  Filled 2017-07-22: qty 2

## 2017-07-22 MED ORDER — POTASSIUM CHLORIDE CRYS ER 20 MEQ PO TBCR
40.0000 meq | EXTENDED_RELEASE_TABLET | Freq: Once | ORAL | Status: AC
  Administered 2017-07-22: 40 meq via ORAL

## 2017-07-22 MED ORDER — ENOXAPARIN SODIUM 80 MG/0.8ML ~~LOC~~ SOLN: 70.0000 mg | SUBCUTANEOUS | Status: DC

## 2017-07-22 MED ORDER — OXYCODONE HCL 5 MG PO TABS
5.0000 mg | ORAL_TABLET | Freq: Once | ORAL | Status: AC | PRN
  Administered 2017-07-22: 5 mg via ORAL
  Filled 2017-07-22: qty 1

## 2017-07-22 NOTE — Progress Notes (Signed)
PT Cancellation Note  Patient Details Name: Trevor Nunez MRN: [id number] DOB: 1966-01-11   Cancelled Treatment:    Reason Eval/Treat Not Completed: Other (comment).  Two attempts to see pt and then gave up.  Will see in the AM to work on bed mob and not the pain management.  Pt is able to assist with mobility and will progress this to standing out of bed tomorrow if possible.     Ivar DrapeRuth E Virdell Hoiland 07/22/2017, 9:41 PM   Samul Dadauth Jurgen Groeneveld, PT MS Acute Rehab Dept. Number: [facility] [phone] and [facility] [phone]

## 2017-07-22 NOTE — Progress Notes (Signed)
Family Medicine Teaching Service Daily Progress Note Intern Pager: [phone]  Patient name: Trevor Nunez Medical record number: [id number] Date of birth: 02/01/1966 Age: 52 y.o. Gender: male  Primary Care Provider: Tracie Harrier, MD Consultants: None Code Status: Full  Pt Overview and Major Events to Date:  4/3 - admitted for bilateral LE weakness/pain.  Assessment and Plan: Trevor Nunez is a 52 y.o. male presenting with bilateral lower extremity weakness/pain. PMH is significant for HTN, CKD, HFpEF, morbid obesity.  Bilateral LE weakness and pain: Pain and weakness persists. Pain with light palpation. ABIs obtain which were limited due to pain and bandaging of RUE, L ABI was wnl. LE dopplers negative for DVT. ESR and CRP elevated at 65 and 18.6, respectively. MRI C/T showed neural foraminal narrowing C3-4 through C6-7 with moderate to severe on the RIGHT at C4-5. Also with "mild paraspinal muscle atrophy, disproportionate to remainder of included musculature. Though nonspecific, spinal muscular atrophy/muscular dystrophy could have this appearance. If symptoms persist, histopathologic sampling may be indicated." Knee MRI showed large joint effusion with synovitis, possible small amount of hemorrhage, can consider consider arthrocentesis. Small amount of edema within the distal vastus lateralis muscle was visualized that could reflect strain. Neurology consulted who recommended further lab work up with copper level and Anti-Jo antibody. - PT/OT - CIR - RPR pending - HIV pending - gabapentin 200 mg TID, can increase dose per neuro - neurology consulted, appreciate recommendations. - copper, Anti-Jo 1 Ab pending   HFpEF: repeat ECHO EF 65-70%, with moderate diastolic dysfunction. BNP 67.3 at admission. Diuresed well yesterday on home torsemide, outpt 1.2L yesterday. Continues on home torsemide 60 mg TID and spironolactone daily. No pulmonary edema appreciated on exam. No weights recorded yet  for today.    - continue home torsemide  - strict I's and O's - daily weights  AoCKD: Baseline 1.8-2.0, SCr 2.19>2.23 yesterday evening despite diuresis with only home torsemide dose. UA notable for protein 100, hyaline, granular. and amorphous crystal casts. Possible he could have slight muscle breakdown from not moving much the past few weeks. No Hb on U/A and only mildly elevated CK so rhabdomyolysis less likely. - daily BMP - replenish K as needed - 2.9 this am, will receive 82mq x2 today. Repeat pm BMP. - Fe Urea - repeat CK - renal U/S  HTN: Normotensive overnight.  Home regimen includes Coreg 12.5 mg BID, hydralazine 50 mg TID, Torsemide 60 mg TID. - continue home medications  H/o gout Previously on allopurinol. Uric acid elevated at 13.4. R knee warm and swollen, could be gout related. - continue allopurinol.  R arm injury s/p fall: Currently in sugar tong cast.  Patient says he was told that it is possibly that he had a fracture though none noted on initial ED xrays, would need follow up with orthopedics. Appointment supposed to be on 4/4. -  Will need to follow up with orthopedics outpatient   OSA: Recently diagnosis of OSA in February, still waiting on a CPAP machine at home  - CPAP at night, refusing   FEN/GI: heart healthy diet Prophylaxis: lovenox  Disposition: continue inpatient work up of bilateral LE weakness and pain  Subjective:  Patient states pain is the same as yesterday.  Objective: Temp:  [98.4 F (36.9 C)-99 F (37.2 C)] 99 F (37.2 C) (04/04 2130) Pulse Rate:  [78-89] 82 (04/05 0931) Resp:  [18] 18 (04/04 2130) BP: (111-119)/(67-89) 111/89 (04/05 0931) SpO2:  [93 %-100 %] 93 % (04/04 2130) Physical Exam:  General: pleasant male, in NAD, lying in bed Cardiovascular: RRR, no murmur Respiratory: CTAB, no wheezes/rales/rhonchi Abdomen: obese abdomen, nontender to palpation, +BS MSK: Strength 5/5 to LLE, 3/5 RLE, 5/5 UE bilaterally (LE limited  by pain). 1+ pitting edema to LE R>L. Neuro: Alert and oriented, speech normal. Optic field normal. Extraocular movements intact. Intact symmetric sensation to light touch of face and extremities bilaterally.  Hearing grossly intact bilaterally. Tongue protrudes normally with no deviation.  Shoulder shrug, smile symmetric.   Laboratory: Recent Labs  Lab 07/20/17 1800 07/21/17 0420  WBC 10.2 9.5  HGB 13.8 14.0  HCT 41.4 42.9  PLT 308 310   Recent Labs  Lab 07/20/17 1800 07/21/17 0420 07/21/17 2009  NA 135 135 137  K 3.4* 3.5 2.9*  CL 98* 96* 93*  CO2 _0 BUN 23* 25* 45*  CREATININE 2.19* 2.18* 2.23*  CALCIUM 8.9 8.6* 9.3  PROT  --   --  7.2  BILITOT  --   --  0.9  ALKPHOS  --   --  94  ALT  --   --  29  AST  --   --  34  GLUCOSE 123* 142* 217*   Imaging/Diagnostic Tests: Dg Elbow Complete Right (3+view)  Result Date: 07/21/2017 CLINICAL DATA:  Elbow pain after fall 4 days ago. EXAM: RIGHT ELBOW - COMPLETE 3+ VIEW COMPARISON:  Right elbow x-rays dated July 16, 2017. FINDINGS: Persistent elbow joint effusion. No discrete fracture identified. No dislocation. Joint spaces are preserved. Bone mineralization is normal. Triceps enthesopathy. Soft tissues are unremarkable. IMPRESSION: 1. Unchanged elbow joint effusion, suspicious for occult fracture given history of trauma. Electronically Signed   By: Titus Dubin M.D.   On: 07/21/2017 17:22   Mr Cervical Spine Wo Contrast  Result Date: 07/21/2017 CLINICAL DATA:  Severe LEFT leg pain 2 weeks ago resulting in fall, and RIGHT arm injury. Progressive lower extremity weakness. Assess myelopathy. History of chronic kidney disease, hypertension, heart failure. EXAM: MRI CERVICAL, THORACIC AND LUMBAR SPINE WITHOUT CONTRAST TECHNIQUE: Multiplanar and multiecho pulse sequences of the cervical spine, to include the craniocervical junction and cervicothoracic junction, and thoracic, were obtained without intravenous contrast. COMPARISON:   MRI of the head July 20, 2017 and MRI of the lumbar spine July 20, 2017 FINDINGS: MRI CERVICAL SPINE FINDINGS-mild motion degraded examination. ALIGNMENT: Maintained cervical lordosis.  No malalignment. VERTEBRAE/DISCS: Vertebral bodies are intact. Intervertebral disc morphology's and signal are normal. Multilevel mild chronic discogenic endplate changes lower cervical spine without acute or abnormal bone marrow signal. Subcentimeter probable hemangioma ventral C2. CORD:Cervical spinal cord is normal morphology and signal characteristics from the cervicomedullary junction to level of T2-3, the most caudal well visualized level. POSTERIOR FOSSA, VERTEBRAL ARTERIES, PARASPINAL TISSUES: No MR findings of ligamentous injury. Vertebral artery flow voids present. Included posterior fossa and paraspinal soft tissues are nonacute. Mild symmetric paraspinal muscle atrophy, disproportionate to the degree of trapezius in remaining muscles. Posterior fossa arachnoid cyst. DISC LEVELS: C2-3: Annular bulging, uncovertebral hypertrophy. No canal stenosis or neural foraminal narrowing. C3-4: Uncovertebral hypertrophy without canal stenosis. Mild RIGHT neural foraminal narrowing. C4-5: RIGHT uncovertebral hypertrophy and mild RIGHT greater than LEFT facet arthropathy. No canal stenosis. Moderate to severe RIGHT neural foraminal narrowing. C5-6: Small broad-based disc bulge, uncovertebral hypertrophy. Minimal RIGHT, moderate LEFT facet arthropathy. No canal stenosis. Moderate RIGHT, mild LEFT neural foraminal narrowing. C6-7: Small central disc protrusion. No canal stenosis. Minimal RIGHT neural foraminal narrowing. C7-T1: No disc bulge, canal stenosis nor neural  foraminal narrowing. MRI THORACIC SPINE FINDINGS-Mild motion degraded examination. ALIGNMENT: Maintenance of the thoracic kyphosis. No malalignment. VERTEBRAE/DISCS: Vertebral bodies are intact. Multilevel mild degenerative discs associated with desiccation mild chronic  discogenic endplate changes. No acute or abnormal bone marrow signal. CORD: Thoracic spinal cord is normal morphology and signal characteristics to the level of the conus medullaris which terminates at T12-L1. PREVERTEBRAL AND PARASPINAL SOFT TISSUES: Moderate symmetric paraspinal muscle atrophy (multifidus, longissimus) solid normalizing toward the lumbar spine. Normal appearance the trapezius. 9 mm T2 bright cyst at T9-10 paraspinal soft tissues, nonspecific. DISC LEVELS: No significant disc bulge, canal stenosis or neural foraminal narrowing IMPRESSION: MRI cervical spine: 1. No fracture, malalignment or abnormal spinal cord signal on this motion degraded examination. 2. No canal stenosis. Neural foraminal narrowing C3-4 through C6-7: Moderate to severe on the RIGHT at C4-5. 3. Mild paraspinal muscle atrophy, disproportionate to remainder of included musculature. Though nonspecific, spinal muscular atrophy/muscular dystrophy could have this appearance. MRI thoracic spine: 1. No fracture, malalignment or abnormal spinal cord signal on this motion degraded examination. 2. No canal stenosis or neural foraminal narrowing. 3. Mild paraspinal muscle atrophy, disproportionate to remainder of included musculature. Though nonspecific, spinal muscular atrophy/muscular dystrophy could have this appearance. If symptoms persist, histopathologic sampling may be indicated. Electronically Signed   By: Elon Alas M.D.   On: 07/21/2017 20:26   Mr Thoracic Spine Wo Contrast  Result Date: 07/21/2017 CLINICAL DATA:  Severe LEFT leg pain 2 weeks ago resulting in fall, and RIGHT arm injury. Progressive lower extremity weakness. Assess myelopathy. History of chronic kidney disease, hypertension, heart failure. EXAM: MRI CERVICAL, THORACIC AND LUMBAR SPINE WITHOUT CONTRAST TECHNIQUE: Multiplanar and multiecho pulse sequences of the cervical spine, to include the craniocervical junction and cervicothoracic junction, and  thoracic, were obtained without intravenous contrast. COMPARISON:  MRI of the head July 20, 2017 and MRI of the lumbar spine July 20, 2017 FINDINGS: MRI CERVICAL SPINE FINDINGS-mild motion degraded examination. ALIGNMENT: Maintained cervical lordosis.  No malalignment. VERTEBRAE/DISCS: Vertebral bodies are intact. Intervertebral disc morphology's and signal are normal. Multilevel mild chronic discogenic endplate changes lower cervical spine without acute or abnormal bone marrow signal. Subcentimeter probable hemangioma ventral C2. CORD:Cervical spinal cord is normal morphology and signal characteristics from the cervicomedullary junction to level of T2-3, the most caudal well visualized level. POSTERIOR FOSSA, VERTEBRAL ARTERIES, PARASPINAL TISSUES: No MR findings of ligamentous injury. Vertebral artery flow voids present. Included posterior fossa and paraspinal soft tissues are nonacute. Mild symmetric paraspinal muscle atrophy, disproportionate to the degree of trapezius in remaining muscles. Posterior fossa arachnoid cyst. DISC LEVELS: C2-3: Annular bulging, uncovertebral hypertrophy. No canal stenosis or neural foraminal narrowing. C3-4: Uncovertebral hypertrophy without canal stenosis. Mild RIGHT neural foraminal narrowing. C4-5: RIGHT uncovertebral hypertrophy and mild RIGHT greater than LEFT facet arthropathy. No canal stenosis. Moderate to severe RIGHT neural foraminal narrowing. C5-6: Small broad-based disc bulge, uncovertebral hypertrophy. Minimal RIGHT, moderate LEFT facet arthropathy. No canal stenosis. Moderate RIGHT, mild LEFT neural foraminal narrowing. C6-7: Small central disc protrusion. No canal stenosis. Minimal RIGHT neural foraminal narrowing. C7-T1: No disc bulge, canal stenosis nor neural foraminal narrowing. MRI THORACIC SPINE FINDINGS-Mild motion degraded examination. ALIGNMENT: Maintenance of the thoracic kyphosis. No malalignment. VERTEBRAE/DISCS: Vertebral bodies are intact. Multilevel  mild degenerative discs associated with desiccation mild chronic discogenic endplate changes. No acute or abnormal bone marrow signal. CORD: Thoracic spinal cord is normal morphology and signal characteristics to the level of the conus medullaris which terminates at T12-L1. PREVERTEBRAL AND PARASPINAL  SOFT TISSUES: Moderate symmetric paraspinal muscle atrophy (multifidus, longissimus) solid normalizing toward the lumbar spine. Normal appearance the trapezius. 9 mm T2 bright cyst at T9-10 paraspinal soft tissues, nonspecific. DISC LEVELS: No significant disc bulge, canal stenosis or neural foraminal narrowing IMPRESSION: MRI cervical spine: 1. No fracture, malalignment or abnormal spinal cord signal on this motion degraded examination. 2. No canal stenosis. Neural foraminal narrowing C3-4 through C6-7: Moderate to severe on the RIGHT at C4-5. 3. Mild paraspinal muscle atrophy, disproportionate to remainder of included musculature. Though nonspecific, spinal muscular atrophy/muscular dystrophy could have this appearance. MRI thoracic spine: 1. No fracture, malalignment or abnormal spinal cord signal on this motion degraded examination. 2. No canal stenosis or neural foraminal narrowing. 3. Mild paraspinal muscle atrophy, disproportionate to remainder of included musculature. Though nonspecific, spinal muscular atrophy/muscular dystrophy could have this appearance. If symptoms persist, histopathologic sampling may be indicated. Electronically Signed   By: Elon Alas M.D.   On: 07/21/2017 20:26   Mr Knee Right Wo Contrast  Result Date: 07/21/2017 CLINICAL DATA:  Acute anterior knee pain for the past 2 weeks. No known injury. EXAM: MRI OF THE RIGHT KNEE WITHOUT CONTRAST TECHNIQUE: Multiplanar, multisequence MR imaging of the knee was performed. No intravenous contrast was administered. COMPARISON:  None. FINDINGS: Despite efforts by the technologist and patient, motion artifact is present on today's exam and  could not be eliminated. This reduces exam sensitivity and specificity. MENISCI Medial meniscus:  Blunting of the body free edge. Lateral meniscus:  Intact. LIGAMENTS Cruciates:  Intact ACL and PCL. Collaterals: Medial collateral ligament is intact. Lateral collateral ligament complex is intact. CARTILAGE Patellofemoral: High-grade near full-thickness cartilage loss over the patellar apex extending into the medial facet, with underlying subchondral cystic change. Medial: High-grade near full-thickness cartilage loss over the central weight-bearing medial femoral condyle and medial tibial plateau with underlying subchondral marrow edema. Lateral: Small area of delamination along the central lateral tibial plateau. No full-thickness defect. Joint: Large joint effusion with synovitis. Small layering component within the lateral suprapatellar joint space could reflect a small amount of hemorrhage. No plical thickening. Normal Hoffa's fat. Popliteal Fossa:  No Baker cyst. Intact popliteus tendon. Extensor Mechanism:  Intact quadriceps tendon and patellar tendon. Bones: No fracture or dislocation. Small tricompartmental osteophytes. Other: Moderate subcutaneous edema along the lateral knee. No focal fluid collection. Feathery edema within the distal vastus lateralis muscle. IMPRESSION: 1. Large joint effusion with synovitis. Small layering component within the lateral suprapatellar joint space could reflect a small amount of hemorrhage. Consider further evaluation with arthrocentesis. 2. Small radial tear of the medial meniscus body. 3. High-grade cartilage loss in the medial and patellofemoral compartments as described above. 4. Small amount of edema within the distal vastus lateralis muscle may reflect strain. Electronically Signed   By: Titus Dubin M.D.   On: 07/21/2017 21:07   Rory Percy, DO 07/22/2017, 12:00 PM PGY-1, [place] Intern pager: [phone], text pages welcome

## 2017-07-22 NOTE — Progress Notes (Signed)
Family Medicine paged to make aware that radiology dept. called and stated that family Medicine need to call the read room at [phone] and talk to the radiologist about patient LP.

## 2017-07-22 NOTE — Progress Notes (Signed)
Patient refuses CPAP and has no machine at bedside.

## 2017-07-22 NOTE — Progress Notes (Signed)
Subjective: No significant change from yesterday  Exam: Vitals:   07/22/17 1518 07/22/17 1627  BP: 112/75 117/81  Pulse: 88 89  Resp: 20   Temp: 99.5 F (37.5 C)   SpO2: 95%    Gen: In bed, NAD Resp: non-labored breathing, no acute distress Abd: soft, nt  Neuro: MS: Awake, alert CN: Face symmetric Motor: He is limited primarily by pain at least 4/5 on the left, guards on the right Sensory: He has decreased pain and temperature to at least the mid thigh  Pertinent Labs: B12 900s A1c 6.2 RPR negative  Impression: 52 year old male with severe lower extremity pain, edema, with loss of pain and temperature sensation. Possibilities include pain secondary to his edema versus small fiber neuropathy.  With the acuity of the onset of the symptoms, if this does represent a small fiber neuropathy, I think it could be a Guillain-Barr variant.  I think that a lumbar puncture could be useful.   Recommendations: 1) SPEP/IFX,  hepatitis c,  Ana, ssa, ssb 2) consider skin biopsy, though I am not sure of the utility this early in the presentation 3) LP for cells, glucose, protein  Ritta SlotMcNeill Chaney Ingram, MD [facility] [phone]  If 7pm- 7am, please page neurology on call as listed in AMION.

## 2017-07-22 NOTE — Discharge Summary (Signed)
Family Medicine [facility] Discharge Summary  Patient name: Trevor Nunez Medical record number: [id number] Date of birth: 1965-08-28 Age: 52 y.o. Gender: male Date of Admission: 07/20/2017  Date of Discharge: 07/27/2017 Admitting Physician: Latrelle DodrillBrittany J McIntyre, MD  Primary Care Provider: Barbette ReichmannHande, Vishwanath, MD Consultants: Neurology, Nephrology, Orthopedics  Indication for Hospitalization: bilateral LE weakness and pain  Discharge Diagnoses/Problem List:  Bilateral LE weakness, improving Acute on Chronic Kidney Disease, stable HFpEF, stable HTN, stable Gout, stable R arm injury prior to hospitalization, improved OSA, stable  Disposition: CIR  Discharge Condition: Improved  Discharge Exam:  General: lying in bed, in NAD Cardiovascular: RRR, no MRG  Respiratory: CTAB, no wheezes/rales/rhonchi Abdomen: obese abdomen, nontender to palpation, no rebound or guarding, +BS MSK:ROM increased today but still limited by pain in R leg. strength 5/5 to U/LE bilaterally with great improvement in L leg. R knee edema improved s/p arthrocentesis, some warmth appreciated. No LE edema noted. Neuro:Alert and oriented, speech normal. Optic field normal. Intact symmetric sensation to light touch of extremities bilaterally. Hearing grossly intact bilaterally  Brief Hospital Course:  Trevor Baneroy Brooksis a 52 y.o.malewith PMH significant for HTN,CKD,HFpEF, morbid obesity who presented with 2 week h/o progressive bilateral LE weakness and pain with fall noted prior to admission. Throughout hospitalization, patient's strength improved minimally with PT in consult, however pain was persistent. On exam, patient was noted to have extensive LE edema with adequate diuresis with one dose of IV Lasix and home Torsemide. MRI head without abnormalities. MRI cervical/thoracic spine showed foraminal narrowing with moderate to severe on the R C4-5 with mild paraspinal muscle atrophy throughout cervical and  thoracic region. MRI lumbar spine showed L4-5 and L5-S1 spondylosis with mild bilateral foraminal stenosis at L4-5 and L5-S1 but no significant canal stenosis. Neurology was consulted this admission who performed lumbar puncture which was unrevealing. Extensive immunologic workup was performed including (Anti-Jo antibody, Hep C antibody, ANA, Sjogrens, SPEP/IFX) which was all negative. Neurology recommended paraspinal EMG with possible muscle biopsy and skin biopsy outpatient. On exam after diuresis, R leg noted to be more swollen than L leg, particularly R knee. R knee MRI obtained which showed large joint effusion and small amount of muscular edema which could reflect strain. Ortho consulted who drained synovial fluid which was consistent with gout. Patient was started on a 5 day Prednisone burst which patient will continue on discharge.  Patient also had acute on chronic kidney injury this admission with U/A notable for increased protein and rare bacteria. FeUrea indicating prerenal etiology although diuresed well throughout this admission. Renal U/S obtained with no abnormalities noted. Nephrology consulted who recommended titrating down patient's home torsemide dose to 60mg  once daily, and thought current kidney function is likely new baseline (Cr 2.20). Repeat ECHO obtained this admission with preserved EF (65-70%).  Patient reports to have fallen just prior to admission with injury to R arm. Initial xrays were suspicious for occult fracture. Ortho evaluated and placed in sugar tong cast which was removed 4/8. Per ortho, can follow up with PCP outpatient.  Issues for Follow Up:  1. Medication Changes: 1. Titrated torsemide to 60mg  daily 2. Continue Prednisone 50mg  for 3 more days 3. Consider ARB for renal protection, can also aid in lowering uric acid levels given h/o gout. 2. Patient would likely benefit from outpatient paraspinal EMB and skin biopsy and possible muscle biopsy. 3. R arm should  continue to get better per ortho (may take up to 3 weeks), could be a tendon issue per  OrthoGaylord Shih states they did not appreciate any fracture on admission xray. Can f/u with PCP. If no better in 3 weeks, can then f/u with ortho as an outpatient. 4. Will follow up with Nephrology on 09/01/2017 at 2:30pm with labs 1 week prior to that.  Significant Procedures: Lumbar puncture, R knee arthrocentesis  Significant Labs and Imaging:  Recent Labs  Lab 07/24/17 0345 07/25/17 0356 07/27/17 0636  WBC 7.3 7.6 16.1*  HGB 12.8* 13.4 13.8  HCT 39.5 41.2 41.4  PLT 374 405* 525*   Recent Labs  Lab 07/21/17 2009 07/22/17 1426 07/24/17 0345 07/24/17 1230 07/25/17 0356 07/26/17 0750 07/27/17 0636  NA 137 135 135  --  135 135 133*  K 2.9* 3.8 3.3*  --  3.8 4.3 3.8  CL 93* 94* 93*  --  95* 97* 95*  CO2 29 25 28   --  27 27 24   GLUCOSE 217* 144* 128*  --  137* 153* 192*  BUN 45* 37* 38*  --  38* 45* 50*  CREATININE 2.23* 2.49* 2.29*  --  2.20* 2.21* 2.08*  CALCIUM 9.3 8.5* 8.9  --  8.8* 9.5 8.8*  MG  --   --   --  2.0  --   --   --   PHOS  --   --   --  3.8  --   --  3.4  ALKPHOS 94  --   --   --   --   --   --   AST 34  --   --   --   --   --   --   ALT 29  --   --   --   --   --   --   ALBUMIN 4.1  --   --   --   --   --  2.9*   Dg Elbow Complete Right (3+view)  Result Date: 07/21/2017 CLINICAL DATA:  Elbow pain after fall 4 days ago. EXAM: RIGHT ELBOW - COMPLETE 3+ VIEW COMPARISON:  Right elbow x-rays dated July 16, 2017. FINDINGS: Persistent elbow joint effusion. No discrete fracture identified. No dislocation. Joint spaces are preserved. Bone mineralization is normal. Triceps enthesopathy. Soft tissues are unremarkable. IMPRESSION: 1. Unchanged elbow joint effusion, suspicious for occult fracture given history of trauma. Electronically Signed   By: Obie Dredge M.D.   On: 07/21/2017 17:22   Mr Brain Wo Contrast  Result Date: 07/20/2017 CLINICAL DATA:  52 y/o  M; bilateral lower  extremity weakness. EXAM: MRI HEAD WITHOUT CONTRAST TECHNIQUE: Multiplanar, multiecho pulse sequences of the brain and surrounding structures were obtained without intravenous contrast. COMPARISON:  10/17/2015 CT of the head. FINDINGS: Brain: No acute infarction, hemorrhage, hydrocephalus, extra-axial collection or mass lesion. Fewnonspecific foci of T2 FLAIR hyperintense signal abnormality in subcortical and periventricular white matter are compatible withmildchronic microvascular ischemic changes for age. Mildbrain parenchymal volume loss. Vascular: Normal flow voids. Skull and upper cervical spine: Normal marrow signal. Sinuses/Orbits: Negative. Other: None. IMPRESSION: 1. No acute intracranial abnormality identified. 2. Mild chronic microvascular ischemic changes and mild parenchymal volume loss of the brain. Electronically Signed   By: Mitzi Hansen M.D.   On: 07/20/2017 22:07   Mr Cervical Spine Wo Contrast  Result Date: 07/21/2017 CLINICAL DATA:  Severe LEFT leg pain 2 weeks ago resulting in fall, and RIGHT arm injury. Progressive lower extremity weakness. Assess myelopathy. History of chronic kidney disease, hypertension, heart failure. EXAM: MRI CERVICAL, THORACIC AND LUMBAR SPINE WITHOUT  CONTRAST TECHNIQUE: Multiplanar and multiecho pulse sequences of the cervical spine, to include the craniocervical junction and cervicothoracic junction, and thoracic, were obtained without intravenous contrast. COMPARISON:  MRI of the head July 20, 2017 and MRI of the lumbar spine July 20, 2017 FINDINGS: MRI CERVICAL SPINE FINDINGS-mild motion degraded examination. ALIGNMENT: Maintained cervical lordosis.  No malalignment. VERTEBRAE/DISCS: Vertebral bodies are intact. Intervertebral disc morphology's and signal are normal. Multilevel mild chronic discogenic endplate changes lower cervical spine without acute or abnormal bone marrow signal. Subcentimeter probable hemangioma ventral C2. CORD:Cervical spinal  cord is normal morphology and signal characteristics from the cervicomedullary junction to level of T2-3, the most caudal well visualized level. POSTERIOR FOSSA, VERTEBRAL ARTERIES, PARASPINAL TISSUES: No MR findings of ligamentous injury. Vertebral artery flow voids present. Included posterior fossa and paraspinal soft tissues are nonacute. Mild symmetric paraspinal muscle atrophy, disproportionate to the degree of trapezius in remaining muscles. Posterior fossa arachnoid cyst. DISC LEVELS: C2-3: Annular bulging, uncovertebral hypertrophy. No canal stenosis or neural foraminal narrowing. C3-4: Uncovertebral hypertrophy without canal stenosis. Mild RIGHT neural foraminal narrowing. C4-5: RIGHT uncovertebral hypertrophy and mild RIGHT greater than LEFT facet arthropathy. No canal stenosis. Moderate to severe RIGHT neural foraminal narrowing. C5-6: Small broad-based disc bulge, uncovertebral hypertrophy. Minimal RIGHT, moderate LEFT facet arthropathy. No canal stenosis. Moderate RIGHT, mild LEFT neural foraminal narrowing. C6-7: Small central disc protrusion. No canal stenosis. Minimal RIGHT neural foraminal narrowing. C7-T1: No disc bulge, canal stenosis nor neural foraminal narrowing. MRI THORACIC SPINE FINDINGS-Mild motion degraded examination. ALIGNMENT: Maintenance of the thoracic kyphosis. No malalignment. VERTEBRAE/DISCS: Vertebral bodies are intact. Multilevel mild degenerative discs associated with desiccation mild chronic discogenic endplate changes. No acute or abnormal bone marrow signal. CORD: Thoracic spinal cord is normal morphology and signal characteristics to the level of the conus medullaris which terminates at T12-L1. PREVERTEBRAL AND PARASPINAL SOFT TISSUES: Moderate symmetric paraspinal muscle atrophy (multifidus, longissimus) solid normalizing toward the lumbar spine. Normal appearance the trapezius. 9 mm T2 bright cyst at T9-10 paraspinal soft tissues, nonspecific. DISC LEVELS: No significant  disc bulge, canal stenosis or neural foraminal narrowing IMPRESSION: MRI cervical spine: 1. No fracture, malalignment or abnormal spinal cord signal on this motion degraded examination. 2. No canal stenosis. Neural foraminal narrowing C3-4 through C6-7: Moderate to severe on the RIGHT at C4-5. 3. Mild paraspinal muscle atrophy, disproportionate to remainder of included musculature. Though nonspecific, spinal muscular atrophy/muscular dystrophy could have this appearance. MRI thoracic spine: 1. No fracture, malalignment or abnormal spinal cord signal on this motion degraded examination. 2. No canal stenosis or neural foraminal narrowing. 3. Mild paraspinal muscle atrophy, disproportionate to remainder of included musculature. Though nonspecific, spinal muscular atrophy/muscular dystrophy could have this appearance. If symptoms persist, histopathologic sampling may be indicated. Electronically Signed   By: Awilda Metro M.D.   On: 07/21/2017 20:26   Mr Thoracic Spine Wo Contrast  Result Date: 07/21/2017 CLINICAL DATA:  Severe LEFT leg pain 2 weeks ago resulting in fall, and RIGHT arm injury. Progressive lower extremity weakness. Assess myelopathy. History of chronic kidney disease, hypertension, heart failure. EXAM: MRI CERVICAL, THORACIC AND LUMBAR SPINE WITHOUT CONTRAST TECHNIQUE: Multiplanar and multiecho pulse sequences of the cervical spine, to include the craniocervical junction and cervicothoracic junction, and thoracic, were obtained without intravenous contrast. COMPARISON:  MRI of the head July 20, 2017 and MRI of the lumbar spine July 20, 2017 FINDINGS: MRI CERVICAL SPINE FINDINGS-mild motion degraded examination. ALIGNMENT: Maintained cervical lordosis.  No malalignment. VERTEBRAE/DISCS: Vertebral bodies are intact. Intervertebral disc  morphology's and signal are normal. Multilevel mild chronic discogenic endplate changes lower cervical spine without acute or abnormal bone marrow signal.  Subcentimeter probable hemangioma ventral C2. CORD:Cervical spinal cord is normal morphology and signal characteristics from the cervicomedullary junction to level of T2-3, the most caudal well visualized level. POSTERIOR FOSSA, VERTEBRAL ARTERIES, PARASPINAL TISSUES: No MR findings of ligamentous injury. Vertebral artery flow voids present. Included posterior fossa and paraspinal soft tissues are nonacute. Mild symmetric paraspinal muscle atrophy, disproportionate to the degree of trapezius in remaining muscles. Posterior fossa arachnoid cyst. DISC LEVELS: C2-3: Annular bulging, uncovertebral hypertrophy. No canal stenosis or neural foraminal narrowing. C3-4: Uncovertebral hypertrophy without canal stenosis. Mild RIGHT neural foraminal narrowing. C4-5: RIGHT uncovertebral hypertrophy and mild RIGHT greater than LEFT facet arthropathy. No canal stenosis. Moderate to severe RIGHT neural foraminal narrowing. C5-6: Small broad-based disc bulge, uncovertebral hypertrophy. Minimal RIGHT, moderate LEFT facet arthropathy. No canal stenosis. Moderate RIGHT, mild LEFT neural foraminal narrowing. C6-7: Small central disc protrusion. No canal stenosis. Minimal RIGHT neural foraminal narrowing. C7-T1: No disc bulge, canal stenosis nor neural foraminal narrowing. MRI THORACIC SPINE FINDINGS-Mild motion degraded examination. ALIGNMENT: Maintenance of the thoracic kyphosis. No malalignment. VERTEBRAE/DISCS: Vertebral bodies are intact. Multilevel mild degenerative discs associated with desiccation mild chronic discogenic endplate changes. No acute or abnormal bone marrow signal. CORD: Thoracic spinal cord is normal morphology and signal characteristics to the level of the conus medullaris which terminates at T12-L1. PREVERTEBRAL AND PARASPINAL SOFT TISSUES: Moderate symmetric paraspinal muscle atrophy (multifidus, longissimus) solid normalizing toward the lumbar spine. Normal appearance the trapezius. 9 mm T2 bright cyst at T9-10  paraspinal soft tissues, nonspecific. DISC LEVELS: No significant disc bulge, canal stenosis or neural foraminal narrowing IMPRESSION: MRI cervical spine: 1. No fracture, malalignment or abnormal spinal cord signal on this motion degraded examination. 2. No canal stenosis. Neural foraminal narrowing C3-4 through C6-7: Moderate to severe on the RIGHT at C4-5. 3. Mild paraspinal muscle atrophy, disproportionate to remainder of included musculature. Though nonspecific, spinal muscular atrophy/muscular dystrophy could have this appearance. MRI thoracic spine: 1. No fracture, malalignment or abnormal spinal cord signal on this motion degraded examination. 2. No canal stenosis or neural foraminal narrowing. 3. Mild paraspinal muscle atrophy, disproportionate to remainder of included musculature. Though nonspecific, spinal muscular atrophy/muscular dystrophy could have this appearance. If symptoms persist, histopathologic sampling may be indicated. Electronically Signed   By: Awilda Metro M.D.   On: 07/21/2017 20:26   Mr Lumbar Spine Wo Contrast  Result Date: 07/20/2017 CLINICAL DATA:  52 y/o M; severe leg pain with activity and numbness in the left leg. EXAM: MRI LUMBAR SPINE WITHOUT CONTRAST TECHNIQUE: Multiplanar, multisequence MR imaging of the lumbar spine was performed. No intravenous contrast was administered. COMPARISON:  None. FINDINGS: Segmentation:  Standard. Alignment:  Physiologic. Vertebrae: Diffusely heterogeneous and decreased signal within bone marrow on T1 and T2 weighted sequences. No bone marrow or disc edema. Conus medullaris and cauda equina: Conus extends to the L1 level. Conus and cauda equina appear normal. Paraspinal and other soft tissues: Negative. Disc levels: L1-2: No significant disc displacement, foraminal stenosis, or canal stenosis. L2-3: No significant disc displacement, foraminal stenosis, or canal stenosis. L3-4: No significant disc displacement, foraminal stenosis, or canal  stenosis. L4-5: Small disc bulge and mild facet hypertrophy. Mild bilateral foraminal stenosis. No canal stenosis. L5-S1: Small disc bulge with left foraminal marginal osteophytes and left-greater-than-right facet hypertrophy. Mild right and moderate left foraminal stenosis. No canal stenosis. IMPRESSION: 1. Lumbar spondylosis predominantly at L4-5  and L5-S1 levels. 2. Mild bilateral L4-5 foraminal stenosis as well as mild right and moderate left L5-S1 foraminal stenosis. 3. No significant canal stenosis. 4. Diffuse heterogeneous and decreased bone marrow signal probably represents marrow reconversion in the setting of heart failure and chronic kidney disease. Iron deposition or myelofibrosis is also possible. Electronically Signed   By: Mitzi Hansen M.D.   On: 07/20/2017 17:44   Mr Knee Right Wo Contrast  Result Date: 07/21/2017 CLINICAL DATA:  Acute anterior knee pain for the past 2 weeks. No known injury. EXAM: MRI OF THE RIGHT KNEE WITHOUT CONTRAST TECHNIQUE: Multiplanar, multisequence MR imaging of the knee was performed. No intravenous contrast was administered. COMPARISON:  None. FINDINGS: Despite efforts by the technologist and patient, motion artifact is present on today's exam and could not be eliminated. This reduces exam sensitivity and specificity. MENISCI Medial meniscus:  Blunting of the body free edge. Lateral meniscus:  Intact. LIGAMENTS Cruciates:  Intact ACL and PCL. Collaterals: Medial collateral ligament is intact. Lateral collateral ligament complex is intact. CARTILAGE Patellofemoral: High-grade near full-thickness cartilage loss over the patellar apex extending into the medial facet, with underlying subchondral cystic change. Medial: High-grade near full-thickness cartilage loss over the central weight-bearing medial femoral condyle and medial tibial plateau with underlying subchondral marrow edema. Lateral: Small area of delamination along the central lateral tibial plateau.  No full-thickness defect. Joint: Large joint effusion with synovitis. Small layering component within the lateral suprapatellar joint space could reflect a small amount of hemorrhage. No plical thickening. Normal Hoffa's fat. Popliteal Fossa:  No Baker cyst. Intact popliteus tendon. Extensor Mechanism:  Intact quadriceps tendon and patellar tendon. Bones: No fracture or dislocation. Small tricompartmental osteophytes. Other: Moderate subcutaneous edema along the lateral knee. No focal fluid collection. Feathery edema within the distal vastus lateralis muscle. IMPRESSION: 1. Large joint effusion with synovitis. Small layering component within the lateral suprapatellar joint space could reflect a small amount of hemorrhage. Consider further evaluation with arthrocentesis. 2. Small radial tear of the medial meniscus body. 3. High-grade cartilage loss in the medial and patellofemoral compartments as described above. 4. Small amount of edema within the distal vastus lateralis muscle may reflect strain. Electronically Signed   By: Obie Dredge M.D.   On: 07/21/2017 21:07   Dg Chest Port 1 View  Result Date: 07/20/2017 CLINICAL DATA:  Shortness of breath, pain in both legs EXAM: PORTABLE CHEST 1 VIEW COMPARISON:  11/16/2016 FINDINGS: The heart size and mediastinal contours are within normal limits. Both lungs are clear. The visualized skeletal structures are unremarkable. IMPRESSION: No active disease. Electronically Signed   By: Elige Ko   On: 07/20/2017 16:04   Results/Tests Pending at Time of Discharge: None  Discharge Medications:  Allergies as of 07/27/2017      Reactions   Codeine Palpitations   Strawberry (diagnostic) Rash, Other (See Comments)   "Tongue swells/feels heavy"      Medication List    STOP taking these medications   allopurinol 100 MG tablet Commonly known as:  ZYLOPRIM   hydrALAZINE 50 MG tablet Commonly known as:  APRESOLINE   lidocaine 5 % Commonly known as:  LIDODERM    metolazone 2.5 MG tablet Commonly known as:  ZAROXOLYN   oxyCODONE-acetaminophen 5-325 MG tablet Commonly known as:  PERCOCET   potassium chloride SA 20 MEQ tablet Commonly known as:  K-DUR,KLOR-CON     TAKE these medications   amitriptyline 25 MG tablet Commonly known as:  ELAVIL Take 1 tablet (25  mg total) by mouth at bedtime as needed for sleep (pain).   aspirin 81 MG chewable tablet Chew 1 tablet (81 mg total) by mouth daily.   carvedilol 12.5 MG tablet Commonly known as:  COREG Take 12.5 mg by mouth 2 (two) times daily with a meal.   cyclobenzaprine 5 MG tablet Commonly known as:  FLEXERIL Take 1-2 tablets 3 times daily as needed   nitroGLYCERIN 0.4 MG SL tablet Commonly known as:  NITROSTAT Place 1 tablet (0.4 mg total) under the tongue every 5 (five) minutes as needed for chest pain.   predniSONE 50 MG tablet Commonly known as:  DELTASONE Take 1 tablet (50 mg total) by mouth daily with breakfast for 3 days.   spironolactone 25 MG tablet Commonly known as:  ALDACTONE Take 25 mg by mouth daily.   torsemide 20 MG tablet Commonly known as:  DEMADEX Take 3 tablets (60 mg total) by mouth daily. Start taking on:  07/28/2017 What changed:  when to take this       Discharge Instructions: Please refer to Patient Instructions section of EMR for full details.  Patient was counseled important signs and symptoms that should prompt return to medical care, changes in medications, dietary instructions, activity restrictions, and follow up appointments.   Follow-Up Appointments: Follow-up Information    Zetta Bills, MD Follow up on 09/01/2017.   Specialty:  Nephrology Why:  at 2:30 PM. Office will send a reminder as well as set you up for labs prior to appt. Contact information: [address] [place] [address] [phone]           Ellwood Dense, DO 07/27/2017, 1:52 PM PGY-1, [facility]

## 2017-07-22 NOTE — Progress Notes (Signed)
Patient declined CPAP at this time no distress noted, RCP will continue to monitor.

## 2017-07-23 ENCOUNTER — Inpatient Hospital Stay (HOSPITAL_COMMUNITY): Payer: Medicaid Other

## 2017-07-23 DIAGNOSIS — R202 Paresthesia of skin: Secondary | ICD-10-CM

## 2017-07-23 DIAGNOSIS — R52 Pain, unspecified: Secondary | ICD-10-CM

## 2017-07-23 DIAGNOSIS — R200 Anesthesia of skin: Secondary | ICD-10-CM

## 2017-07-23 LAB — CSF CELL COUNT WITH DIFFERENTIAL
RBC COUNT CSF: 0 /mm3
TUBE #: 1
WBC, CSF: 2 /mm3 (ref 0–5)

## 2017-07-23 LAB — GLUCOSE, CSF: Glucose, CSF: 74 mg/dL — ABNORMAL HIGH (ref 40–70)

## 2017-07-23 LAB — PROTEIN, CSF: Total  Protein, CSF: 43 mg/dL (ref 15–45)

## 2017-07-23 LAB — COPPER, SERUM: Copper: 177 ug/dL — ABNORMAL HIGH (ref 72–166)

## 2017-07-23 LAB — ANTI-JO 1 ANTIBODY, IGG: Anti JO-1: <0.2 AI (ref 0.0–0.9)

## 2017-07-23 LAB — UREA NITROGEN, URINE: Urea Nitrogen, Ur: 357 mg/dL

## 2017-07-23 MED ORDER — ENOXAPARIN SODIUM 40 MG/0.4ML ~~LOC~~ SOLN
40.0000 mg | SUBCUTANEOUS | Status: DC
  Administered 2017-07-23: 40 mg via SUBCUTANEOUS
  Filled 2017-07-23: qty 0.4

## 2017-07-23 MED ORDER — OXYCODONE HCL 5 MG PO TABS
5.0000 mg | ORAL_TABLET | Freq: Once | ORAL | Status: AC
  Administered 2017-07-23: 5 mg via ORAL
  Filled 2017-07-23: qty 1

## 2017-07-23 NOTE — Progress Notes (Signed)
Family Medicine Teaching Service Daily Progress Note Intern Pager: [phone]  Patient name: Trevor Nunez Medical record number: [id number] Date of birth: 1965-11-18 Age: 52 y.o. Gender: male  Primary Care Provider: Tracie Harrier, MD Consultants: None Code Status: Full  Pt Overview and Major Events to Date:  4/3 - admitted for bilateral LE weakness/pain   Assessment and Plan: Trevor Nunez is a 52 y.o. male presenting with BL LE weakness/pain. PMH is significant for HTN, CKD, HFpEF, morbid obesity.  Bilateral LE weakness and pain: Pain and weakness persists. Pain with light palpation. ABIs obtain which were limited due to pain and bandaging of RUE, L ABI was wnl. LE dopplers negative for DVT. ESR and CRP elevated at 65 and 18.6, respectively. MRI C/T showed neural foraminal narrowing C3-4 through C6-7 with moderate to severe on the RIGHT at C4-5.  Also with "mild paraspinal muscle atrophy, disproportionate to remainder of included musculature. Though nonspecific, spinal muscular atrophy/muscular dystrophy could have this appearance. If symptoms persist, histopathologic sampling may be indicated."  Knee MRI showed large joint effusion with synovitis, possible small amount of hemorrhage, can consider consider arthrocentesis. Small amount of edema within the distal vastus lateralis muscle was visualized that could reflect strain.  -Neurology recommended LP with further lab work up: SPEP/IFX, hep C, ANA, SSA, SSB, consider skin biopsy, copper level, Anti-Jo antibody. Labs ordered and currently pending.  Bedside LP would to be difficult to perform 2/2 body habitus and will require fluoro guidance.   D/w Dr. Leonel Ramsay and fluoro LP order placed this AM.  Spoke with Radiology Dr. Golden Circle and plan for LP today.  Holding lovenox and pt is NPO for procedure.   - DG fluoro guided LP 4/6, will follow up CSF cell count w diff, cx, protein, glucose  - PT/OT - CIR - gabapentin 200 mg TID, consider increase dose  if needed  - f/u above labs  HFpEF: repeat ECHO EF 65-70%, with moderate diastolic dysfunction. BNP 67.3 at admission. Diuresed well on home torsemide with outpt 1.2L. Continues on home torsemide 60 mg TID and spironolactone daily. No pulmonary edema appreciated on exam. No weights recorded yet for today.    - continue home torsemide 60 mg TID and spironolactone 25 mg daily  - strict I's and O's  - daily weights   AoCKD:  With worsening renal function SCr 2.49 this AM from 2.23 yesterday.  Baseline 1.8-2.0. UA notable for protein 100, hyaline, granular and amorphous crystal casts. Possible he could have slight muscle breakdown from not moving much the past few weeks. No Hb on U/A and only mildly elevated CK so rhabdomyolysis less likely. Renal US 4/5 with no acute findings or hydronephrosis.   - monitor on daily BMP - Fe Urea pending  - repeat CK 341, improved from initial 436   HTN: Normotensive overnight with BP this AM 138/84.   Home regimen includes Coreg 12.5 mg BID, hydralazine 50 mg TID, Torsemide 60 mg TID.  - continue home meds   H/o gout Previously on allopurinol. Uric acid elevated at 13.4. R knee warm and swollen, could be gout related. - continue allopurinol   R arm injury s/p fall: Currently in sugar tong cast.  Patient says he was told that it is possibly that he had a fracture though none noted on initial ED xrays, would need follow up with orthopedics. Appointment supposed to be on 4/4. - f/u with orthopedics outpatient   OSA: Recently diagnosis of OSA in February, still waiting on a  CPAP machine at home  - CPAP at night, refusing   FEN/GI: NPO for procedure this AM then may resume heart healthy diet Prophylaxis: holding lovenox for LP   Disposition: continue inpatient work up of bilateral LE weakness and pain  Subjective:  Patient in Radiology for procedure, will recheck later this morning.   12 PM - Pt returned from lumbar puncture and tolerated procedure  well.  Endorsing pain at LP site, no headache noted. Laying flat on bed.  Discussed plan with patient at bedside. Diet added.    Objective: Temp:  [98.3 F (36.8 C)-100.2 F (37.9 C)] 98.3 F (36.8 C) (04/06 0445) Pulse Rate:  [72-91] 72 (04/06 0813) Resp:  [18-20] 18 (04/06 0445) BP: (112-138)/(75-84) 123/83 (04/06 1153) SpO2:  [91 %-95 %] 93 % (04/06 0445)   Physical Exam: General: pleasant 52 yo male, in NAD, lying flat in bed s/p LP    Cardiovascular: RRR, no MRG  Respiratory: CTAB, no wheezes/rales/rhonchi Abdomen: obese abdomen, nontender to palpation, +BS MSK: 1+ pitting edema of LE (R>L)  Neuro: Alert, Ox3, speech is normal.  EOMI.  Sensation intact.  Motor strength 5/5 LLE, 3/5 RLE and 5/5 UE bilaterally.    Laboratory: Recent Labs  Lab 07/20/17 1800 07/21/17 0420  WBC 10.2 9.5  HGB 13.8 14.0  HCT 41.4 42.9  PLT 308 310   Recent Labs  Lab 07/21/17 0420 07/21/17 2009 07/22/17 1426  NA 135 137 135  K 3.5 2.9* 3.8  CL 96* 93* 94*  CO2 _0 BUN 25* 45* 37*  CREATININE 2.18* 2.23* 2.49*  CALCIUM 8.6* 9.3 8.5*  PROT  --  7.2  --   BILITOT  --  0.9  --   ALKPHOS  --  94  --   ALT  --  29  --   AST  --  34  --   GLUCOSE 142* 217* 144*   Imaging/Diagnostic Tests: US Renal  Result Date: 07/22/2017 CLINICAL DATA:  Acute kidney injury. EXAM: RENAL / URINARY TRACT ULTRASOUND COMPLETE COMPARISON:  None. FINDINGS: Right Kidney: Length: 10.7 cm. Mild renal cortical thinning. Normal parenchymal echogenicity. No mass, stone or hydronephrosis. Left Kidney: Length: 11.6 cm. Mild renal cortical thinning. Normal parenchymal echogenicity. No mass, stone or hydronephrosis. Bladder: Appears normal for degree of bladder distention. IMPRESSION: 1. No acute findings.  No hydronephrosis. 2. Mild renal cortical thinning bilaterally. No other abnormalities. Electronically Signed   By: Lajean Manes M.D.   On: 07/22/2017 21:32   Dg Fluoro Guide Lumbar Puncture  Result Date:  07/23/2017 CLINICAL DATA:  Generalized weakness EXAM: DIAGNOSTIC LUMBAR PUNCTURE UNDER FLUOROSCOPIC GUIDANCE FLUOROSCOPY TIME:  Fluoroscopy Time:  24 seconds Radiation Exposure Index (if provided by the fluoroscopic device): 30.3 mGy Number of Acquired Spot Images: 1 PROCEDURE: Informed consent was obtained from the patient prior to the procedure, including potential complications of headache, allergy, and pain. With the patient prone, the lower back was prepped with Betadine. 1% Lidocaine was used for local anesthesia. Lumbar puncture was performed at the L3-4 level using a 20 gauge needle with return of clear CSF with an opening pressure of 5.5 cm water. 13 ml of CSF were obtained for laboratory studies. The patient tolerated the procedure well and there were no apparent complications. IMPRESSION: Successful fluoroscopic guided lumbar puncture. Electronically Signed   By: Inez Catalina M.D.   On: 07/23/2017 11:52   Lovenia Kim, MD 07/23/2017, 12:27 PM PGY-2, [facility] Intern pager: [phone],  text pages welcome

## 2017-07-23 NOTE — Progress Notes (Signed)
RN notified by CCMD that pt had an 8 beat and 6 beat run of SVT. Pt asymptomatic. MD notified. Will continue to assess.

## 2017-07-23 NOTE — Progress Notes (Signed)
PT Cancellation Note  Patient Details Name: Trevor Nunez MRN: [id number] DOB: November 23, 1965   Cancelled Treatment:    Reason Eval/Treat Not Completed: Other (comment).  Declined therapy as he has been at testing and wants to eat. Asked PT to come back tomorrow if possible.   Ivar DrapeRuth E Daxton Nydam 07/23/2017, 3:09 PM   Samul Dadauth Silvia Markuson, PT MS Acute Rehab Dept. Number: [facility] [id number] and [facility] [phone]

## 2017-07-23 NOTE — Procedures (Signed)
Lumbar puncture under fluoro without difficulty  Complications:  None  Blood Loss: none  See dictation in canopy pacs

## 2017-07-23 NOTE — Progress Notes (Addendum)
Subjective: No significant change from yesterday  Exam: Vitals:   07/23/17 1153 07/23/17 1612  BP: 123/83 114/89  Pulse:  88  Resp:    Temp:    SpO2:     Gen: In bed, NAD Resp: non-labored breathing, no acute distress Abd: soft, nt  Neuro: MS: Awake, alert CN: Face symmetric Motor: He is limited primarily by pain at least 4/5 on the left, guards on the right.  He does a little bit better on the right today and is able to lift it out of bed. Sensory: He has absent pain and temperature to at least the mid thigh.  He has relatively preserved vibration and proprioception.  Pertinent Labs: CSF WBC-2 CSF RBC-0 CSF protein 43 (normal) CSF glucose 74  Impression: 52 year old male with severe lower extremity pain, edema, with loss of pain and temperature sensation. Possibilities include pain secondary to his edema versus small fiber neuropathy.  With normal protein, I do not know that I have enough evidence to support treating with IVIG as even with an albuminocytologic dissociation, this would be an unusual situation.  I do wonder if there is a unifying diagnosis tying together his edema, joint effusion, renal failure, elevated ESR, lower extremity pain.  Recommendations: 1) SPEP/IFX,  hepatitis c,  Ana, ssa, ssb 2) consider skin biopsy, though I am not sure of the utility this early in the presentation 3) could try amitriptyline or nortriptyline 25 mill grams nightly to help with pain. 4) since he reports no improvement with gabapentin, could consider discontinuing this.  I do not feel that increasing it would have much benefit since he is already on a fairly high dose for his renal function.  Roland Rack, MD [facility] [phone]  If 7pm- 7am, please page neurology on call as listed in [place].

## 2017-07-24 LAB — BASIC METABOLIC PANEL
Anion gap: 14 (ref 5–15)
BUN: 38 mg/dL — AB (ref 6–20)
CHLORIDE: 93 mmol/L — AB (ref 101–111)
CO2: 28 mmol/L (ref 22–32)
Calcium: 8.9 mg/dL (ref 8.9–10.3)
Creatinine, Ser: 2.29 mg/dL — ABNORMAL HIGH (ref 0.61–1.24)
GFR calc Af Amer: 36 mL/min — ABNORMAL LOW (ref >60)
GFR calc non Af Amer: 31 mL/min — ABNORMAL LOW (ref >60)
GLUCOSE: 128 mg/dL — AB (ref 65–99)
POTASSIUM: 3.3 mmol/L — AB (ref 3.5–5.1)
Sodium: 135 mmol/L (ref 135–145)

## 2017-07-24 LAB — MAGNESIUM: Magnesium: 2 mg/dL (ref 1.7–2.4)

## 2017-07-24 LAB — CBC
HEMATOCRIT: 39.5 % (ref 39.0–52.0)
Hemoglobin: 12.8 g/dL — ABNORMAL LOW (ref 13.0–17.0)
MCH: 28.9 pg (ref 26.0–34.0)
MCHC: 32.4 g/dL (ref 30.0–36.0)
MCV: 89.2 fL (ref 78.0–100.0)
Platelets: 374 10*3/uL (ref 150–400)
RBC: 4.43 MIL/uL (ref 4.22–5.81)
RDW: 12.8 % (ref 11.5–15.5)
WBC: 7.3 10*3/uL (ref 4.0–10.5)

## 2017-07-24 LAB — PHOSPHORUS: Phosphorus: 3.8 mg/dL (ref 2.5–4.6)

## 2017-07-24 MED ORDER — GI COCKTAIL ~~LOC~~
30.0000 mL | Freq: Two times a day (BID) | ORAL | Status: DC | PRN
  Administered 2017-07-24: 30 mL via ORAL
  Filled 2017-07-24 (×2): qty 30

## 2017-07-24 MED ORDER — POTASSIUM CHLORIDE CRYS ER 20 MEQ PO TBCR
40.0000 meq | EXTENDED_RELEASE_TABLET | Freq: Two times a day (BID) | ORAL | Status: AC
  Administered 2017-07-24 (×2): 40 meq via ORAL
  Filled 2017-07-24 (×2): qty 2

## 2017-07-24 MED ORDER — HEPARIN SODIUM (PORCINE) 5000 UNIT/ML IJ SOLN
5000.0000 [IU] | Freq: Three times a day (TID) | INTRAMUSCULAR | Status: DC
  Administered 2017-07-24 – 2017-07-27 (×9): 5000 [IU] via SUBCUTANEOUS
  Filled 2017-07-24 (×9): qty 1

## 2017-07-24 MED ORDER — AMITRIPTYLINE HCL 25 MG PO TABS
25.0000 mg | ORAL_TABLET | Freq: Every day | ORAL | Status: DC
Start: 2017-07-24 — End: 2017-07-27
  Administered 2017-07-24 – 2017-07-26 (×3): 25 mg via ORAL
  Filled 2017-07-24 (×3): qty 1

## 2017-07-24 NOTE — Progress Notes (Signed)
Patient is refusing the use of CPAP for tonight. RT informed patient if he changes his mind have RN contact RT. 

## 2017-07-24 NOTE — Progress Notes (Signed)
Pt had a 5 beat run of vtach. Pt asymptomatic. MD made aware. Will continue to assess.

## 2017-07-24 NOTE — Progress Notes (Signed)
Family Medicine Teaching Service Daily Progress Note Intern Pager: [phone]  Patient name: Trevor Nunez Medical record number: [id number] Date of birth: Jul 24, 1965 Age: 52 y.o. Gender: male  Primary Care Provider: Barbette Reichmann, MD Consultants: None Code Status: FULL   Pt Overview and Major Events to Date:  4/3 - admitted for bilateral LE weakness/pain   Assessment and Plan: Trevor Nunez is a 52 y.o. male presenting with BL LE weakness/pain. PMH is significant for HTN, CKD, HFpEF, morbid obesity.  Bilateral LE weakness and pain: Pain and weakness persists.  -Neuro following, appreciate recs  -s/p LP with fluoro guidance on 4/6, pt tolerated procedure well and lovenox/diet resumed  -CSF labs: glucose 74, too few cells to count, protein 43, wbc 2  -Per neuro, not enough evidence to support tx with IVIG, will f/u pending labs for workup and consider skin biopsy.  - d/c gabapentin and start amitriptyline 25 mg nightly for pain control  -serum copper level elevated to 177, B12 881, folate 15.8 -SPEP/IFX, hep C, ANA, SSA, SSB, Anti-Jo antibody.  Labs ordered and currently pending.  -PT/OT- CIR  -c/s ortho to drain R knee effusion   SVT on tele 8 beat followed by 6 beat run of SVT identified on tele at ~5PM 4/6.  Patient asymptomatic and no c/o chest pain.  -continue to monitor on tele  -recheck Mag   HFpEF:  Repeat ECHO EF 65-70%, with moderate diastolic dysfunction. BNP 67.3 on admission. Diuresed well on home torsemide with outpt 1.2L.  Continues on home torsemide 60 mg TID and spironolactone daily. No pulmonary edema appreciated on exam. - continue home torsemide 60 mg TID and spironolactone 25 mg daily.  - strict I's and O's  - daily weights   AoCKD:  Mild improvement this morning with SCr 2.29 from 2.49 yesterday.  Baseline 1.8-2.0. UA notable for protein 100, hyaline, granular and amorphous crystal casts. Possible he could have slight muscle breakdown from not moving much the  past few weeks. No Hb on U/A and only mildly elevated CK so rhabdomyolysis less likely. Renal US 4/5 with no acute findings or hydronephrosis. Urea nitrogen 357.  - monitor on daily BMP  - Fe Urea pending  - repeat CK 341, improved from initial 436   HTN: Normotensive overnight with BP this AM 137/72.   Home regimen includes Coreg 12.5 mg BID, hydralazine 50 mg TID, Torsemide 60 mg TID.  - continue home meds   H/o gout Previously on allopurinol. Uric acid elevated at 13.4. R knee warm and swollen, could be gout related. - continue allopurinol   R arm injury s/p fall: Currently in sugar tong cast.  Patient says he was told that it is possibly that he had a fracture though none noted on initial ED xrays, would need follow up with orthopedics. Appointment supposed to be on 4/4. - f/u with orthopedics outpatient   OSA: Recently diagnosis of OSA in February, still waiting on a CPAP machine at home  - CPAP at night, refusing   FEN/GI: heart healthy diet Prophylaxis: lovenox  Disposition: CIR  Subjective:  Patient feels well this morning however reporting the same pain and swelling in bilateral legs.  Headache improved since yesterday.  He expresses frustration that he is unable to walk around.  Denies chest pain, shortness of breath.  Objective: Temp:  [98.1 F (36.7 C)-98.3 F (36.8 C)] 98.1 F (36.7 C) (04/07 0549) Pulse Rate:  [73-88] 77 (04/07 0918) Resp:  [18] 18 (04/07 0549) BP: (114-134)/(72-89)  134/72 (04/07 0918) SpO2:  [93 %-97 %] 93 % (04/07 0549) Weight:  [298 lb 8.1 oz (135.4 kg)-302 lb 0.5 oz (137 kg)] 302 lb 0.5 oz (137 kg) (04/07 0549)   Physical Exam: General: pleasant 52 yo male, in NAD Cardiovascular: RRR, no MRG  Respiratory: CTAB, no wheezes/rales/rhonchi Abdomen: nontender to palpation, no rebound or guarding, +BS MSK: 1+ pitting edema of LE (R>L), tender to palpation bilaterally Neuro: Alert, Ox3, speech is normal.  EOMI.  Sensation intact.  Motor  strength 5/5 LLE, 3/5 RLE and 5/5 UE bilaterally.    Laboratory: Recent Labs  Lab 07/20/17 1800 07/21/17 0420 07/24/17 0345  WBC 10.2 9.5 7.3  HGB 13.8 14.0 12.8*  HCT 41.4 42.9 39.5  PLT 308 310 374   Recent Labs  Lab 07/21/17 2009 07/22/17 1426 07/24/17 0345  NA 137 135 135  K 2.9* 3.8 3.3*  CL 93* 94* 93*  CO2 29 25 28   BUN 45* 37* 38*  CREATININE 2.23* 2.49* 2.29*  CALCIUM 9.3 8.5* 8.9  PROT 7.2  --   --   BILITOT 0.9  --   --   ALKPHOS 94  --   --   ALT 29  --   --   AST 34  --   --   GLUCOSE 217* 144* 128*   Imaging/Diagnostic Tests: No results found. Freddrick MarchAmin, Elynore Dolinski, MD 07/24/2017, 12:11 PM PGY-2, [facility] FPTS Intern pager: [phone], text pages welcome

## 2017-07-24 NOTE — Progress Notes (Signed)
Noted Attending service would like final dispo to be CIR. Please place order for consult if you would like pt considered for admission. Please advise. [phone]

## 2017-07-24 NOTE — Progress Notes (Signed)
Patient complaining of sudden onset of left lower quadrant abdominal pain.  Described as cramping, 10/10, radiating down both legs.  Left lower quadrant bowel sounds audible on auscultation.  Tender to the touch.  Paged FMTS provider Dr. Mosetta PuttFeng, who came to the floor and assessed the patient.  Once Dr. Mosetta PuttFeng arrived, the patient's pain had begun to subside.  No new orders were issued.  Will continue to monitor.

## 2017-07-24 NOTE — Progress Notes (Signed)
Patient refused CPAP at this time. No equipment at bedside.

## 2017-07-24 NOTE — Clinical Social Work Note (Signed)
Clinical Social Work Assessment  Patient Details  Name: Trevor Nunez MRN: [id number] Date of Birth: Nov 08, 1965  Date of referral:  07/24/17               Reason for consult:  Facility Placement                Permission sought to share information with:  Facility Medical sales representativeContact Representative, Family Supports Permission granted to share information::  Yes, Verbal Permission Granted  Name::     Daisy LazarClarence Manuel  Agency::  yes  Relationship::  Brother and Friend  Contact Information:  yes  Housing/Transportation Living arrangements for the past 2 months:  Single Family Home Source of Information:  Patient Patient Interpreter Needed:  None Criminal Activity/Legal Involvement Pertinent to Current Situation/Hospitalization:  No - Comment as needed Significant Relationships:  Siblings, Friend Lives with:  Friends Do you feel safe going back to the place where you live?    Need for family participation in patient care:  No (Coment)  Care giving concerns:  CSW received consult  Regarding SNF placement.  CSW spoke wit patient.  Patient had been living at home prior to being admitted to hospital.   Social Worker assessment / plan:  CSW spoke with patient regarding SNF placement.  Patient is agreeable for placement.  Employment status:  Disabled (Comment on whether or not currently receiving Disability) Insurance information:  [place] Recommendations:  Not assessed at this time(PT will revisited pt) Information / Referral to community resources:  (NA)  Patient/Family's Response to care:  Patient reports agreement with discharge plan.  Patient/Family's Understanding of and Emotional Response to Diagnosis, Current Treatment, and Prognosis:  Patient is realistic regarding therapy needs and expressed being hopeful for SNF placement.  Patient expressed understanding of CSW role and discharge process as well as medical condition.  No questions/concerns about plan or treatment.  Emotional  Assessment Appearance:  Appears stated age Attitude/Demeanor/Rapport:  Self-Confident Affect (typically observed):  Appropriate, Blunt Orientation:  Oriented to Self, Oriented to Place, Oriented to  Time, Oriented to Situation Alcohol / Substance use:  Never Used Psych involvement (Current and /or in the community):  No (Comment)  Discharge Needs  Concerns to be addressed:  No discharge needs identified Readmission within the last 30 days:  No Current discharge risk:  None Barriers to Discharge:  Continued Medical Work up   [facility] W Taquan Bralley, LCSWA 07/24/2017, 3:08 PM

## 2017-07-25 DIAGNOSIS — I5030 Unspecified diastolic (congestive) heart failure: Secondary | ICD-10-CM

## 2017-07-25 LAB — ANA W/REFLEX IF POSITIVE: Anti Nuclear Antibody(ANA): NEGATIVE

## 2017-07-25 LAB — SJOGRENS SYNDROME-A EXTRACTABLE NUCLEAR ANTIBODY

## 2017-07-25 LAB — BASIC METABOLIC PANEL
ANION GAP: 13 (ref 5–15)
BUN: 38 mg/dL — ABNORMAL HIGH (ref 6–20)
CALCIUM: 8.8 mg/dL — AB (ref 8.9–10.3)
CHLORIDE: 95 mmol/L — AB (ref 101–111)
CO2: 27 mmol/L (ref 22–32)
Creatinine, Ser: 2.2 mg/dL — ABNORMAL HIGH (ref 0.61–1.24)
GFR calc Af Amer: 38 mL/min — ABNORMAL LOW (ref >60)
GFR calc non Af Amer: 32 mL/min — ABNORMAL LOW (ref >60)
GLUCOSE: 137 mg/dL — AB (ref 65–99)
Potassium: 3.8 mmol/L (ref 3.5–5.1)
Sodium: 135 mmol/L (ref 135–145)

## 2017-07-25 LAB — SYNOVIAL CELL COUNT + DIFF, W/ CRYSTALS
Lymphocytes-Synovial Fld: 19 % (ref 0–20)
MONOCYTE-MACROPHAGE-SYNOVIAL FLUID: 68 % (ref 50–90)
Neutrophil, Synovial: 13 % (ref 0–25)
WBC, SYNOVIAL: 935 /mm3 — AB (ref 0–200)

## 2017-07-25 LAB — CBC
HEMATOCRIT: 41.2 % (ref 39.0–52.0)
HEMOGLOBIN: 13.4 g/dL (ref 13.0–17.0)
MCH: 29.1 pg (ref 26.0–34.0)
MCHC: 32.5 g/dL (ref 30.0–36.0)
MCV: 89.4 fL (ref 78.0–100.0)
Platelets: 405 10*3/uL — ABNORMAL HIGH (ref 150–400)
RBC: 4.61 MIL/uL (ref 4.22–5.81)
RDW: 12.8 % (ref 11.5–15.5)
WBC: 7.6 10*3/uL (ref 4.0–10.5)

## 2017-07-25 LAB — IMMUNOFIXATION ELECTROPHORESIS
IGA: 213 mg/dL (ref 90–386)
IGG (IMMUNOGLOBIN G), SERUM: 1556 mg/dL (ref 700–1600)
IGM (IMMUNOGLOBULIN M), SRM: 90 mg/dL (ref 20–172)
Total Protein ELP: 7.1 g/dL (ref 6.0–8.5)

## 2017-07-25 LAB — PROTEIN ELECTROPHORESIS, SERUM
A/G RATIO SPE: 0.7 (ref 0.7–1.7)
ALBUMIN ELP: 2.8 g/dL — AB (ref 2.9–4.4)
ALPHA-1-GLOBULIN: 0.4 g/dL (ref 0.0–0.4)
ALPHA-2-GLOBULIN: 1 g/dL (ref 0.4–1.0)
BETA GLOBULIN: 1.3 g/dL (ref 0.7–1.3)
GLOBULIN, TOTAL: 4.1 g/dL — AB (ref 2.2–3.9)
Gamma Globulin: 1.4 g/dL (ref 0.4–1.8)
Total Protein ELP: 6.9 g/dL (ref 6.0–8.5)

## 2017-07-25 LAB — SJOGRENS SYNDROME-B EXTRACTABLE NUCLEAR ANTIBODY: SSB (La) (ENA) Antibody, IgG: <0.2 AI (ref 0.0–0.9)

## 2017-07-25 LAB — HEPATITIS C ANTIBODY (REFLEX): HCV Ab: <0.1 s/co ratio (ref 0.0–0.9)

## 2017-07-25 LAB — HCV COMMENT:

## 2017-07-25 MED ORDER — LIDOCAINE HCL 1 % IJ SOLN
5.0000 mL | Freq: Once | INTRAMUSCULAR | Status: AC
  Administered 2017-07-25: 5 mL
  Filled 2017-07-25: qty 5

## 2017-07-25 MED ORDER — BUPIVACAINE HCL (PF) 0.5 % IJ SOLN
10.0000 mL | Freq: Once | INTRAMUSCULAR | Status: AC
  Administered 2017-07-25: 10 mL
  Filled 2017-07-25: qty 10

## 2017-07-25 MED ORDER — PREDNISONE 50 MG PO TABS
50.0000 mg | ORAL_TABLET | Freq: Every day | ORAL | Status: DC
  Administered 2017-07-26 – 2017-07-27 (×2): 50 mg via ORAL
  Filled 2017-07-25 (×2): qty 1

## 2017-07-25 MED ORDER — METHYLPREDNISOLONE ACETATE 40 MG/ML IJ SUSP
40.0000 mg | Freq: Once | INTRAMUSCULAR | Status: AC
  Administered 2017-07-25: 40 mg via INTRA_ARTICULAR
  Filled 2017-07-25: qty 1

## 2017-07-25 NOTE — Consult Note (Signed)
Reason for Consult: Acute kidney injury on chronic kidney disease stage III Referring Physician: Payton MccallumJeffrey Walden MD  HPI:  52 year old African [place] man with past medical history significant for hypertension, chronic diastolic congestive heart failure (EF 65-70%) and chronic kidney disease stage III at baseline (creatinine 1.8-2.0).  Admitted to the hospital 5 days ago with increasing bilateral lower extremity numbness, weakness and pain along with some swelling.  He continues to report the symptoms and has not had any relief from the therapy so far or any incriminating etiology identified-continues ongoing workup and management by neurology.  Concern raised with his creatinine rising to 2.5 on admission although has improved to 2.2 at this time without clear explanation.  He has not been oliguric and denies taking any nonsteroidal anti-inflammatory drugs prior to admission.  He denies any recent antibiotics or hematuria.  He denies any prior history of acute kidney injury and was not aware that he had chronic kidney disease.  He denies any strong family history of renal disease or autoimmune disorders.  Renal ultrasound done 3 days ago showed cortical thinning without any hydronephrosis.  Urine electrolytes suggestive of intravascular volume contraction.  He is undergoing workup for amyloidosis/plasma cell dyscrasia.  Past Medical History:  Diagnosis Date  . Chest pain    a. In setting of HTN Urgency w/ trop elevation-->05/2016 MV: no ischemia-->low risk.  . Chronic diastolic CHF (congestive heart failure) (HCC)    a. 10/2015 Echo: EF 55-60%, no rwma; b. 05/2016 Echo: EF 65-70%, sev LVH, Gr2 DD, triv AI, mildly dil LA, nl RV size/fxn, mildly-mod dil RA.  . CKD (chronic kidney disease), stage III (HCC)   . Dilated aortic root (HCC)    a. 10/2015 Echo: Ao root 41mm;  b. 05/2016 Echo: triv AI.  Marland Kitchen. Elevated troponin    a. 10/2015, 05/2016, & 10/2016 in the setting of HTN Urgency;  b. 05/2016 MV: no  ischemia-->Low risk.  . H/O medication noncompliance   . Hypertension     Past Surgical History:  Procedure Laterality Date  . CHOLECYSTECTOMY    . HERNIA REPAIR      Family History  Problem Relation Age of Onset  . Hypertension Mother   . Heart attack Mother   . CVA Mother   . Heart disease Sister     Social History:  reports that he quit smoking about 23 months ago. He has never used smokeless tobacco. He reports that he does not drink alcohol or use drugs.  Allergies:  Allergies  Allergen Reactions  . Codeine Palpitations  . Strawberry (Diagnostic) Rash and Other (See Comments)    "Tongue swells/feels heavy"    Medications:  Scheduled: . amitriptyline  25 mg Oral QHS  . aspirin  81 mg Oral Daily  . bupivacaine  10 mL Infiltration Once  . carvedilol  12.5 mg Oral BID WC  . heparin  5,000 Units Subcutaneous Q8H  . lidocaine  5 mL Other Once  . methylPREDNISolone acetate  40 mg Intra-articular Once  . [START ON 07/26/2017] predniSONE  50 mg Oral Q breakfast  . spironolactone  25 mg Oral Daily  . torsemide  60 mg Oral TID    BMP Latest Ref Rng & Units 07/25/2017 07/24/2017 07/22/2017  Glucose 65 - 99 mg/dL 161(W137(H) 960(A128(H) 540(J144(H)  BUN 6 - 20 mg/dL 81(X38(H) 91(Y38(H) 78(G37(H)  Creatinine 0.61 - 1.24 mg/dL 9.56(O2.20(H) 1.30(Q2.29(H) 6.57(Q2.49(H)  BUN/Creat Ratio 9 - 20 - - -  Sodium 135 - 145 mmol/L 135 135 135  Potassium 3.5 - 5.1 mmol/L 3.8 3.3(L) 3.8  Chloride 101 - 111 mmol/L 95(L) 93(L) 94(L)  CO2 22 - 32 mmol/L 27 28 25   Calcium 8.9 - 10.3 mg/dL 9.1(Y) 8.9 7.8(G)   CBC Latest Ref Rng & Units 07/25/2017 07/24/2017 07/21/2017  WBC 4.0 - 10.5 K/uL 7.6 7.3 9.5  Hemoglobin 13.0 - 17.0 g/dL 95.6 12.8(L) 14.0  Hematocrit 39.0 - 52.0 % 41.2 39.5 42.9  Platelets 150 - 400 K/uL 405(H) 374 310     No results found.  ROS Blood pressure (!) 118/96, pulse 78, temperature 98.1 F (36.7 C), resp. rate 16, height 6' (1.829 m), weight 135.9 kg (299 lb 9.7 oz), SpO2 90 %. Physical Exam  Nursing note and  vitals reviewed. Constitutional: He is oriented to person, place, and time. He appears well-developed and well-nourished. He appears distressed.  HENT:  Head: Atraumatic.  Mouth/Throat: Oropharynx is clear and moist.  Eyes: Pupils are equal, round, and reactive to light. Conjunctivae and EOM are normal. No scleral icterus.  Neck: Normal range of motion. Neck supple. No JVD present. No thyromegaly present.  Cardiovascular: Normal rate, regular rhythm and normal heart sounds.  No murmur heard. Respiratory: Effort normal and breath sounds normal. He has no wheezes. He has no rales.  GI: Soft. Bowel sounds are normal. There is no tenderness. There is no rebound and no guarding.  Musculoskeletal: He exhibits edema.  Trace-1+ edema of ankles.  Right arm in Ace wrap.  Neurological: He is alert and oriented to person, place, and time.  Skin: Skin is warm and dry. No rash noted. No erythema.    Assessment/Plan: 1.  Acute kidney injury on chronic kidney disease stage III: Based on the history, timeline of events and available databases, appears to be possibly hemodynamically mediated with intravascular volume contraction/renal hypoperfusion.  Fortunately, he has excellent urine output and renal function appears to be improving on sequential labs.  Continue to avoid nonsteroidal anti-inflammatory drugs, iodinated intravenous contrast and agree with workup for amyloidosis/plasma cell dyscrasia.  Dipstick positive for proteinuria-we will quantify urine protein/creatinine ratio. 2.  Bilateral lower extremity weakness/pain/numbness: Ongoing workup by neurology with adjustment of medications including discontinuation of gabapentin and transitioning to amitriptyline noted. 3.  Hypertension: Blood pressures under fair control, will continue to follow this closely for adjustments of management. 4.  Right knee effusion: Orthopedic surgery consulted for joint aspiration.  Aarionna Germer K. 07/25/2017, 2:29 PM

## 2017-07-25 NOTE — Consult Note (Signed)
Physical Medicine and Rehabilitation Consult Reason for Consult: Decreased functional mobility Referring Physician: Internal medicine   HPI: Trevor Nunez is a 52 y.o. right-handed male with history of tobacco abuse and quit 23 months ago, morbid obesity, hypertension, CKD stage III, chronic diastolic congestive heart failure, medical noncompliance.  Per chart review [name]  Independent up until 2 weeks ago.  He is unemployed.  Roommate does not work.  No local family.  Presented 07/20/2017 with lower extremity weakness times 2 weeks as well as significant pain anterior lateral aspect of left leg.  Noted fall landing on his right arm.  MRI of the brain negative.  MRI lumbar, cervical and thoracic spine showed lumbar spondylosis predominantly at L4-5 and L5-S1 levels.  No significant canal stenosis and without fracture.  There was some neural foraminal narrowing at C3-4 through C6-7 moderate to severe on the right at C4-5.  X-rays of the right knee showed a large joint effusion with synovitis.  Small radial tear of the medial meniscus body.  High-grade cartilage loss in the medial and patellofemoral compartments.  Noted creatinine 2.19, troponin 0 0.09.  Renal ultrasound negative.  Underwent right knee aspiration of joint effusion 07/25/2017.  Neurology follow-up for lower extremity weakness noted unifying nonneurological etiology diagnosis tying together his edema, joint effusion and renal failure with workup ongoing.  Subcutaneous heparin for DVT prophylaxis.  Venous Doppler studies lower extremities negative.  Therapy evaluations completed and ongoing with recommendations of physical medicine rehab consult.   Review of Systems  Constitutional: Negative for fever.  HENT: Negative for hearing loss.   Eyes: Negative for blurred vision and double vision.  Respiratory: Negative for shortness of breath.   Cardiovascular: Positive for leg swelling.  Negative for chest pain.  Gastrointestinal: Positive for constipation. Negative for nausea.  Genitourinary: Negative for dysuria, flank pain and hematuria.  Musculoskeletal: Positive for joint pain and myalgias.  Skin: Negative for rash.  Neurological: Positive for weakness. Negative for seizures.  All other systems reviewed and are negative.  Past Medical History:  Diagnosis Date  . Chest pain    a. In setting of HTN Urgency w/ trop elevation-->05/2016 MV: no ischemia-->low risk.  . Chronic diastolic CHF (congestive heart failure) (HCC)    a. 10/2015 Echo: EF 55-60%, no rwma; b. 05/2016 Echo: EF 65-70%, sev LVH, Gr2 DD, triv AI, mildly dil LA, nl RV size/fxn, mildly-mod dil RA.  . CKD (chronic kidney disease), stage III (HCC)   . Dilated aortic root (HCC)    a. 10/2015 Echo: Ao root 41mm;  b. 05/2016 Echo: triv AI.  Marland Kitchen. Elevated troponin    a. 10/2015, 05/2016, & 10/2016 in the setting of HTN Urgency;  b. 05/2016 MV: no ischemia-->Low risk.  . H/O medication noncompliance   . Hypertension    Past Surgical History:  Procedure Laterality Date  . CHOLECYSTECTOMY    . HERNIA REPAIR     Family History  Problem Relation Age of Onset  . Hypertension Mother   . Heart attack Mother   . CVA Mother   . Heart disease Sister    Social History:  reports that he quit smoking about 23 months ago. He has never used smokeless tobacco. He reports that he does not drink alcohol or use drugs. Allergies:  Allergies  Allergen Reactions  . Codeine Palpitations  . Strawberry (Diagnostic) Rash and Other (See Comments)    "Tongue swells/feels heavy"   Medications  Prior to Admission  Medication Sig Dispense Refill  . aspirin 81 MG chewable tablet Chew 1 tablet (81 mg total) by mouth daily. 90 tablet 3  . carvedilol (COREG) 12.5 MG tablet Take 12.5 mg by mouth 2 (two) times daily with a meal.    . hydrALAZINE (APRESOLINE) 50 MG tablet Take 50 mg by mouth 3 (three) times daily.    . potassium chloride SA  (K-DUR,KLOR-CON) 20 MEQ tablet Take 1 tablet (20 mEq total) by mouth 2 (two) times daily. And take extra when taking metolazone 180 tablet 3  . spironolactone (ALDACTONE) 25 MG tablet Take 25 mg by mouth daily.    Marland Kitchen torsemide (DEMADEX) 20 MG tablet Take 60 mg by mouth 3 (three) times daily.     Marland Kitchen allopurinol (ZYLOPRIM) 100 MG tablet Take 1 tablet (100 mg total) by mouth daily. (Patient not taking: Reported on 07/20/2017) 30 tablet 0  . cyclobenzaprine (FLEXERIL) 5 MG tablet Take 1-2 tablets 3 times daily as needed (Patient not taking: Reported on 07/20/2017) 20 tablet 0  . lidocaine (LIDODERM) 5 % Place 1 patch onto the skin every 12 (twelve) hours. Remove & Discard patch within 12 hours or as directed by MD (Patient not taking: Reported on 07/20/2017) 10 patch 0  . metolazone (ZAROXOLYN) 2.5 MG tablet Take 1 tablet (2.5 mg total) by mouth daily. (Patient not taking: Reported on 07/20/2017) 3 tablet 0  . nitroGLYCERIN (NITROSTAT) 0.4 MG SL tablet Place 1 tablet (0.4 mg total) under the tongue every 5 (five) minutes as needed for chest pain. 20 tablet 1  . oxyCODONE-acetaminophen (PERCOCET) 5-325 MG tablet Take 1 tablet by mouth every 4 (four) hours as needed for severe pain. 6 tablet 0    Home: Home Living Family/patient expects to be discharged to:: Private residence Living Arrangements: Other relatives("roommate") Available Help at Discharge: Family, Friend(s) Type of Home: Apartment Home Access: Level entry Home Layout: One level Firefighter: Standard Home Equipment: None  Functional History: Prior Function Level of Independence: Independent Comments: pt was living at home with other family and has been walking with no AD Functional Status:  Mobility: Bed Mobility Overal bed mobility: Needs Assistance Bed Mobility: Rolling, Sidelying to Sit Rolling: Mod assist Sidelying to sit: Mod assist General bed mobility comments: +rail, increased time and effort Transfers Overall transfer  level: Needs assistance Equipment used: Rolling walker (2 wheeled) Transfers: Sit to/from Stand, Stand Pivot Transfers Sit to Stand: +2 safety/equipment, Mod assist Stand pivot transfers: Min assist, +2 safety/equipment General transfer comment: Pt able to take pivot steps bed to recliner with RW maintaining a flexed posture. Verbal cues for sequencing. Ambulation/Gait General Gait Details: unable due to pain    ADL: ADL Overall ADL's : Needs assistance/impaired Eating/Feeding: Set up, Bed level Eating/Feeding Details (indicate cue type and reason): setup assist to open drink containers Grooming: Set up, Bed level, Wash/dry face General ADL Comments: setup assist provided for bed level grooming ADLs; pt significantly limited due to pain/fatigue, requiring max-totalA for LB ADLs at bed level   Cognition: Cognition Overall Cognitive Status: Within Functional Limits for tasks assessed Orientation Level: Oriented X4 Cognition Arousal/Alertness: Awake/alert Behavior During Therapy: Flat affect, Agitated Overall Cognitive Status: Within Functional Limits for tasks assessed  Blood pressure (!) 118/96, pulse 78, temperature 98.1 F (36.7 C), resp. rate 16, height 6' (1.829 m), weight 135.9 kg (299 lb 9.7 oz), SpO2 90 %. Physical Exam  Vitals reviewed. Constitutional: He is oriented to person, place, and time.  HENT:  Head: Normocephalic.  Eyes: EOM are normal.  Neck: Normal range of motion. Neck supple. No thyromegaly present.  Cardiovascular: Normal rate, regular rhythm and normal heart sounds.  Respiratory: Effort normal and breath sounds normal. No respiratory distress.  GI: Soft. Bowel sounds are normal. He exhibits no distension.  Musculoskeletal:  Right tender, effusion  Neurological: He is alert and oriented to person, place, and time.  Skin: Skin is warm and dry.    Results for orders placed or performed during the hospital encounter of 07/20/17 (from the past 24 hour(s))   CBC     Status: Abnormal   Collection Time: 07/25/17  3:56 AM  Result Value Ref Range   WBC 7.6 4.0 - 10.5 K/uL   RBC 4.61 4.22 - 5.81 MIL/uL   Hemoglobin 13.4 13.0 - 17.0 g/dL   HCT 16.1 09.6 - 04.5 %   MCV 89.4 78.0 - 100.0 fL   MCH 29.1 26.0 - 34.0 pg   MCHC 32.5 30.0 - 36.0 g/dL   RDW 40.9 81.1 - 91.4 %   Platelets 405 (H) 150 - 400 K/uL  Basic metabolic panel     Status: Abnormal   Collection Time: 07/25/17  3:56 AM  Result Value Ref Range   Sodium 135 135 - 145 mmol/L   Potassium 3.8 3.5 - 5.1 mmol/L   Chloride 95 (L) 101 - 111 mmol/L   CO2 27 22 - 32 mmol/L   Glucose, Bld 137 (H) 65 - 99 mg/dL   BUN 38 (H) 6 - 20 mg/dL   Creatinine, Ser 7.82 (H) 0.61 - 1.24 mg/dL   Calcium 8.8 (L) 8.9 - 10.3 mg/dL   GFR calc non Af Amer 32 (L) >60 mL/min   GFR calc Af Amer 38 (L) >60 mL/min   Anion gap 13 5 - 15   No results found.  Assessment/Plan: Diagnosis: 52 yo morbidly obese male with Mobility and functional deficits secondary to ?septic arthritis right knee, unclear etiology at this point for lower extremity sensory loss/weakness 1. Does the need for close, 24 hr/day medical supervision in concert with the patient's rehab needs make it unreasonable for this patient to be served in a less intensive setting? Yes 2. Co-Morbidities requiring supervision/potential complications: chf, ckd, htn 3. Due to bladder management, bowel management, safety, skin/wound care, disease management, medication administration, pain management and patient education, does the patient require 24 hr/day rehab nursing? Yes 4. Does the patient require coordinated care of a physician, rehab nurse, PT (1-2 hrs/day, 5 days/week) and OT (1-2 hrs/day, 5 days/week) to address physical and functional deficits in the context of the above medical diagnosis(es)? Yes Addressing deficits in the following areas: balance, endurance, locomotion, strength, transferring, bowel/bladder control, bathing, dressing, feeding,  grooming, toileting and psychosocial support 5. Can the patient actively participate in an intensive therapy program of at least 3 hrs of therapy per day at least 5 days per week? Yes 6. The potential for patient to make measurable gains while on inpatient rehab is good 7. Anticipated functional outcomes upon discharge from inpatient rehab are modified independent  with PT, modified independent with OT, n/a with SLP. 8. Estimated rehab length of stay to reach the above functional goals is: potentially 8-13 days 9. Anticipated D/C setting: Home 10. Anticipated post D/C treatments: HH therapy 11. Overall Rehab/Functional Prognosis: excellent  RECOMMENDATIONS: This patient's condition is appropriate for continued rehabilitative care in the following setting: CIR Patient has agreed to participate in recommended program. Potentially Note that insurance  prior authorization may be required for reimbursement for recommended care.  Comment: Rehab Admissions Coordinator to follow up.  Thanks,  Ranelle Oyster, MD, [place] [facility]    Mcarthur Rossetti Angiulli, PA-C 07/25/2017

## 2017-07-25 NOTE — Consult Note (Signed)
Reason for Consult:Knee effusion Referring Physician: Facundo Allemand is an 52 y.o. male.  HPI: Trevor Nunez was admitted last week with acute onset BLE pain and weakness. He says he woke up that way on Monday. It was so bad that he couldn't move his legs at all. The pain he describes as throughout both of his legs and neuropathic in nature. He has a preexisting lumbar issue on the left side that he's getting treatment for. To date there is no definitive diagnosis of his pain. His knee was noted to be swollen on the right side however and a MR was obtained that showed a large effusion. He does have a hx/o gout in his feet. No prior e/o knee swelling.  Past Medical History:  Diagnosis Date  . Chest pain    a. In setting of HTN Urgency w/ trop elevation-->05/2016 MV: no ischemia-->low risk.  . Chronic diastolic CHF (congestive heart failure) ([facility])    a. 10/2015 Echo: EF 55-60%, no rwma; b. 05/2016 Echo: EF 65-70%, sev LVH, Gr2 DD, triv AI, mildly dil LA, nl RV size/fxn, mildly-mod dil RA.  . CKD (chronic kidney disease), stage III ([facility])   . Dilated aortic root ([facility])    a. 10/2015 Echo: Ao root 42m;  b. 05/2016 Echo: triv AI.  .Marland KitchenElevated troponin    a. 10/2015, 05/2016, & 10/2016 in the setting of HTN Urgency;  b. 05/2016 MV: no ischemia-->Low risk.  . H/O medication noncompliance   . Hypertension     Past Surgical History:  Procedure Laterality Date  . CHOLECYSTECTOMY    . HERNIA REPAIR      Family History  Problem Relation Age of Onset  . Hypertension Mother   . Heart attack Mother   . CVA Mother   . Heart disease Sister     Social History:  reports that he quit smoking about 23 months ago. He has never used smokeless tobacco. He reports that he does not drink alcohol or use drugs.  Allergies:  Allergies  Allergen Reactions  . Codeine Palpitations  . Strawberry (Diagnostic) Rash and Other (See Comments)    "Tongue swells/feels heavy"    Medications: I have reviewed the patient's  current medications.  Results for orders placed or performed during the hospital encounter of 07/20/17 (from the past 48 hour(s))  Basic metabolic panel     Status: Abnormal   Collection Time: 07/24/17  3:45 AM  Result Value Ref Range   Sodium 135 135 - 145 mmol/L   Potassium 3.3 (L) 3.5 - 5.1 mmol/L   Chloride 93 (L) 101 - 111 mmol/L   CO2 28 22 - 32 mmol/L   Glucose, Bld 128 (H) 65 - 99 mg/dL   BUN 38 (H) 6 - 20 mg/dL   Creatinine, Ser 2.29 (H) 0.61 - 1.24 mg/dL   Calcium 8.9 8.9 - 10.3 mg/dL   GFR calc non Af Amer 31 (L) >60 mL/min   GFR calc Af Amer 36 (L) >60 mL/min    Comment: (NOTE) The eGFR has been calculated using the CKD EPI equation. This calculation has not been validated in all clinical situations. eGFR's persistently <60 mL/min signify possible Chronic Kidney Disease.    Anion gap 14 5 - 15    Comment: Performed at [id number] [place] [id number] CBC     Status: Abnormal   Collection Time: 07/24/17  3:45 AM  Result Value Ref Range   WBC 7.3  4.0 - 10.5 K/uL   RBC 4.43 4.22 - 5.81 MIL/uL   Hemoglobin 12.8 (L) 13.0 - 17.0 g/dL   HCT 39.5 39.0 - 52.0 %   MCV 89.2 78.0 - 100.0 fL   MCH 28.9 26.0 - 34.0 pg   MCHC 32.4 30.0 - 36.0 g/dL   RDW 12.8 11.5 - 15.5 %   Platelets 374 150 - 400 K/uL    Comment: Performed at [facility], [place] [address], [place], [place] [address]  Magnesium     Status: None   Collection Time: 07/24/17 12:30 PM  Result Value Ref Range   Magnesium 2.0 1.7 - 2.4 mg/dL    Comment: Performed at [facility] [address] [place], [place] [address]  Phosphorus     Status: None   Collection Time: 07/24/17 12:30 PM  Result Value Ref Range   Phosphorus 3.8 2.5 - 4.6 mg/dL    Comment: Performed at [facility] [address], [place], [place] [address]  CBC     Status: Abnormal   Collection Time: 07/25/17  3:56 AM  Result Value Ref Range   WBC 7.6 4.0 - 10.5 K/uL   RBC 4.61 4.22 - 5.81  MIL/uL   Hemoglobin 13.4 13.0 - 17.0 g/dL   HCT 41.2 39.0 - 52.0 %   MCV 89.4 78.0 - 100.0 fL   MCH 29.1 26.0 - 34.0 pg   MCHC 32.5 30.0 - 36.0 g/dL   RDW 12.8 11.5 - 15.5 %   Platelets 405 (H) 150 - 400 K/uL    Comment: Performed at [facility], [place] [address], [place], [place] [address]  Basic metabolic panel     Status: Abnormal   Collection Time: 07/25/17  3:56 AM  Result Value Ref Range   Sodium 135 135 - 145 mmol/L   Potassium 3.8 3.5 - 5.1 mmol/L   Chloride 95 (L) 101 - 111 mmol/L   CO2 27 22 - 32 mmol/L   Glucose, Bld 137 (H) 65 - 99 mg/dL   BUN 38 (H) 6 - 20 mg/dL   Creatinine, Ser 2.20 (H) 0.61 - 1.24 mg/dL   Calcium 8.8 (L) 8.9 - 10.3 mg/dL   GFR calc non Af Amer 32 (L) >60 mL/min   GFR calc Af Amer 38 (L) >60 mL/min    Comment: (NOTE) The eGFR has been calculated using the CKD EPI equation. This calculation has not been validated in all clinical situations. eGFR's persistently <60 mL/min signify possible Chronic Kidney Disease.    Anion gap 13 5 - 15    Comment: Performed at [facility] [address], [place], [place] [address]    No results found.  Review of Systems  Constitutional: Negative for weight loss.  HENT: Negative for ear discharge, ear pain, hearing loss and tinnitus.   Eyes: Negative for blurred vision, double vision, photophobia and pain.  Respiratory: Negative for cough, sputum production and shortness of breath.   Cardiovascular: Negative for chest pain.  Gastrointestinal: Negative for abdominal pain, nausea and vomiting.  Genitourinary: Negative for dysuria, flank pain, frequency and urgency.  Musculoskeletal: Positive for myalgias. Negative for back pain, falls, joint pain and neck pain.  Neurological: Positive for sensory change and focal weakness. Negative for dizziness, tingling, loss of consciousness and headaches.  Endo/Heme/Allergies: Does not bruise/bleed easily.  Psychiatric/Behavioral: Negative for depression, memory  loss and substance abuse. The patient is not nervous/anxious.    Blood pressure Marland Kitchen)  118/96, pulse 78, temperature 98.1 F (36.7 C), resp. rate 16, height 6' (1.829 m), weight 135.9 kg (299 lb 9.7 oz), SpO2 90 %. Physical Exam  Constitutional: He appears well-developed and well-nourished. No distress.  HENT:  Head: Normocephalic and atraumatic.  Eyes: Conjunctivae are normal. Right eye exhibits no discharge. Left eye exhibits no discharge. No scleral icterus.  Neck: Normal range of motion.  Cardiovascular: Normal rate and regular rhythm.  Respiratory: Effort normal. No respiratory distress.  Musculoskeletal:  RLE No traumatic wounds, ecchymosis, or rash  Severe TTP, PROM ~15 degrees  No ankle effusion, large knee effusion  Could not assess knee stability  Sens DPN, SPN, TN intact  Motor EHL, ext, flex, evers 2/5  DP 2+, PT 2+, No significant edema  LLE No traumatic wounds, ecchymosis, or rash  Severe TTP, PROM ~15 degrees  No knee or ankle effusion  Could not assess knee stability  Sens DPN, SPN, TN intact  Motor EHL, ext, flex, evers 2/5  DP 2+, PT 2+, No significant edema  Neurological: He is alert.  Skin: Skin is warm and dry. He is not diaphoretic.  Psychiatric: He has a normal mood and affect. His behavior is normal.    Assessment/Plan: Right knee effusion -- Will aspirate and inject knee, suspect gout though could be reactive arthritis. Low likelihood to be septic.    Lisette Abu, PA-C Orthopedic Surgery [phone] 07/25/2017, 2:13 PM

## 2017-07-25 NOTE — Progress Notes (Addendum)
Subjective: No subjective change to lower extremity sensory numbness and weakness. Patient states that symptoms are worse on the right compared to the left.   Objective: Current vital signs: BP (!) 118/96 (BP Location: Left Arm)   Pulse 78   Temp 98.1 F (36.7 C)   Resp 16   Ht 6' (1.829 m)   Wt 135.9 kg (299 lb 9.7 oz)   SpO2 90%   BMI 40.63 kg/m  Vital signs in last 24 hours: Temp:  [98.1 F (36.7 C)-98.7 F (37.1 C)] 98.1 F (36.7 C) (04/08 0503) Pulse Rate:  [75-88] 78 (04/08 0503) Resp:  [12-25] 16 (04/08 0503) BP: (93-138)/(71-96) 118/96 (04/08 0503) SpO2:  [90 %-99 %] 90 % (04/08 0503) Weight:  [135.9 kg (299 lb 9.7 oz)] 135.9 kg (299 lb 9.7 oz) (04/08 0503)  Intake/Output from previous day: 04/07 0701 - 04/08 0700 In: -  Out: 2395 [Urine:2395] Intake/Output this shift: Total I/O In: -  Out: 250 [Urine:250] Nutritional status: Diet Heart Room service appropriate? Yes; Fluid consistency: Thin  General: WDWN Ext: Pretibial edema bilaterally, more prominent on the right. Right knee swelling noted. Ace bandage to RUE noted.   Neurologic Exam: Ment: Awake and alert with intact speech. No evidence for aphasia.  CN: EOM are normal. No facial droop noted. Temp sensation equal bilaterally Motor: RLE 3-4/5 with inconsistent effort and limitation to movement due to pain; guarding noted. LLE 4/5 hip flexion, knee extension and ankle dorsi/plantar flexion, with inconsistent effort.  Sensory: Intact temp sensation bilateral upper ext. Decreased temp sensation to RLE and LLE with proximal-distal gradient; temp sensation is subjectively worse on the right. Relatively preserved vibration and proprioception. Cerebellar: No gross ataxia noted.   Lab Results: Results for orders placed or performed during the hospital encounter of 07/20/17 (from the past 48 hour(s))  Glucose, CSF     Status: Abnormal   Collection Time: 07/23/17 11:25 AM  Result Value Ref Range   Glucose, CSF 74  (H) 40 - 70 mg/dL    Comment: Performed at [facility] [address], [place], [place] [address]  Protein, CSF     Status: None   Collection Time: 07/23/17 11:25 AM  Result Value Ref Range   Total  Protein, CSF 43 15 - 45 mg/dL    Comment: Performed at [facility] [address], [place], [place] [address]  CSF cell count with differential     Status: None   Collection Time: 07/23/17 11:25 AM  Result Value Ref Range   Tube # 1    Color, CSF COLORLESS COLORLESS   Appearance, CSF CLEAR CLEAR   Supernatant NOT INDICATED    RBC Count, CSF 0 0 /cu mm   WBC, CSF 2 0 - 5 /cu mm   Other Cells, CSF TOO FEW TO COUNT, SMEAR AVAILABLE FOR REVIEW     Comment: RARE LYMPHS. Performed at [facility], [place] [address], [place], [place] [address]   CSF culture     Status: None (Preliminary result)   Collection Time: 07/23/17 11:25 AM  Result Value Ref Range   Specimen Description CSF    Special Requests NONE    Gram Stain      WBC PRESENT, PREDOMINANTLY MONONUCLEAR NO ORGANISMS SEEN CYTOSPIN SMEAR    Culture      NO GROWTH 1 DAY Performed at [facility] [address] [place], [place] [address]    Report Status PENDING   Basic  metabolic panel     Status: Abnormal   Collection Time: 07/24/17  3:45 AM  Result Value Ref Range   Sodium 135 135 - 145 mmol/L   Potassium 3.3 (L) 3.5 - 5.1 mmol/L   Chloride 93 (L) 101 - 111 mmol/L   CO2 28 22 - 32 mmol/L   Glucose, Bld 128 (H) 65 - 99 mg/dL   BUN 38 (H) 6 - 20 mg/dL   Creatinine, Ser 2.29 (H) 0.61 - 1.24 mg/dL   Calcium 8.9 8.9 - 10.3 mg/dL   GFR calc non Af Amer 31 (L) >60 mL/min   GFR calc Af Amer 36 (L) >60 mL/min    Comment: (NOTE) The eGFR has been calculated using the CKD EPI equation. This calculation has not been validated in all clinical situations. eGFR's persistently <60 mL/min signify possible Chronic Kidney Disease.    Anion gap 14 5 - 15    Comment: Performed at [facility] [address] [place], [place] [address]  CBC     Status: Abnormal   Collection Time: 07/24/17  3:45 AM  Result Value Ref Range   WBC 7.3 4.0 - 10.5 K/uL   RBC 4.43 4.22 - 5.81 MIL/uL   Hemoglobin 12.8 (L) 13.0 - 17.0 g/dL   HCT 39.5 39.0 - 52.0 %   MCV 89.2 78.0 - 100.0 fL   MCH 28.9 26.0 - 34.0 pg   MCHC 32.4 30.0 - 36.0 g/dL   RDW 12.8 11.5 - 15.5 %   Platelets 374 150 - 400 K/uL    Comment: Performed at [facility], [place] [address] [place], [place] [address]  Magnesium     Status: None   Collection Time: 07/24/17 12:30 PM  Result Value Ref Range   Magnesium 2.0 1.7 - 2.4 mg/dL    Comment: Performed at [facility] [address], [place], [place] [address]  Phosphorus     Status: None   Collection Time: 07/24/17 12:30 PM  Result Value Ref Range   Phosphorus 3.8 2.5 - 4.6 mg/dL    Comment: Performed at [facility] [address] [place], [place] [address]  CBC     Status: Abnormal   Collection Time: 07/25/17  3:56 AM  Result Value Ref Range   WBC 7.6 4.0 - 10.5 K/uL   RBC 4.61 4.22 - 5.81 MIL/uL   Hemoglobin 13.4 13.0 - 17.0 g/dL   HCT 41.2 39.0 - 52.0 %   MCV 89.4 78.0 - 100.0 fL   MCH 29.1 26.0 - 34.0 pg   MCHC 32.5 30.0 - 36.0 g/dL   RDW 12.8 11.5 - 15.5 %   Platelets 405 (H) 150 - 400 K/uL    Comment: Performed at [facility], [place] [address], [place], [place] [address]  Basic metabolic panel     Status: Abnormal   Collection Time: 07/25/17  3:56 AM  Result Value Ref Range   Sodium 135 135 - 145 mmol/L   Potassium 3.8 3.5 - 5.1 mmol/L   Chloride 95 (L) 101 - 111 mmol/L   CO2 27 22 - 32 mmol/L   Glucose, Bld 137 (H) 65 - 99 mg/dL   BUN 38 (H) 6 - 20 mg/dL   Creatinine, Ser 2.20 (H) 0.61 - 1.24 mg/dL   Calcium 8.8 (L) 8.9 - 10.3 mg/dL   GFR calc non Af Amer 32 (L) >60 mL/min   GFR calc Af Amer 38 (L) >60  mL/min    Comment: (NOTE) The eGFR has been calculated using the CKD EPI equation. This calculation has not been validated in all  clinical situations. eGFR's persistently <60 mL/min signify possible Chronic Kidney Disease.    Anion gap 13 5 - 15    Comment: Performed at [facility] [address] [place], [place] [address]    Recent Results (from the past 240 hour(s))  CSF culture     Status: None (Preliminary result)   Collection Time: 07/23/17 11:25 AM  Result Value Ref Range Status   Specimen Description CSF  Final   Special Requests NONE  Final   Gram Stain   Final    WBC PRESENT, PREDOMINANTLY MONONUCLEAR NO ORGANISMS SEEN CYTOSPIN SMEAR    Culture   Final    NO GROWTH 1 DAY Performed at [facility], [place] [address] [place], [place] [address]    Report Status PENDING  Incomplete    Lipid Panel No results for input(s): CHOL, TRIG, HDL, CHOLHDL, VLDL, LDLCALC in the last 72 hours.  Studies/Results: Dg Fluoro Guide Lumbar Puncture  Result Date: 07/23/2017 CLINICAL DATA:  Generalized weakness EXAM: DIAGNOSTIC LUMBAR PUNCTURE UNDER FLUOROSCOPIC GUIDANCE FLUOROSCOPY TIME:  Fluoroscopy Time:  24 seconds Radiation Exposure Index (if provided by the fluoroscopic device): 30.3 mGy Number of Acquired Spot Images: 1 PROCEDURE: Informed consent was obtained from the patient prior to the procedure, including potential complications of headache, allergy, and pain. With the patient prone, the lower back was prepped with Betadine. 1% Lidocaine was used for local anesthesia. Lumbar puncture was performed at the L3-4 level using a 20 gauge needle with return of clear CSF with an opening pressure of 5.5 cm water. 13 ml of CSF were obtained for laboratory studies. The patient tolerated the procedure well and there were no apparent complications. IMPRESSION: Successful fluoroscopic guided lumbar puncture. Electronically Signed   By: Inez Catalina M.D.   On: 07/23/2017 11:52    Medications:  Scheduled: . allopurinol  100 mg Oral Daily  . amitriptyline  25 mg Oral QHS  . aspirin  81 mg Oral Daily  .  carvedilol  12.5 mg Oral BID WC  . heparin  5,000 Units Subcutaneous Q8H  . spironolactone  25 mg Oral Daily  . torsemide  60 mg Oral TID   Pertinent labs: CSF WBC-2 CSF RBC-0 CSF protein 43 (normal) CSF glucose 74  Impression: 52 year old male with severe lower extremity pain, edema, with loss of pain and temperature sensation.  1. Differential diagnosis includes pain secondary to his edema versus small fiber neuropathy.   2. With normal CSF protein, there is not enough evidence to support treating with IVIG as even with an albuminocytologic dissociation, this would be an unusual presentation for GBS.   3. There may be a unifying non-neurological etiology diagnosis tying together his edema, joint effusion, renal failure, elevated ESR and lower extremity pain. 4. MRI of brain without stroke or other focal brain lesion to explain the patient's presentation.  5. MRI of cervical/thoracic/lumbar spine without cord or cauda equina compression; no intrinsic spinal cord lesion seen. Regarding the paraspinal muscle atrophy described by Radiology on their official report for the spine imaging, I agree that there is atrophy after reviewing the images. Further work up with EMG of the paraspinal muscles should be undertaken.  6. Copper drawn last admission came back elevated; low copper as an etiology for the weakness therefore ruled out. B12 level was normal. TSH was  normal. Anti JO-1 Ab negative. HIV screen from March was nonreactive. RPR nonreactive.  Recommendations: 1. SPEP/IFX,  hepatitis c,  Ana, ssa, ssb 2. Consider skin biopsy as an outpatient if symptoms/signs do not resolve with improvement of his lower extremity edema.  3. Continue amitriptyline to help with pain. 4. Neurontin discontinued as patient reports no improvement with this.   5. Regarding the paraspinal muscle atrophy seen on recent spine imaging, further work up with focused EMG of the paraspinal muscles should be undertaken. If  this is uninformative, then a paraspinal muscle biopsy should be considered. I agree with Radiology that spinal muscular atrophy is on the DDx, but this is just one possibility out of a broad differential diagnosis, which includes disuse atrophy.    LOS: 5 days   _0  signed: Dr. Kerney Elbe 07/25/2017  8:39 AM

## 2017-07-25 NOTE — Procedures (Signed)
Procedure: Right knee aspiration and injection  Indication: Right knee effusion(s)  Surgeon: Charma IgoMichael Levon Penning, PA-C  Assist: None  Anesthesia: None  EBL: None  Complications: None  Findings: After risks/benefits explained patient desires to undergo procedure. Consent obtained and time out performed. The right knee was sterilely prepped and aspirated. 119ml clear pale yellow fluid obtained. 40mg  Depomedrol, 2.655ml each 1% lidocaine and 0.5% marcaine instilled. Pt tolerated the procedure well.    Freeman CaldronMichael J. Teaghan Formica, PA-C Orthopedic Surgery [phone]

## 2017-07-25 NOTE — Progress Notes (Signed)
Physical Therapy Treatment Patient Details Name: Trevor Nunez MRN: [id number] DOB: 05-Apr-1966 Today's Date: 07/25/2017    History of Present Illness 52 yo male with onset of low back pain and B thigh pain was admitted for evaluation. Pt had fall on 3/28 and has now elevated troponin and LE edema spec feet.  Previous workup with neurologist shows lumbar spondylosis and foraminal stenosis at L4-5 and L5-S1, as well as L lateral femoral cutaneous n entrapment.  PMHx:  CHF, morbid obesity, lateral femoral cutaneous n entrapment, HTN, CKD.    PT Comments    Pt received in bed. Appears agitated at request for participation in therapy but agreeable to transfer to recliner. Pt required mod assist bed mobility, mod assist sit to stand and min assist SPT. +2 assist utilized during transfer for safety. Pt sitting in recliner at end of session with chair alarm in place.     Follow Up Recommendations  CIR     Equipment Recommendations  None recommended by PT    Recommendations for Other Services Rehab consult     Precautions / Restrictions Precautions Precautions: Back;Fall    Mobility  Bed Mobility Overal bed mobility: Needs Assistance Bed Mobility: Rolling;Sidelying to Sit Rolling: Mod assist Sidelying to sit: Mod assist       General bed mobility comments: +rail, increased time and effort  Transfers Overall transfer level: Needs assistance Equipment used: Rolling walker (2 wheeled) Transfers: Sit to/from [employer] Transfers Sit to Stand: +2 safety/equipment;Mod assist Stand pivot transfers: Min assist;+2 safety/equipment       General transfer comment: Pt able to take pivot steps bed to recliner with RW maintaining a flexed posture. Verbal cues for sequencing.  Ambulation/Gait             General Gait Details: unable due to pain   Stairs            Wheelchair Mobility    Modified Rankin (Stroke Patients Only)       Balance Overall balance  assessment: Needs assistance Sitting-balance support: No upper extremity supported;Feet supported Sitting balance-Leahy Scale: Good     Standing balance support: Bilateral upper extremity supported;During functional activity Standing balance-Leahy Scale: Poor Standing balance comment: heavy reliance on RW                            Cognition Arousal/Alertness: Awake/alert Behavior During Therapy: Flat affect;Agitated Overall Cognitive Status: Within Functional Limits for tasks assessed                                        Exercises      General Comments        Pertinent Vitals/Pain Pain Assessment: 0-10 Pain Score: 10-Worst pain ever Pain Location: Abdomen  Pain Descriptors / Indicators: Grimacing;Sharp;Guarding Pain Intervention(s): Monitored during session;Repositioned    Home Living                      Prior Function            PT Goals (current goals can now be found in the care plan section) Acute Rehab PT Goals Patient Stated Goal: to get his pain controlled and get stronger PT Goal Formulation: With patient Time For Goal Achievement: 08/04/17 Potential to Achieve Goals: Good Progress towards PT goals: Progressing toward goals    Frequency  Min 2X/week      PT Plan Current plan remains appropriate    Co-evaluation              AM-PAC PT "6 Clicks" Daily Activity  Outcome Measure  Difficulty turning over in bed (including adjusting bedclothes, sheets and blankets)?: Unable Difficulty moving from lying on back to sitting on the side of the bed? : Unable Difficulty sitting down on and standing up from a chair with arms (e.g., wheelchair, bedside commode, etc,.)?: Unable Help needed moving to and from a bed to chair (including a wheelchair)?: A Lot Help needed walking in hospital room?: A Lot Help needed climbing 3-5 steps with a railing? : Total 6 Click Score: 8    End of Session Equipment Utilized  During Treatment: Gait belt Activity Tolerance: Patient tolerated treatment well Patient left: with call bell/phone within reach;with chair alarm set;in chair Nurse Communication: Mobility status PT Visit Diagnosis: Muscle weakness (generalized) (M62.81);Pain     Time: 0945-1001 PT Time Calculation (min) (ACUTE ONLY): 16 min  Charges:  $Therapeutic Activity: 8-22 mins                    G Codes:       Aida RaiderWendy Davyn Elsasser, PT  Office # [phone] Pager [phone]    Ilda FoilGarrow, Laurana Magistro Rene 07/25/2017, 10:35 AM

## 2017-07-25 NOTE — Progress Notes (Signed)
Family Medicine Teaching Service Daily Progress Note Intern Pager: [phone]  Patient name: Trevor Nunez Medical record number: [id number] Date of birth: 02-Mar-1966 Age: 52 y.o. Gender: male  Primary Care Provider: Barbette ReichmannHande, Vishwanath, MD Consultants: None Code Status: FULL   Pt Overview and Major Events to Date:  4/3 - admitted for bilateral LE weakness/pain   Assessment and Plan: Trevor Nunez is a 52 y.o. male presenting with BL LE weakness/pain. PMH is significant for HTN, CKD, HFpEF, morbid obesity.  Bilateral LE weakness and pain: Pain and weakness persist. S/p extensive workup with LP, CSF labs unrevealing. Strength exam appears to be improving day by day. PT/OT recommending CIR. Anti-Jo antibody, Hep C antibody normal. ANA negative. Sjogrens negative. SPEP/IFX within normal limits. Per neuro, can consider skin biopsy and paraspinal EMG outpatient, with possible paraspinal muscle biopsy. R knee continues to be swollen, MRI with noted effusion. Will consult ortho for drainage. Has h/o gout, on allopurinol. Will trial prednisone burst for alleviation of symptoms.  - Neuro following, appreciate recs  - consult ortho for R knee joint aspiration - continue amitriptyline 25 mg nightly for pain control  - PT/OT- CIR  - d/c allopurinol - trial prednisone burst 50mg  x5d  SVT on tele 8 beat VT followed by 6 beat run of SVT identified on tele at ~5PM 4/6.  Patient asymptomatic and no c/o chest pain. Mag wnl. - continue to monitor on tele   HFpEF:  Repeat ECHO EF 65-70%, with moderate diastolic dysfunction. BNP 67.3 on admission. Diuresed well on home torsemide with total outpt ~2.4L. Weight 304>299. Continues on home torsemide 60 mg TID and spironolactone daily.  - continue home torsemide 60 mg TID and spironolactone 25 mg daily.  - strict I's and O's  - daily weights   AoCKD:  Mild improvement this morning with SCr 2.19>>2.49>2.29 > 2.20 this am.  Baseline 1.8-2.0. Renal US 4/5 with no acute  findings or hydronephrosis. Urea nitrogen 357. Fe UREA 29% indicating prerenal etiology. 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  - monitor on daily BMP  - nephrology consulted, appreciate recs  HTN: Normotensive overnight with BP this AM 118/96.   Home regimen includes Coreg 12.5 mg BID, hydralazine 50 mg TID, Torsemide 60 mg TID.  - continue home meds   H/o gout Previously on allopurinol. Uric acid elevated at 13.4. R knee warm and swollen, could be gout related. - trial prednisone burst  R arm injury s/p fall: Currently in sugar tong cast.  Patient says he was told that it is possibly that he had a fracture though none noted on initial ED xrays, would need follow up with orthopedics. Appointment supposed to be on 4/4. - f/u with orthopedics outpatient   OSA: Recently diagnosis of OSA in February, still waiting on a CPAP machine at home  - CPAP at night, refusing   FEN/GI: heart healthy diet Prophylaxis: lovenox  Disposition: CIR  Subjective:  Patient endorses persistent pain and weakness. Frustrated at not having answers. Minimally verbally interactive on exam this morning.  Objective: Temp:  [98 F (36.7 C)-98.7 F (37.1 C)] 98 F (36.7 C) (04/08 1514) Pulse Rate:  [77-83] 83 (04/08 1514) Resp:  [12-16] 16 (04/08 1514) BP: (118-138)/(76-96) 132/76 (04/08 1514) SpO2:  [90 %-100 %] 100 % (04/08 1514) Weight:  [299 lb 9.7 oz (135.9 kg)] 299 lb 9.7 oz (135.9 kg) (04/08 0503)   Physical Exam: General: lying in bed, in NAD Cardiovascular: RRR, no MRG  Respiratory: CTAB,  no wheezes/rales/rhonchi Abdomen: nontender to palpation, no rebound or guarding, +BS MSK: ROM limited by pain, strength 5/5 to U/LE bilaterally.  Neuro: Alert and oriented, speech normal. Optic field normal. PERRL, Extraocular movements intact.  Intact symmetric sensation to light touch extremities bilaterally.  Hearing grossly intact bilaterally. Tongue  protrudes normally with no deviation.  Shoulder shrug, smile symmetric.   Laboratory: Recent Labs  Lab 07/21/17 0420 07/24/17 0345 07/25/17 0356  WBC 9.5 7.3 7.6  HGB 14.0 12.8* 13.4  HCT 42.9 39.5 41.2  PLT 310 374 405*   Recent Labs  Lab 07/21/17 2009 07/22/17 1426 07/24/17 0345 07/25/17 0356  NA 137 135 135 135  K 2.9* 3.8 3.3* 3.8  CL 93* 94* 93* 95*  CO2 29 25 28 27   BUN 45* 37* 38* 38*  CREATININE 2.23* 2.49* 2.29* 2.20*  CALCIUM 9.3 8.5* 8.9 8.8*  PROT 7.2  --   --   --   BILITOT 0.9  --   --   --   ALKPHOS 94  --   --   --   ALT 29  --   --   --   AST 34  --   --   --   GLUCOSE 217* 144* 128* 137*   Imaging/Diagnostic Tests: No results found.   Ellwood Dense, DO 07/25/2017, 4:47 PM PGY-1, [facility] FPTS Intern pager: [phone], text pages welcome

## 2017-07-26 LAB — CSF CULTURE W GRAM STAIN: Culture: NO GROWTH

## 2017-07-26 LAB — CSF CULTURE

## 2017-07-26 LAB — BASIC METABOLIC PANEL
ANION GAP: 11 (ref 5–15)
BUN: 45 mg/dL — ABNORMAL HIGH (ref 6–20)
CHLORIDE: 97 mmol/L — AB (ref 101–111)
CO2: 27 mmol/L (ref 22–32)
Calcium: 9.5 mg/dL (ref 8.9–10.3)
Creatinine, Ser: 2.21 mg/dL — ABNORMAL HIGH (ref 0.61–1.24)
GFR calc non Af Amer: 32 mL/min — ABNORMAL LOW (ref >60)
GFR, EST AFRICAN AMERICAN: 37 mL/min — AB (ref >60)
Glucose, Bld: 153 mg/dL — ABNORMAL HIGH (ref 65–99)
Potassium: 4.3 mmol/L (ref 3.5–5.1)
SODIUM: 135 mmol/L (ref 135–145)

## 2017-07-26 MED ORDER — TORSEMIDE 20 MG PO TABS
60.0000 mg | ORAL_TABLET | Freq: Two times a day (BID) | ORAL | Status: AC
  Administered 2017-07-26: 60 mg via ORAL
  Filled 2017-07-26: qty 3

## 2017-07-26 MED ORDER — TORSEMIDE 20 MG PO TABS
60.0000 mg | ORAL_TABLET | Freq: Every day | ORAL | Status: DC
  Administered 2017-07-27: 60 mg via ORAL
  Filled 2017-07-26: qty 3

## 2017-07-26 NOTE — Progress Notes (Signed)
Occupational Therapy Treatment Patient Details Name: Trevor Nunez MRN: [id number] DOB: 27-May-1965 Today's Date: 07/26/2017    History of present illness 52 yo male with onset of low back pain and B thigh pain was admitted for evaluation. Pt had fall on 3/28 and has now elevated troponin and LE edema spec feet.  Previous workup with neurologist shows lumbar spondylosis and foraminal stenosis at L4-5 and L5-S1, as well as L lateral femoral cutaneous n entrapment.  PMHx:  CHF, morbid obesity, lateral femoral cutaneous n entrapment, HTN, CKD.   OT comments  Pt initially agitated that OT in to work with him. Pt agreeable to sit EOB. Educated pt on use of ADL A/E for LB ADLs, pt states that he is aware of what the equipment is but that he doesn't need it to become dependent upon. Explained to  pt that A/E is used to increase his independence for the time being unitl PLOF is restored. OT will continue to follow acutely  Follow Up Recommendations  CIR    Equipment Recommendations  Other (comment)(TBD at next venue of care)    Recommendations for Other Services      Precautions / Restrictions Precautions Precautions: Back;Fall Precaution Comments: pt able to recall 2/3 precautions, reviewed all back precations       Mobility Bed Mobility Overal bed mobility: Needs Assistance Bed Mobility: Rolling;Sidelying to Sit Rolling: Mod assist Sidelying to sit: Mod assist       General bed mobility comments: used rail, increased time and effort  Transfers Overall transfer level: Needs assistance Equipment used: Rolling walker (2 wheeled)             General transfer comment: pt declined standing, stating that he knows he can't since he tried with PT yesterday and wasn't able    Balance Overall balance assessment: Needs assistance Sitting-balance support: No upper extremity supported;Feet supported Sitting balance-Leahy Scale: Good         Standing balance comment: declined standing  with OT                           ADL either performed or assessed with clinical judgement   ADL Overall ADL's : Needs assistance/impaired     Grooming: Set up;Wash/dry face;Wash/dry hands;Sitting   Upper Body Bathing: Supervision/ safety;Set up;Sitting Upper Body Bathing Details (indicate cue type and reason): simulated                           General ADL Comments: educated pt on use of ADL A/E for LB ADLs, pt states that he is aware of what the equipment is but that he doesn't need it to become dependent upon. Explained to  pt that A/E is used to increase his independence for the time being unitl PLOF is restored     Vision Patient Visual Report: No change from baseline     Perception     Praxis      Cognition Arousal/Alertness: Awake/alert Behavior During Therapy: Flat affect(slightly agitated that OT wants him to sit EOB) Overall Cognitive Status: Within Functional Limits for tasks assessed                                          Exercises     Shoulder Instructions       General Comments  Pertinent Vitals/ Pain       Pain Assessment: 0-10 Pain Score: 6  Pain Location: back, LEs Pain Descriptors / Indicators: Grimacing;Sharp;Guarding Pain Intervention(s): Limited activity within patient's tolerance;Monitored during session;Repositioned  Home Living                                          Prior Functioning/Environment              Frequency  Min 2X/week        Progress Toward Goals  OT Goals(current goals can now be found in the care plan section)  Progress towards OT goals: OT to reassess next treatment  Acute Rehab OT Goals Patient Stated Goal: to get his pain controlled and get stronger  Plan Discharge plan remains appropriate    Co-evaluation                 AM-PAC PT "6 Clicks" Daily Activity     Outcome Measure   Help from another person eating meals?: A  Little Help from another person taking care of personal grooming?: A Little Help from another person toileting, which includes using toliet, bedpan, or urinal?: Total Help from another person bathing (including washing, rinsing, drying)?: A Lot Help from another person to put on and taking off regular upper body clothing?: A Little Help from another person to put on and taking off regular lower body clothing?: Total 6 Click Score: 13    End of Session    OT Visit Diagnosis: Pain;Other abnormalities of gait and mobility (R26.89);Muscle weakness (generalized) (M62.81) Pain - Right/Left: Right Pain - part of body: Leg   Activity Tolerance Patient limited by fatigue;Patient limited by pain   Patient Left in bed;with call bell/phone within reach;with bed alarm set   Nurse Communication      Functional Assessment Tool Used: AM-PAC 6 Clicks Daily Activity   Time: 1610-9604 OT Time Calculation (min): 20 min  Charges: OT G-codes **NOT FOR INPATIENT CLASS** Functional Assessment Tool Used: AM-PAC 6 Clicks Daily Activity OT General Charges $OT Visit: 1 Visit OT Treatments $Therapeutic Activity: 8-22 mins     Galen Manila 07/26/2017, 1:58 PM

## 2017-07-26 NOTE — Progress Notes (Addendum)
Family Medicine Teaching Service Daily Progress Note Intern Pager: [phone]  Patient name: Trevor Nunez Medical record number: [id number] Date of birth: Sep 29, 1965 Age: 52 y.o. Gender: male  Primary Care Provider: Barbette Reichmann, MD Consultants: None Code Status: FULL   Pt Overview and Major Events to Date:  4/3 - admitted for bilateral LE weakness/pain   Assessment and Plan: Trevor Nunez is a 52 y.o. male presenting with BL LE weakness/pain. PMH is significant for HTN, CKD, HFpEF, morbid obesity.  Bilateral LE weakness and pain: Pain and weakness persist. S/p extensive workup thus far unrevealing including LP, and immunologic workup (Anti-Jo antibody, Hep C antibody, ANA, Sjogrens, SPEP/IFX). Strength exam appears to be improving day by day. PT/OT recommending CIR. Ortho consulted who drained R knee, synovial fluid with urate crystals and increased WBC which could be related to gout or reactive arthritis per Ortho. Trialed on prednisone burst yesterday, patient doesn't feel much difference in pain, noticing minimal increase in strength. Spoke with neuro this morning, can consider skin biopsy and paraspinal EMG outpatient, with possible paraspinal muscle biopsy, but no further inpatient workup needed at this point, recommend continued PT.   - Neuro following, appreciate recs  - Ortho following, appreciate recs - Nephro following, appreciate recs - continue amitriptyline 25 mg nightly for pain control  - PT/OT- CIR  - d/c allopurinol - trial prednisone burst 50mg  x5d  AoCKD:  Mild improvement this morning with SCr 2.19>>2.49>> 2.21 this am, stable past few days.  Baseline 1.8-2.0. Renal US 4/5 with no acute findings or hydronephrosis. Urea nitrogen 357. Fe UREA 29% indicating prerenal etiology. Continues to diurese well on home torsemide and spironolactone. Nephrology consulted who recommended torsemide BID, followed by daily tomorrow, likely current Cr is new baseline. - monitor on daily  BMP  - nephrology consulted, appreciate recs  HFpEF:  Repeat ECHO EF 65-70%, with moderate diastolic dysfunction. BNP 67.3 on admission. Continues to diurese well on home torsemide with outpt ~2L yesterday. Weight 304>299 and stable. Continues on home spironolactone daily.  Torsemide BID today per nephro recommendation. - continue home spironolactone 25 mg daily.  - torsemide 60mg  BID today, daily tomorrow - strict I's and O's  - daily weights   HTN: Mostly normotensive overnight. BP this AM 159/96.   Home regimen includes Coreg 12.5 mg BID, hydralazine 50 mg TID, Torsemide 60 mg TID.  - torsemide 60mg  BID today  H/o gout Previously on allopurinol. Uric acid elevated at 13.4. R knee synovial fluid with urate crystals, cloudy and yellow appearance with some WBC. Could be gout related. Patient not noticing much improvement in pain. F/u synovial culture (NGx24h). - trial prednisone burst  R arm injury s/p fall: Ortho removed sugar tong cast yesterday, with good ROM and minimal pain today. - f/u with PCP outpatient per ortho  SVT on tele 8 beat VT followed by 6 beat run of SVT identified on tele at ~5PM 4/6.  Patient asymptomatic and no c/o chest pain. Mag wnl. - continue to monitor on tele  OSA: Recently diagnosis of OSA in February, still waiting on a CPAP machine at home  - CPAP at night, refusing   FEN/GI: heart healthy diet Prophylaxis: lovenox  Disposition: CIR  Subjective:  Patient mood is more uplifted today. Feels slightly better after arthrocentesis yesterday. Pain the same. Strength improving.  Objective: Temp:  [98 F (36.7 C)-98.8 F (37.1 C)] 98 F (36.7 C) (04/09 0504) Pulse Rate:  [64-83] 64 (04/09 0504) Resp:  [16-18] 18 (04/09 0504)  BP: (132-159)/(76-96) 159/96 (04/09 0504) SpO2:  [98 %-100 %] 98 % (04/09 0504) Weight:  [299 lb 13.2 oz (136 kg)] 299 lb 13.2 oz (136 kg) (04/09 0504)   Physical Exam: General: lying in bed, in NAD Cardiovascular: RRR, no  MRG  Respiratory: CTAB, no wheezes/rales/rhonchi Abdomen: obese abdomen, nontender to palpation, no rebound or guarding, +BS MSK: ROM limited by pain, strength 5/5 to U/LE bilaterally. R knee edema improved s/p arthrocentesis. Minimal edema noted to bilateral ankles. Neuro: Alert and oriented, speech normal. Optic field normal. Intact symmetric sensation to light touch extremities bilaterally.  Hearing grossly intact bilaterally.   Laboratory: Recent Labs  Lab 07/21/17 0420 07/24/17 0345 07/25/17 0356  WBC 9.5 7.3 7.6  HGB 14.0 12.8* 13.4  HCT 42.9 39.5 41.2  PLT 310 374 405*   Recent Labs  Lab 07/21/17 2009  07/24/17 0345 07/25/17 0356 07/26/17 0750  NA 137   < > 135 135 135  K 2.9*   < > 3.3* 3.8 4.3  CL 93*   < > 93* 95* 97*  CO2 29   < > 28 27 27   BUN 45*   < > 38* 38* 45*  CREATININE 2.23*   < > 2.29* 2.20* 2.21*  CALCIUM 9.3   < > 8.9 8.8* 9.5  PROT 7.2  --   --   --   --   BILITOT 0.9  --   --   --   --   ALKPHOS 94  --   --   --   --   ALT 29  --   --   --   --   AST 34  --   --   --   --   GLUCOSE 217*   < > 128* 137* 153*   < > = values in this interval not displayed.   Imaging/Diagnostic Tests: No results found.   [name], Lecil Tapp, DO 07/26/2017, 9:31 AM PGY-1, [facility] FPTS Intern pager: [phone], text pages welcome

## 2017-07-26 NOTE — Progress Notes (Signed)
Inpatient Rehabilitation  Met with patient at bedside to discuss team's recommendation for IP Rehab.  Shared booklets, insurance verification letter, and answered initial questions.  Plan to follow for timing of medical readiness and IP Rehab bed availability.  Call if questions.    Carmelia Roller., CCC/SLP Admission Coordinator  [facility]  Cell [phone]

## 2017-07-26 NOTE — Progress Notes (Signed)
Patient ID: Trevor Nunez, male   DOB: Mar 10, 1966, 52 y.o.   MRN: [id number] [facility] Progress Note   Assessment/ Plan:    1.  Acute kidney injury on chronic kidney disease stage III:  Previous workup suggestive of hemodynamically mediated acute kidney injury on chronic kidney disease-may indeed have had progression of his underlying kidney disease with what appears to be a new baseline GFR.  Continues to have fair urine output on high doses of torsemide and spironolactone-with his current volume status, would recommend decreasing torsemide to 60 mg twice a day for the next 24 hours and then 60 mg daily thereafter. 2.  Bilateral lower extremity weakness/pain/numbness: Ongoing workup by neurology with adjustment of medications including discontinuation of gabapentin and transitioning to amitriptyline noted. 3.  Hypertension: Blood pressures intermittently elevated-anticipate to improve with spironolactone/diuretic. 4.  Right knee effusion: Joint aspiration done yesterday data suggestive of gout-on oral/intra-articular corticosteroids.  Subjective:   Reports to be feeling fair-still having numbness/pain in both legs as well as the right knee.   Objective:   BP (!) 159/96 (BP Location: Left Arm)   Pulse 64   Temp 98 F (36.7 C)   Resp 18   Ht 6' (1.829 m)   Wt 136 kg (299 lb 13.2 oz)   SpO2 98%   BMI 40.66 kg/m   Intake/Output Summary (Last 24 hours) at 07/26/2017 1050 Last data filed at 07/26/2017 0400 Gross per 24 hour  Intake -  Output 1800 ml  Net -1800 ml   Weight change: 0.1 kg (3.5 oz)  Physical Exam: Gen: Comfortably resting in bed, not in distress CVS: Pulse regular rhythm, normal rate, S1 and S2 normal Resp: Diminished breath sounds over bases-poor inspiratory effort.  No rales/rhonchi Abd: Soft, obese, nontender Ext: Trace lower extremity edema bilaterally  Imaging: No results found.  Labs: BMET Recent Labs  Lab 07/20/17 1800 07/21/17 0420  07/21/17 2009 07/22/17 1426 07/24/17 0345 07/24/17 1230 07/25/17 0356 07/26/17 0750  NA 135 135 137 135 135  --  135 135  K 3.4* 3.5 2.9* 3.8 3.3*  --  3.8 4.3  CL 98* 96* 93* 94* 93*  --  95* 97*  CO2 23 27 29 25 28   --  27 27  GLUCOSE 123* 142* 217* 144* 128*  --  137* 153*  BUN 23* 25* 45* 37* 38*  --  38* 45*  CREATININE 2.19* 2.18* 2.23* 2.49* 2.29*  --  2.20* 2.21*  CALCIUM 8.9 8.6* 9.3 8.5* 8.9  --  8.8* 9.5  PHOS  --   --   --   --   --  3.8  --   --    CBC Recent Labs  Lab 07/20/17 1800 07/21/17 0420 07/24/17 0345 07/25/17 0356  WBC 10.2 9.5 7.3 7.6  NEUTROABS 7.3  --   --   --   HGB 13.8 14.0 12.8* 13.4  HCT 41.4 42.9 39.5 41.2  MCV 89.2 90.5 89.2 89.4  PLT 308 310 374 405*   Medications:    . amitriptyline  25 mg Oral QHS  . aspirin  81 mg Oral Daily  . carvedilol  12.5 mg Oral BID WC  . heparin  5,000 Units Subcutaneous Q8H  . predniSONE  50 mg Oral Q breakfast  . spironolactone  25 mg Oral Daily  . torsemide  60 mg Oral TID   Zetta BillsJay Noor Witte, MD 07/26/2017, 10:50 AM

## 2017-07-26 NOTE — Progress Notes (Signed)
Patient ID: Trevor Nunez, male   DOB: 14-Oct-1965, 52 y.o.   MRN: [id number]   LOS: 6 days   Subjective: Knee about the same   Objective: Vital signs in last 24 hours: Temp:  [98 F (36.7 C)-98.8 F (37.1 C)] 98 F (36.7 C) (04/09 0504) Pulse Rate:  [64-83] 64 (04/09 0504) Resp:  [16-18] 18 (04/09 0504) BP: (132-159)/(76-96) 159/96 (04/09 0504) SpO2:  [98 %-100 %] 98 % (04/09 0504) Weight:  [136 kg (299 lb 13.2 oz)] 136 kg (299 lb 13.2 oz) (04/09 0504) Last BM Date: 07/24/17   Laboratory  CBC Recent Labs    07/24/17 0345 07/25/17 0356  WBC 7.3 7.6  HGB 12.8* 13.4  HCT 39.5 41.2  PLT 374 405*   BMET Recent Labs    07/25/17 0356 07/26/17 0750  NA 135 135  K 3.8 4.3  CL 95* 97*  CO2 27 27  GLUCOSE 137* 153*  BUN 38* 45*  CREATININE 2.20* 2.21*  CALCIUM 8.8* 9.5     Physical Exam General appearance: alert and no distress  Right knee: Continued effusion but improved over yesterday, continued pain with PROM and still limited to 15 degrees or so   Assessment/Plan: Gout -- Would normally recommend starting colchicine + NSAID but pt's kidney disease may preclude. Would continue prednisone and intraarticular steroids should help as well. Should f/u with PCP at discharge for long-term management.    Freeman CaldronMichael J. Mykeal Carrick, PA-C Orthopedic Surgery [phone] 07/26/2017

## 2017-07-27 ENCOUNTER — Encounter (HOSPITAL_COMMUNITY): Payer: Self-pay | Admitting: Nurse Practitioner

## 2017-07-27 ENCOUNTER — Inpatient Hospital Stay (HOSPITAL_COMMUNITY)
Admission: RE | Admit: 2017-07-27 | Discharge: 2017-08-06 | DRG: 092 | Disposition: A | Discharge status: Home / Self Care | Payer: Medicaid Other | Source: Intra-hospital | Attending: Physical Medicine & Rehabilitation | Admitting: Physical Medicine & Rehabilitation

## 2017-07-27 DIAGNOSIS — M1000 Idiopathic gout, unspecified site: Secondary | ICD-10-CM

## 2017-07-27 DIAGNOSIS — G4733 Obstructive sleep apnea (adult) (pediatric): Secondary | ICD-10-CM | POA: Diagnosis present

## 2017-07-27 DIAGNOSIS — N183 Chronic kidney disease, stage 3 unspecified: Secondary | ICD-10-CM

## 2017-07-27 DIAGNOSIS — D72829 Elevated white blood cell count, unspecified: Secondary | ICD-10-CM | POA: Diagnosis not present

## 2017-07-27 DIAGNOSIS — Z9049 Acquired absence of other specified parts of digestive tract: Secondary | ICD-10-CM

## 2017-07-27 DIAGNOSIS — Z885 Allergy status to narcotic agent status: Secondary | ICD-10-CM | POA: Diagnosis not present

## 2017-07-27 DIAGNOSIS — Z6841 Body Mass Index (BMI) 40.0 and over, adult: Secondary | ICD-10-CM

## 2017-07-27 DIAGNOSIS — I77810 Thoracic aortic ectasia: Secondary | ICD-10-CM | POA: Diagnosis present

## 2017-07-27 DIAGNOSIS — N179 Acute kidney failure, unspecified: Secondary | ICD-10-CM | POA: Diagnosis present

## 2017-07-27 DIAGNOSIS — Z87891 Personal history of nicotine dependence: Secondary | ICD-10-CM

## 2017-07-27 DIAGNOSIS — R7303 Prediabetes: Secondary | ICD-10-CM

## 2017-07-27 DIAGNOSIS — I130 Hypertensive heart and chronic kidney disease with heart failure and stage 1 through stage 4 chronic kidney disease, or unspecified chronic kidney disease: Secondary | ICD-10-CM | POA: Diagnosis present

## 2017-07-27 DIAGNOSIS — Z9119 Patient's noncompliance with other medical treatment and regimen: Secondary | ICD-10-CM

## 2017-07-27 DIAGNOSIS — Z9114 Patient's other noncompliance with medication regimen: Secondary | ICD-10-CM | POA: Diagnosis not present

## 2017-07-27 DIAGNOSIS — R2689 Other abnormalities of gait and mobility: Secondary | ICD-10-CM | POA: Diagnosis present

## 2017-07-27 DIAGNOSIS — R740 Nonspecific elevation of levels of transaminase and lactic acid dehydrogenase [LDH]: Secondary | ICD-10-CM | POA: Diagnosis present

## 2017-07-27 DIAGNOSIS — F419 Anxiety disorder, unspecified: Secondary | ICD-10-CM | POA: Diagnosis present

## 2017-07-27 DIAGNOSIS — Z91018 Allergy to other foods: Secondary | ICD-10-CM

## 2017-07-27 DIAGNOSIS — Z79899 Other long term (current) drug therapy: Secondary | ICD-10-CM | POA: Diagnosis not present

## 2017-07-27 DIAGNOSIS — R5381 Other malaise: Secondary | ICD-10-CM | POA: Diagnosis present

## 2017-07-27 DIAGNOSIS — Z7982 Long term (current) use of aspirin: Secondary | ICD-10-CM | POA: Diagnosis not present

## 2017-07-27 DIAGNOSIS — I5032 Chronic diastolic (congestive) heart failure: Secondary | ICD-10-CM

## 2017-07-27 DIAGNOSIS — R29898 Other symptoms and signs involving the musculoskeletal system: Secondary | ICD-10-CM

## 2017-07-27 DIAGNOSIS — Z8249 Family history of ischemic heart disease and other diseases of the circulatory system: Secondary | ICD-10-CM

## 2017-07-27 DIAGNOSIS — M109 Gout, unspecified: Secondary | ICD-10-CM | POA: Diagnosis present

## 2017-07-27 DIAGNOSIS — R269 Unspecified abnormalities of gait and mobility: Secondary | ICD-10-CM | POA: Diagnosis not present

## 2017-07-27 DIAGNOSIS — M792 Neuralgia and neuritis, unspecified: Secondary | ICD-10-CM

## 2017-07-27 DIAGNOSIS — W19XXXD Unspecified fall, subsequent encounter: Secondary | ICD-10-CM | POA: Diagnosis present

## 2017-07-27 DIAGNOSIS — I10 Essential (primary) hypertension: Secondary | ICD-10-CM | POA: Diagnosis not present

## 2017-07-27 DIAGNOSIS — R739 Hyperglycemia, unspecified: Secondary | ICD-10-CM | POA: Diagnosis not present

## 2017-07-27 DIAGNOSIS — M25521 Pain in right elbow: Secondary | ICD-10-CM

## 2017-07-27 DIAGNOSIS — Z91199 Patient's noncompliance with other medical treatment and regimen due to unspecified reason: Secondary | ICD-10-CM

## 2017-07-27 DIAGNOSIS — M10061 Idiopathic gout, right knee: Secondary | ICD-10-CM | POA: Diagnosis not present

## 2017-07-27 DIAGNOSIS — M25529 Pain in unspecified elbow: Secondary | ICD-10-CM

## 2017-07-27 DIAGNOSIS — R7401 Elevation of levels of liver transaminase levels: Secondary | ICD-10-CM

## 2017-07-27 LAB — CBC
HCT: 41.4 % (ref 39.0–52.0)
HEMOGLOBIN: 13.8 g/dL (ref 13.0–17.0)
MCH: 29.9 pg (ref 26.0–34.0)
MCHC: 33.3 g/dL (ref 30.0–36.0)
MCV: 89.6 fL (ref 78.0–100.0)
Platelets: 525 10*3/uL — ABNORMAL HIGH (ref 150–400)
RBC: 4.62 MIL/uL (ref 4.22–5.81)
RDW: 13.1 % (ref 11.5–15.5)
WBC: 16.1 10*3/uL — AB (ref 4.0–10.5)

## 2017-07-27 LAB — RENAL FUNCTION PANEL
ANION GAP: 14 (ref 5–15)
Albumin: 2.9 g/dL — ABNORMAL LOW (ref 3.5–5.0)
BUN: 50 mg/dL — AB (ref 6–20)
CHLORIDE: 95 mmol/L — AB (ref 101–111)
CO2: 24 mmol/L (ref 22–32)
Calcium: 8.8 mg/dL — ABNORMAL LOW (ref 8.9–10.3)
Creatinine, Ser: 2.08 mg/dL — ABNORMAL HIGH (ref 0.61–1.24)
GFR calc Af Amer: 40 mL/min — ABNORMAL LOW (ref >60)
GFR calc non Af Amer: 35 mL/min — ABNORMAL LOW (ref >60)
GLUCOSE: 192 mg/dL — AB (ref 65–99)
Phosphorus: 3.4 mg/dL (ref 2.5–4.6)
Potassium: 3.8 mmol/L (ref 3.5–5.1)
Sodium: 133 mmol/L — ABNORMAL LOW (ref 135–145)

## 2017-07-27 MED ORDER — TRAMADOL HCL 50 MG PO TABS
50.0000 mg | ORAL_TABLET | Freq: Four times a day (QID) | ORAL | Status: DC | PRN
  Administered 2017-07-27 – 2017-08-06 (×24): 50 mg via ORAL
  Filled 2017-07-27 (×25): qty 1

## 2017-07-27 MED ORDER — HEPARIN SODIUM (PORCINE) 5000 UNIT/ML IJ SOLN
5000.0000 [IU] | Freq: Three times a day (TID) | INTRAMUSCULAR | Status: DC
  Administered 2017-07-27 – 2017-08-06 (×29): 5000 [IU] via SUBCUTANEOUS
  Filled 2017-07-27 (×29): qty 1

## 2017-07-27 MED ORDER — AMITRIPTYLINE HCL 25 MG PO TABS
25.0000 mg | ORAL_TABLET | Freq: Every day | ORAL | Status: DC
  Administered 2017-07-27 – 2017-07-31 (×5): 25 mg via ORAL
  Filled 2017-07-27 (×5): qty 1

## 2017-07-27 MED ORDER — HEPARIN SODIUM (PORCINE) 5000 UNIT/ML IJ SOLN: 5000.0000 [IU] | Freq: Three times a day (TID) | INTRAMUSCULAR | Status: DC

## 2017-07-27 MED ORDER — CARVEDILOL 12.5 MG PO TABS
12.5000 mg | ORAL_TABLET | Freq: Two times a day (BID) | ORAL | Status: DC
  Administered 2017-07-27 – 2017-08-06 (×20): 12.5 mg via ORAL
  Filled 2017-07-27 (×20): qty 1

## 2017-07-27 MED ORDER — ONDANSETRON HCL 4 MG PO TABS
4.0000 mg | ORAL_TABLET | Freq: Four times a day (QID) | ORAL | Status: DC | PRN
  Administered 2017-07-29: 4 mg via ORAL
  Filled 2017-07-27: qty 1

## 2017-07-27 MED ORDER — ACETAMINOPHEN 325 MG PO TABS
650.0000 mg | ORAL_TABLET | Freq: Four times a day (QID) | ORAL | Status: DC | PRN
  Administered 2017-07-27 – 2017-08-05 (×7): 650 mg via ORAL
  Filled 2017-07-27 (×9): qty 2

## 2017-07-27 MED ORDER — AMITRIPTYLINE HCL 25 MG PO TABS: 25.0000 mg | ORAL_TABLET | Freq: Every evening | ORAL | 0 refills | Status: DC | PRN

## 2017-07-27 MED ORDER — ACETAMINOPHEN 650 MG RE SUPP: 650.0000 mg | Freq: Four times a day (QID) | RECTAL | Status: DC | PRN

## 2017-07-27 MED ORDER — TORSEMIDE 20 MG PO TABS: 60.0000 mg | ORAL_TABLET | Freq: Every day | ORAL | 0 refills | Status: DC

## 2017-07-27 MED ORDER — CALCIUM CARBONATE ANTACID 500 MG PO CHEW
1.0000 | CHEWABLE_TABLET | Freq: Two times a day (BID) | ORAL | Status: DC | PRN
  Administered 2017-07-31 – 2017-08-05 (×3): 200 mg via ORAL
  Filled 2017-07-27 (×4): qty 1

## 2017-07-27 MED ORDER — TORSEMIDE 20 MG PO TABS
60.0000 mg | ORAL_TABLET | Freq: Every day | ORAL | Status: DC
  Administered 2017-07-28 – 2017-08-06 (×10): 60 mg via ORAL
  Filled 2017-07-27 (×10): qty 3

## 2017-07-27 MED ORDER — SORBITOL 70 % SOLN: 30.0000 mL | Freq: Every day | Status: DC | PRN

## 2017-07-27 MED ORDER — SPIRONOLACTONE 25 MG PO TABS
25.0000 mg | ORAL_TABLET | Freq: Every day | ORAL | Status: DC
  Administered 2017-07-28 – 2017-08-06 (×10): 25 mg via ORAL
  Filled 2017-07-27 (×10): qty 1

## 2017-07-27 MED ORDER — ONDANSETRON HCL 4 MG/2ML IJ SOLN: 4.0000 mg | Freq: Four times a day (QID) | INTRAMUSCULAR | Status: DC | PRN

## 2017-07-27 MED ORDER — ASPIRIN 81 MG PO CHEW
81.0000 mg | CHEWABLE_TABLET | Freq: Every day | ORAL | Status: DC
  Administered 2017-07-28 – 2017-08-06 (×10): 81 mg via ORAL
  Filled 2017-07-27 (×10): qty 1

## 2017-07-27 MED ORDER — PREDNISONE 50 MG PO TABS: 50.0000 mg | ORAL_TABLET | Freq: Every day | ORAL | 0 refills | Status: DC

## 2017-07-27 MED ORDER — PREDNISONE 5 MG PO TABS
50.0000 mg | ORAL_TABLET | Freq: Every day | ORAL | Status: AC
  Administered 2017-07-28 – 2017-07-30 (×3): 50 mg via ORAL
  Filled 2017-07-27 (×4): qty 2

## 2017-07-27 NOTE — Progress Notes (Signed)
Orthopedic Tech Progress Note Patient Details:  Trevor Nunez [birth year] [id number]  Ortho Devices Type of Ortho Device: Ace wrap, Long arm splint Ortho Device/Splint Location: rue Ortho Device/Splint Interventions: Application   Post Interventions Patient Tolerated: Well Instructions Provided: Care of device   Nikki DomCrawford, Jamee Pacholski 07/27/2017, 11:17 AM

## 2017-07-27 NOTE — Progress Notes (Signed)
Inpatient Rehabilitation  I have a bed to offer patient today and have received medical clearance.  Patient reports being eager to get up and walk in order to regain his independence and states that is has not been offered to him yet.  Plan to proceed with IP Rehab admission.  Discussed with team.  Call if questions.   Charlane FerrettiMelissa Brason Berthelot, M.A., CCC/SLP Admission Coordinator  [facility]  Cell [phone]

## 2017-07-27 NOTE — Progress Notes (Signed)
Patient ID: Trevor Nunez, male   DOB: 1966/04/01, 52 y.o.   MRN: [id number] Patient admitted to 4W08 via wheelchair, escorted by nursing staff.  Patient verbalized understanding of rehab process, signed fall safety agreement.  Patient appears to be in no immediate distress at this time.  He does express his frustration with not having a definitive reason for his BLE weakness.  Dani Gobbleeardon, Senetra Dillin J, RN

## 2017-07-27 NOTE — Clinical Social Work Note (Signed)
Patient has a bed at CIR today.  CSW signing off.  Charlynn CourtSarah Omauri Boeve, CSW [phone]

## 2017-07-27 NOTE — Progress Notes (Addendum)
Family Medicine Teaching Service Daily Progress Note Intern Pager: [phone]  Patient name: Trevor Nunez Medical record number: [id number] Date of birth: 07-07-1965 Age: 52 y.o. Gender: male  Primary Care Provider: Barbette Reichmann, MD Consultants: None Code Status: FULL   Pt Overview and Major Events to Date:  4/3 - admitted for bilateral LE weakness/pain   Assessment and Plan: Trevor Nunez is a 52 y.o. male presenting with BL LE weakness/pain. PMH is significant for HTN, CKD, HFpEF, morbid obesity.  Bilateral LE weakness and pain: Pain persists, feels weakness has slightly improved since admission. S/p extensive workup thus far unrevealing including LP, and immunologic workup (Anti-Jo antibody, Hep C antibody, ANA, Sjogrens, SPEP/IFX). Strength exam continues to improve each day. PT/OT recommending CIR. Ortho consulted who drained R knee, synovial fluid consistent with gout, culture NGx24h.  Spoke with neuro yesterday, can consider skin biopsy and paraspinal EMG outpatient, with possible paraspinal muscle biopsy, but no further inpatient workup needed at this point, recommend continued PT.   - Neuro following, appreciate recs  - Ortho following, appreciate recs - Nephro following, appreciate recs - continue amitriptyline 25 mg nightly for pain control  - PT/OT- CIR  - continue prednisone burst 50mg  x5d  H/o gout S/p R knee effusion drainage. Synovial fluid consistent with gout. Uric acid elevated at 13.4 on admission. Synovial culture NGx24h. WBC did increase today to 16.1, likely reactionary to starting prednisone yesterday, no other signs of infection. - continue prednisone burst  AoCKD:  Mild improvement this morning with SCr 2.19>>2.49>> 2.08 this am, stable past few days.  Baseline 1.8-2.0. Renal US 4/5 with no acute findings or hydronephrosis. Urea nitrogen 357. Fe UREA 29% indicating prerenal etiology. Continues to diurese well on home torsemide and spironolactone. Nephrology  consulted who stated current Cr is new baseline. - monitor on daily BMP  - nephrology consulted, appreciate recs - continue torsemide daily per nephro recs  HFpEF:  Repeat ECHO EF 65-70%, with moderate diastolic dysfunction. BNP 67.3 on admission. Continues to diurese well on new dose of torsemide, out ~1.5L yesterday. Weight 304>295. Continues on home spironolactone daily.   - continue home spironolactone 25 mg daily.  - continue daily torsemide 60mg   - strict I's and O's  - daily weights   HTN: Mostly normotensive overnight. BP this AM 134/98.  Home regimen includes Coreg 12.5 mg BID, hydralazine 50 mg TID, Torsemide 60 mg TID.  - torsemide 60mg  daily per nephro  R arm injury s/p fall: Ortho removed sugar tong cast yesterday, with good ROM and minimal pain today. - f/u with PCP outpatient per ortho  SVT on tele 8 beat VT followed by 6 beat run of SVT identified on tele at ~5PM 4/6.  Patient asymptomatic and no c/o chest pain. Mag wnl. - continue to monitor on tele  OSA: Recently diagnosis of OSA in February, still waiting on a CPAP machine at home  - CPAP at night, refusing   FEN/GI: heart healthy diet Prophylaxis: lovenox  Disposition: inpatient diagnostic workup complete, d/c to CIR once bed available  Subjective:  Patient endorses continued frustration at not having clear cut diagnosis but appreciates efforts of medical team and consults. Looking forward to continued regained strength.  Objective: Temp:  [97.6 F (36.4 C)-97.8 F (36.6 C)] 97.7 F (36.5 C) (04/10 0526) Pulse Rate:  [75-86] 75 (04/10 0526) Resp:  [16-20] 16 (04/10 0526) BP: (134-144)/(90-105) 134/98 (04/10 0526) SpO2:  [93 %-96 %] 93 % (04/10 0526) Weight:  [295 lb 13.7 oz (  134.2 kg)] 295 lb 13.7 oz (134.2 kg) (04/10 0500)   Physical Exam: General: lying in bed, in NAD Cardiovascular: RRR, no MRG  Respiratory: CTAB, no wheezes/rales/rhonchi Abdomen: obese abdomen, nontender to palpation, no  rebound or guarding, +BS MSK: ROM increased today but still limited by pain in R leg. strength 5/5 to U/LE bilaterally with great improvement in L leg. R knee edema improved s/p arthrocentesis, some warmth appreciated. No LE edema noted. Neuro: Alert and oriented, speech normal. Optic field normal. Intact symmetric sensation to light touch of extremities bilaterally.  Hearing grossly intact bilaterally.   Laboratory: Recent Labs  Lab 07/24/17 0345 07/25/17 0356 07/27/17 0636  WBC 7.3 7.6 16.1*  HGB 12.8* 13.4 13.8  HCT 39.5 41.2 41.4  PLT 374 405* 525*   Recent Labs  Lab 07/21/17 2009  07/25/17 0356 07/26/17 0750 07/27/17 0636  NA 137   < > 135 135 133*  K 2.9*   < > 3.8 4.3 3.8  CL 93*   < > 95* 97* 95*  CO2 29   < > 27 27 24   BUN 45*   < > 38* 45* 50*  CREATININE 2.23*   < > 2.20* 2.21* 2.08*  CALCIUM 9.3   < > 8.8* 9.5 8.8*  PROT 7.2  --   --   --   --   BILITOT 0.9  --   --   --   --   ALKPHOS 94  --   --   --   --   ALT 29  --   --   --   --   AST 34  --   --   --   --   GLUCOSE 217*   < > 137* 153* 192*   < > = values in this interval not displayed.   Imaging/Diagnostic Tests: No results found.   Trevor Nunez, Trevor Gaffin, DO 07/27/2017, 9:13 AM PGY-1, [facility] FPTS Intern pager: [phone], text pages welcome

## 2017-07-27 NOTE — Progress Notes (Signed)
Patient ID: Tedra Senegalroy Escudero, male   DOB: 1965-10-13, 52 y.o.   MRN: [id number] [facility] Progress Note   Assessment/ Plan:    1.  Acute kidney injury on chronic kidney disease stage III:  Previous workup suggestive of hemodynamically mediated acute kidney injury on chronic kidney disease-may indeed have had progression of his underlying kidney disease with what appears to be a new baseline GFR.  With good UOP and improving volume status on furosemide/spironolactone. Will set up for OP follow up with me on 09/01/17 at 2:30PM with labs 1 week prior to that.  2.  Bilateral lower extremity weakness/pain/numbness: Ongoing workup by neurology with adjustment of medications including discontinuation of gabapentin and transitioning to amitriptyline noted. 3.  Hypertension: Blood pressures intermittently elevated-anticipate to improve with spironolactone/diuretic. 4.  Right knee effusion: Joint aspiration done yesterday data suggestive of gout-on oral/intra-articular corticosteroids.  Will sign off at this time--f/u details in DC section of chart. Call with questions/concerns.   Subjective:   Continues to have pain and numbness of both legs- right knee feels better. Possibly to be DC to CIR for ongoing recovery.    Objective:   BP (!) 134/98 (BP Location: Left Arm)   Pulse 75   Temp 97.7 F (36.5 C)   Resp 16   Ht 6' (1.829 m)   Wt 134.2 kg (295 lb 13.7 oz)   SpO2 93%   BMI 40.13 kg/m   Intake/Output Summary (Last 24 hours) at 07/27/2017 1102 Last data filed at 07/27/2017 0527 Gross per 24 hour  Intake 240 ml  Output 1860 ml  Net -1620 ml   Weight change: -1.8 kg (-3 lb 15.5 oz)  Physical Exam: Gen: Comfortably resting in bed, not in distress CVS: Pulse regular rhythm, normal rate, S1 and S2 normal Resp: Diminished breath sounds over bases-poor inspiratory effort.  No rales/rhonchi Abd: Soft, obese, nontender Ext: Trace lower extremity edema bilaterally  Imaging: No  results found.  Labs: BMET Recent Labs  Lab 07/21/17 0420 07/21/17 2009 07/22/17 1426 07/24/17 0345 07/24/17 1230 07/25/17 0356 07/26/17 0750 07/27/17 0636  NA 135 137 135 135  --  135 135 133*  K 3.5 2.9* 3.8 3.3*  --  3.8 4.3 3.8  CL 96* 93* 94* 93*  --  95* 97* 95*  CO2 27 29 25 28   --  27 27 24   GLUCOSE 142* 217* 144* 128*  --  137* 153* 192*  BUN 25* 45* 37* 38*  --  38* 45* 50*  CREATININE 2.18* 2.23* 2.49* 2.29*  --  2.20* 2.21* 2.08*  CALCIUM 8.6* 9.3 8.5* 8.9  --  8.8* 9.5 8.8*  PHOS  --   --   --   --  3.8  --   --  3.4   CBC Recent Labs  Lab 07/20/17 1800 07/21/17 0420 07/24/17 0345 07/25/17 0356 07/27/17 0636  WBC 10.2 9.5 7.3 7.6 16.1*  NEUTROABS 7.3  --   --   --   --   HGB 13.8 14.0 12.8* 13.4 13.8  HCT 41.4 42.9 39.5 41.2 41.4  MCV 89.2 90.5 89.2 89.4 89.6  PLT 308 310 374 405* 525*   Medications:    . amitriptyline  25 mg Oral QHS  . aspirin  81 mg Oral Daily  . carvedilol  12.5 mg Oral BID WC  . heparin  5,000 Units Subcutaneous Q8H  . predniSONE  50 mg Oral Q breakfast  . spironolactone  25 mg Oral Daily  .  torsemide  60 mg Oral Daily   Zetta Bills, MD 07/27/2017, 11:02 AM

## 2017-07-27 NOTE — H&P (Signed)
Physical Medicine and Rehabilitation Admission H&P    Chief Complaint  Patient presents with  . Congestive Heart Failure  : HPI: Trevor Nunez is a 52 y.o. right-handed male with history of gout maintained on allopurinol, tobacco abuse and quit 23 months ago, morbid obesity, hypertension, CKD stage III, chronic diastolic congestive heart failure, recently diagnosed with OSA in February still awaiting CPAP machine at home, medical noncompliance.  Per chart review and patient, patient lives with a roommate in [place].  Independent up until 2 weeks ago.  He is unemployed.  Roommate does not work.  No local family.  Presented 07/20/2017 with lower extremity weakness times 2 weeks as well as significant pain anterior lateral aspect of left leg.  Noted fall landing on his right arm.  X-rays of right elbow showed joint effusion suspicious for occult fracture and initially placed with sugar tong cast removed 07/25/2017 plan follow-up outpatient orthopedic services Dr. Edmonia Lynch. MRI of the brain reviewed, unremarkable for acute intracranial process. Lumbar puncture with protein 43, glucose 74.  HIV screen nonreactive.  Sedimentation rate 65.  Uric acid level elevated 13.4. MRI lumbar, cervical and thoracic spine showed lumbar spondylosis predominantly at L4-5 and L5-S1 levels.  No significant canal stenosis and without fracture.  There was some neural foraminal narrowing at C3-4 through C6-7 moderate to severe on the right at C4-5.  X-rays of the right knee showed a large joint effusion with synovitis.  Small radial tear of the medial meniscus body.  High-grade cartilage loss in the medial and patellofemoral compartments.   Echocardiogram with ejection fraction of 48% grade 2 diastolic dysfunction.  Hep C antibody, ANA are pending.  Neurology recommends paraspinal EMG to be completed as outpatient with possible paraspinal muscle biopsy but no further workup indicated for inpatient at this time.  Noted creatinine 2.19, troponin 0 0.09.  Renal ultrasound negative.  Follow-up renal services felt acute kidney injury complicated by chronic kidney disease and furosemide was decreased to 60 mg daily as well as advised him to continue Aldactone. Underwent right knee aspiration of joint effusion 07/25/2017 showing urate crystals with increased WBCs and placed on prednisone times 5 days for gout.   Subcutaneous heparin for DVT prophylaxis.  Venous Doppler studies lower extremities negative.  Therapy evaluations completed and ongoing with recommendations of physical medicine rehab consult .  Patient was admitted for a comprehensive rehab program   Review of Systems  Constitutional: Negative for chills and fever.  HENT: Negative for hearing loss.   Eyes: Negative for blurred vision, double vision and redness.  Respiratory: Negative for shortness of breath.   Cardiovascular: Positive for leg swelling. Negative for chest pain.  Gastrointestinal: Positive for constipation. Negative for nausea and vomiting.  Genitourinary:       No bowel or bladder incontinence  Musculoskeletal: Positive for joint pain and myalgias.  Skin: Negative for rash.  Neurological: Positive for weakness. Negative for seizures.  All other systems reviewed and are negative.  Past Medical History:  Diagnosis Date  . Chest pain    a. In setting of HTN Urgency w/ trop elevation-->05/2016 MV: no ischemia-->low risk.  . Chronic diastolic CHF (congestive heart failure) ([place])    a. 10/2015 Echo: EF 55-60%, no rwma; b. 05/2016 Echo: EF 65-70%, sev LVH, Gr2 DD, triv AI, mildly dil LA, nl RV size/fxn, mildly-mod dil RA.  . CKD (chronic kidney disease), stage III ([place])   . Dilated aortic root ([place])    a. 10/2015  Echo: Ao root 73m;  b. 05/2016 Echo: triv AI.  .Marland KitchenElevated troponin    a. 10/2015, 05/2016, & 10/2016 in the setting of HTN Urgency;  b. 05/2016 MV: no ischemia-->Low risk.  . H/O medication noncompliance   . Hypertension    Past  Surgical History:  Procedure Laterality Date  . CHOLECYSTECTOMY    . HERNIA REPAIR     Family History  Problem Relation Age of Onset  . Hypertension Mother   . Heart attack Mother   . CVA Mother   . Heart disease Sister    Social History:  reports that he quit smoking about 23 months ago. He has never used smokeless tobacco. He reports that he does not drink alcohol or use drugs. Allergies:  Allergies  Allergen Reactions  . Codeine Palpitations  . Strawberry (Diagnostic) Rash and Other (See Comments)    "Tongue swells/feels heavy"   Medications Prior to Admission  Medication Sig Dispense Refill  . aspirin 81 MG chewable tablet Chew 1 tablet (81 mg total) by mouth daily. 90 tablet 3  . carvedilol (COREG) 12.5 MG tablet Take 12.5 mg by mouth 2 (two) times daily with a meal.    . hydrALAZINE (APRESOLINE) 50 MG tablet Take 50 mg by mouth 3 (three) times daily.    . potassium chloride SA (K-DUR,KLOR-CON) 20 MEQ tablet Take 1 tablet (20 mEq total) by mouth 2 (two) times daily. And take extra when taking metolazone 180 tablet 3  . spironolactone (ALDACTONE) 25 MG tablet Take 25 mg by mouth daily.    .Marland Kitchentorsemide (DEMADEX) 20 MG tablet Take 60 mg by mouth 3 (three) times daily.     .Marland Kitchenallopurinol (ZYLOPRIM) 100 MG tablet Take 1 tablet (100 mg total) by mouth daily. (Patient not taking: Reported on 07/20/2017) 30 tablet 0  . cyclobenzaprine (FLEXERIL) 5 MG tablet Take 1-2 tablets 3 times daily as needed (Patient not taking: Reported on 07/20/2017) 20 tablet 0  . lidocaine (LIDODERM) 5 % Place 1 patch onto the skin every 12 (twelve) hours. Remove & Discard patch within 12 hours or as directed by MD (Patient not taking: Reported on 07/20/2017) 10 patch 0  . metolazone (ZAROXOLYN) 2.5 MG tablet Take 1 tablet (2.5 mg total) by mouth daily. (Patient not taking: Reported on 07/20/2017) 3 tablet 0  . nitroGLYCERIN (NITROSTAT) 0.4 MG SL tablet Place 1 tablet (0.4 mg total) under the tongue every 5 (five)  minutes as needed for chest pain. 20 tablet 1  . oxyCODONE-acetaminophen (PERCOCET) 5-325 MG tablet Take 1 tablet by mouth every 4 (four) hours as needed for severe pain. 6 tablet 0    Drug Regimen Review Drug regimen was reviewed and remains appropriate with no significant issues identified  Home: Home Living Family/patient expects to be discharged to:: Private residence Living Arrangements: Other relatives("roommate") Available Help at Discharge: Family, Friend(s) Type of Home: Apartment Home Access: Level entry Home Layout: One level BBiochemist, clinical Standard Home Equipment: None   Functional History: Prior Function Level of Independence: Independent Comments: pt was living at home with other family and has been walking with no AD  Functional Status:  Mobility: Bed Mobility Overal bed mobility: Needs Assistance Bed Mobility: Rolling, Sidelying to Sit Rolling: Mod assist Sidelying to sit: Mod assist General bed mobility comments: used rail, increased time and effort Transfers Overall transfer level: Needs assistance Equipment used: Rolling walker (2 wheeled) Transfers: Sit to/from Stand, Stand Pivot Transfers Sit to Stand: +2 safety/equipment, Mod assist Stand pivot  transfers: Min assist, +2 safety/equipment General transfer comment: pt declined standing, stating that he knows he can't since he tried with PT yesterday and wasn't able Ambulation/Gait General Gait Details: unable due to pain    ADL: ADL Overall ADL's : Needs assistance/impaired Eating/Feeding: Set up, Bed level Eating/Feeding Details (indicate cue type and reason): setup assist to open drink containers Grooming: Set up, Wash/dry face, Wash/dry hands, Sitting Upper Body Bathing: Supervision/ safety, Set up, Sitting Upper Body Bathing Details (indicate cue type and reason): simulated General ADL Comments: educated pt on use of ADL A/E for LB ADLs, pt states that he is aware of what the equipment is but  that he doesn't need it to become dependent upon. Explained to  pt that A/E is used to increase his independence for the time being unitl PLOF is restored  Cognition: Cognition Overall Cognitive Status: Within Functional Limits for tasks assessed Orientation Level: Oriented X4 Cognition Arousal/Alertness: Awake/alert Behavior During Therapy: Flat affect(slightly agitated that OT wants him to sit EOB) Overall Cognitive Status: Within Functional Limits for tasks assessed  Physical Exam: Blood pressure (!) 144/105, pulse 79, temperature 97.8 F (36.6 C), temperature source Oral, resp. rate 20, height 6' (1.829 m), weight 136 kg (299 lb 13.2 oz), SpO2 93 %. Physical Exam  Vitals reviewed. Constitutional: He appears well-developed.  HENT:  Head: Normocephalic and atraumatic.  Eyes: EOM are normal. Right eye exhibits no discharge. Left eye exhibits no discharge.  Neck: Normal range of motion. Neck supple. No tracheal deviation present. No thyromegaly present.  Cardiovascular: Normal rate, regular rhythm and normal heart sounds.  Respiratory: Effort normal and breath sounds normal. No respiratory distress.  GI: Soft. Bowel sounds are normal. He exhibits no distension.  Musculoskeletal:  RUE edema and TTP  Neurological: He is alert.  Motor: LUE: 5/5 proximal to distal LLE: HF 4/5, KE 5/5, ADF 5/5 RLE: HF 4/5, KE 4/5, ADF 5/5 RUE: shoulder abduction 4/5, elbow dressed, hand grip 4+/5  Skin:  RUE with dressing c/d/i  Psychiatric: He has a normal mood and affect. His behavior is normal.    Results for orders placed or performed during the hospital encounter of 07/20/17 (from the past 48 hour(s))  CBC     Status: Abnormal   Collection Time: 07/25/17  3:56 AM  Result Value Ref Range   WBC 7.6 4.0 - 10.5 K/uL   RBC 4.61 4.22 - 5.81 MIL/uL   Hemoglobin 13.4 13.0 - 17.0 g/dL   HCT 41.2 39.0 - 52.0 %   MCV 89.4 78.0 - 100.0 fL   MCH 29.1 26.0 - 34.0 pg   MCHC 32.5 30.0 - 36.0 g/dL   RDW  12.8 11.5 - 15.5 %   Platelets 405 (H) 150 - 400 K/uL    Comment: Performed at [facility] [address], [place], [place] [address]  Basic metabolic panel     Status: Abnormal   Collection Time: 07/25/17  3:56 AM  Result Value Ref Range   Sodium 135 135 - 145 mmol/L   Potassium 3.8 3.5 - 5.1 mmol/L   Chloride 95 (L) 101 - 111 mmol/L   CO2 27 22 - 32 mmol/L   Glucose, Bld 137 (H) 65 - 99 mg/dL   BUN 38 (H) 6 - 20 mg/dL   Creatinine, Ser 2.20 (H) 0.61 - 1.24 mg/dL   Calcium 8.8 (L) 8.9 - 10.3 mg/dL   GFR calc non Af Amer 32 (L) >60 mL/min   GFR calc Af  Amer 38 (L) >60 mL/min    Comment: (NOTE) The eGFR has been calculated using the CKD EPI equation. This calculation has not been validated in all clinical situations. eGFR's persistently <60 mL/min signify possible Chronic Kidney Disease.    Anion gap 13 5 - 15    Comment: Performed at [facility] [address], [place], [place] [address]  Body fluid culture     Status: None (Preliminary result)   Collection Time: 07/25/17  2:53 PM  Result Value Ref Range   Specimen Description SYNOVIAL RIGHT KNEE    Special Requests NONE    Gram Stain      ABUNDANT WBC PRESENT, PREDOMINANTLY MONONUCLEAR NO ORGANISMS SEEN    Culture      NO GROWTH < 24 HOURS Performed at [facility], [place] [address] [place], [place] [address]    Report Status PENDING   Synovial cell count + diff, w/ crystals     Status: Abnormal   Collection Time: 07/25/17  4:04 PM  Result Value Ref Range   Color, Synovial YELLOW YELLOW   Appearance-Synovial CLOUDY (A) CLEAR   Crystals, Fluid INTRACELLULAR MONOSODIUM URATE CRYSTALS    WBC, Synovial 935 (H) 0 - 200 /cu mm   Neutrophil, Synovial 13 0 - 25 %   Lymphocytes-Synovial Fld 19 0 - 20 %   Monocyte-Macrophage-Synovial Fluid 68 50 - 90 %    Comment: Performed at [facility], [place] [address] [place], [place] [address]  Basic metabolic panel     Status: Abnormal   Collection Time:  07/26/17  7:50 AM  Result Value Ref Range   Sodium 135 135 - 145 mmol/L   Potassium 4.3 3.5 - 5.1 mmol/L   Chloride 97 (L) 101 - 111 mmol/L   CO2 27 22 - 32 mmol/L   Glucose, Bld 153 (H) 65 - 99 mg/dL   BUN 45 (H) 6 - 20 mg/dL   Creatinine, Ser 2.21 (H) 0.61 - 1.24 mg/dL   Calcium 9.5 8.9 - 10.3 mg/dL   GFR calc non Af Amer 32 (L) >60 mL/min   GFR calc Af Amer 37 (L) >60 mL/min    Comment: (NOTE) The eGFR has been calculated using the CKD EPI equation. This calculation has not been validated in all clinical situations. eGFR's persistently <60 mL/min signify possible Chronic Kidney Disease.    Anion gap 11 5 - 15    Comment: Performed at [facility] [address], [place], [place] [address]   No results found.   Medical Problem List and Plan: 1.  Decreased functional mobility secondary to bilateral lower extremity weakness, edema and decreased sensation.  Plan follow-up outpatient with neurology services for paraspinal EMG and possible paraspinal muscle biopsy 2.  DVT Prophylaxis/Anticoagulation: Subcutaneous heparin.  Venous Doppler studies negative 3. Pain Management: Elavil 25 mg nightly 4. Mood: Provide emotional support 5. Neuropsych: This patient is capable of making decisions on his own behalf. 6. Skin/Wound Care: Routine skin checks 7. Fluids/Electrolytes/Nutrition: Routine I/O's with follow-up chemistries 8.  Acute kidney injury on chronic kidney disease stage III.  Continue Demadex 60 mg daily and Aldactone 25 mg daily.  Follow-up renal services 9.  Gout.  Uric acid level 13.4.  Right knee joint aspiration 07/25/2017.  Continue prednisone as directed 07/26/2017 x 5 days 10.  Hypertension.  Coreg 12.5 mg twice daily 11.  Right arm injury after fall.  Conservative care per orthopedic services and fitted with a posterior  elbow splint.  Weightbearing as tolerated 12.  OSA.  Recently diagnosed in February.  Still awaiting CPAP machine at home.  Patient has been  refusing. 13.  Diastolic congestive heart failure.  Monitor for any signs of fluid overload 14.  Medical noncompliance.  Provide counseling  Post Admission Physician Evaluation: 1. Preadmission assessment reviewed and changes made below. 2. Functional deficits secondary  to debility. 3. Patient is admitted to receive collaborative, interdisciplinary care between the physiatrist, rehab nursing staff, and therapy team. 4. Patient's level of medical complexity and substantial therapy needs in context of that medical necessity cannot be provided at a lesser intensity of care such as a SNF. 5. Patient has experienced substantial functional loss from his/her baseline which was documented above under the "Functional History" and "Functional Status" headings.  Judging by the patient's diagnosis, physical exam, and functional history, the patient has potential for functional progress which will result in measurable gains while on inpatient rehab.  These gains will be of substantial and practical use upon discharge  in facilitating mobility and self-care at the household level. 81. Physiatrist will provide 24 hour management of medical needs as well as oversight of the therapy plan/treatment and provide guidance as appropriate regarding the interaction of the two. 7. 24 hour rehab nursing will assist with safety, skin/wound care, disease management, pain management and patient education  and help integrate therapy concepts, techniques,education, etc. 8. PT will assess and treat for/with: Lower extremity strength, range of motion, stamina, balance, functional mobility, safety, adaptive techniques and equipment, woundcare, coping skills, pain control, education.   Goals are: Supervision. 9. OT will assess and treat for/with: ADL's, functional mobility, safety, upper extremity strength, adaptive techniques and equipment, wound mgt, ego support, and community reintegration.   Goals are: Supervision/Min A. Therapy may  not proceed with showering this patient. 10. Case Management and Social Worker will assess and treat for psychological issues and discharge planning. 11. Team conference will be held weekly to assess progress toward goals and to determine barriers to discharge. 12. Patient will receive at least 3 hours of therapy per day at least 5 days per week. 13. ELOS: 9-14 days.       14. Prognosis:  good  Delice Lesch, MD, ABPMR Lavon Paganini Angiulli, PA-C 07/26/2017

## 2017-07-27 NOTE — PMR Pre-admission (Signed)
PMR Admission Coordinator Pre-Admission Assessment  Patient: Trevor Nunez is an 52 y.o., male MRN: [id number] DOB: [birth year] Height: 6' (182.9 cm) Weight: 134.2 kg (295 lb 13.7 oz)              Insurance Information HMO:     PPO:      PCP:      IPA:      80/20:      OTHER:  PRIMARY: Medicaid [place]      Policy#: [id number]      Subscriber: Self CM Name:       Phone#:      Fax#:  Pre-Cert#: Coverage code: [id number]      Employer: Disabled  Benefits:  Phone #: [phone]     Name: Verified via automated system Eff. Date: Eligible as of 07/26/17     Deduct: N/A      Out of Pocket Max: N/A      Life Max: N/A CIR: Covered per Medicaid guidelines         Emergency Contact Information Contact Information    Name Relation Home Work Mobile   Trevor Nunez,Clarence Brother [phone]     Blain Pais   [phone]     Current Medical History  Patient Admitting Diagnosis: 52 yo morbidly obese male with Mobility and functional deficits secondary to ?septic arthritis right knee, unclear etiology at this point for lower extremity sensory loss/weakness  History of Present Illness: Trevor Brooksis a 52 y.o.right-handed malewith history of gout maintained on allopurinol,tobacco abuse and quit 23 months ago,morbid obesity, hypertension, CKD stage III, chronic diastolic congestive heart failure, recently diagnosed with OSA in February still awaiting CPAP machine at home, medical noncompliance.Per chart review patient lives with a roommate in [place], [place]. Independent up until 2 weeks ago. He is unemployed. Roommate does not work. No local family.Presented 07/20/2017 with lower extremity weakness times 2 weeks as well as significant pain anterior lateral aspect of left leg. Noted fall landing on his right arm.  X-rays of right elbow showed joint effusion suspicious for occult fracture and initially placed with sugar tong cast removed 07/25/2017 plan follow-up outpatient orthopedic  services Dr. Margarita Rana.MRI of the brain negative.  Lumbar puncture with protein 43, glucose 74.  HIV screen nonreactive.  Sedimentation rate 65.  Uric acid level elevated 13.4.MRI lumbar, cervical and thoracic spine showed lumbar spondylosis predominantly at L4-5 and L5-S1 levels. No significant canal stenosis and without fracture. There was some neural foraminal narrowing at C3-4 through C6-7 moderate to severe on the right at C4-5. X-rays of the right knee showed a large joint effusion with synovitis. Small radial tear of the medial meniscus body. High-grade cartilage loss in the medial and patellofemoral compartments.  Echocardiogram with ejection fraction of 70% grade 2 diastolic dysfunction.  Hep C antibody, ANA are pending.  Neurology recommends paraspinal EMG to be completed as outpatient with possible paraspinal muscle biopsy but no further workup indicated for inpatient at this time. Noted creatinine 2.19, troponin 0 0.09. Renal ultrasound negative.  Follow-up renal services felt acute kidney injury complicated by chronic kidney disease and furosemide was decreased to 60 mg daily as well as advised him to continue Aldactone.Underwent right knee aspiration of joint effusion 07/25/2017 showing urate crystals with increased WBCs and placed on prednisone times 5 days for gout.  Subcutaneous heparin for DVT prophylaxis. Venous Doppler studies lower extremities negative. Therapy evaluations completed and ongoing with recommendations of physical medicine rehab consult .  Patient was admitted for a  comprehensive rehab program 07/27/17.       Past Medical History  Past Medical History:  Diagnosis Date  . Chest pain    a. In setting of HTN Urgency w/ trop elevation-->05/2016 MV: no ischemia-->low risk.  . Chronic diastolic CHF (congestive heart failure) (HCC)    a. 10/2015 Echo: EF 55-60%, no rwma; b. 05/2016 Echo: EF 65-70%, sev LVH, Gr2 DD, triv AI, mildly dil LA, nl RV size/fxn, mildly-mod  dil RA.  . CKD (chronic kidney disease), stage III (HCC)   . Dilated aortic root (HCC)    a. 10/2015 Echo: Ao root 41mm;  b. 05/2016 Echo: triv AI.  Marland Kitchen Elevated troponin    a. 10/2015, 05/2016, & 10/2016 in the setting of HTN Urgency;  b. 05/2016 MV: no ischemia-->Low risk.  . H/O medication noncompliance   . Hypertension     Family History  family history includes CVA in his mother; Heart attack in his mother; Heart disease in his sister; Hypertension in his mother.  Prior Rehab/Hospitalizations:  Has the patient had major surgery during 100 days prior to admission? No  Current Medications   Current Facility-Administered Medications:  .  acetaminophen (TYLENOL) tablet 650 mg, 650 mg, Oral, Q6H PRN, 650 mg at 07/27/17 0548 **OR** acetaminophen (TYLENOL) suppository 650 mg, 650 mg, Rectal, Q6H PRN, Mikell, Antionette Poles, MD .  amitriptyline (ELAVIL) tablet 25 mg, 25 mg, Oral, QHS, Freddrick March, MD, 25 mg at 07/26/17 2102 .  aspirin chewable tablet 81 mg, 81 mg, Oral, Daily, Mikell, Antionette Poles, MD, 81 mg at 07/27/17 0958 .  carvedilol (COREG) tablet 12.5 mg, 12.5 mg, Oral, BID WC, Mikell, Antionette Poles, MD, 12.5 mg at 07/27/17 0958 .  gi cocktail (Maalox,Lidocaine,Donnatal), 30 mL, Oral, BID PRN, Howard Pouch, MD, 30 mL at 07/24/17 2111 .  heparin injection 5,000 Units, 5,000 Units, Subcutaneous, Q8H, Howard Pouch, MD, 5,000 Units at 07/27/17 0548 .  predniSONE (DELTASONE) tablet 50 mg, 50 mg, Oral, Q breakfast, Freddrick March, MD, 50 mg at 07/27/17 0958 .  spironolactone (ALDACTONE) tablet 25 mg, 25 mg, Oral, Daily, Mikell, Antionette Poles, MD, 25 mg at 07/27/17 0958 .  torsemide (DEMADEX) tablet 60 mg, 60 mg, Oral, Daily, Ellwood Dense, DO, 60 mg at 07/27/17 1610  Patients Current Diet: Diet Heart Room service appropriate? Yes; Fluid consistency: Thin  Precautions / Restrictions Precautions Precautions: Back, Fall Precaution Booklet Issued: No Precaution Comments: pt able to recall 2/3  precautions, reviewed all back precations Restrictions Weight Bearing Restrictions: No   Has the patient had 2 or more falls or a fall with injury in the past year?Yes, 1 fall with injury of fracture to elbow  Prior Activity Level Community (5-7x/wk): Prior to admission patient was fully independent at home and in the community.   Home Assistive Devices / Equipment Home Assistive Devices/Equipment: None Home Equipment: None  Prior Device Use: Indicate devices/aids used by the patient prior to current illness, exacerbation or injury? None of the above  Prior Functional Level Prior Function Level of Independence: Independent Comments: pt was living at home with other family and has been walking with no AD  Self Care: Did the patient need help bathing, dressing, using the toilet or eating? Independent  Indoor Mobility: Did the patient need assistance with walking from room to room (with or without device)? Independent  Stairs: Did the patient need assistance with internal or external stairs (with or without device)? Independent  Functional Cognition: Did the patient need help planning regular tasks such  as shopping or remembering to take medications? Independent  Current Functional Level Cognition  Overall Cognitive Status: Within Functional Limits for tasks assessed Orientation Level: Oriented X4    Extremity Assessment (includes Sensation/Coordination)  Upper Extremity Assessment: RUE deficits/detail RUE Deficits / Details: RUE splinted/ace wrapped, pt demonstrates AROM approx 0-90*, cannot move beyond this range due to pain, able to move digits within available range that bandage permits  RUE: Unable to fully assess due to pain, Unable to fully assess due to immobilization  Lower Extremity Assessment: Defer to PT evaluation RLE Deficits / Details: R leg is limited by pain to move but can generate mm contraction RLE: Unable to fully assess due to pain    ADLs  Overall ADL's  : Needs assistance/impaired Eating/Feeding: Set up, Bed level Eating/Feeding Details (indicate cue type and reason): setup assist to open drink containers Grooming: Set up, Wash/dry face, Wash/dry hands, Sitting Upper Body Bathing: Supervision/ safety, Set up, Sitting Upper Body Bathing Details (indicate cue type and reason): simulated General ADL Comments: educated pt on use of ADL A/E for LB ADLs, pt states that he is aware of what the equipment is but that he doesn't need it to become dependent upon. Explained to  pt that A/E is used to increase his independence for the time being unitl PLOF is restored    Mobility  Overal bed mobility: Needs Assistance Bed Mobility: Rolling, Sidelying to Sit Rolling: Mod assist Sidelying to sit: Mod assist General bed mobility comments: used rail, increased time and effort    Transfers  Overall transfer level: Needs assistance Equipment used: Rolling walker (2 wheeled) Transfers: Sit to/from Stand, Stand Pivot Transfers Sit to Stand: +2 safety/equipment, Mod assist Stand pivot transfers: Min assist, +2 safety/equipment General transfer comment: pt declined standing, stating that he knows he can't since he tried with PT yesterday and wasn't able    Ambulation / Gait / Stairs / Wheelchair Mobility  Ambulation/Gait General Gait Details: unable due to pain    Posture / Balance Balance Overall balance assessment: Needs assistance Sitting-balance support: No upper extremity supported, Feet supported Sitting balance-Leahy Scale: Good Standing balance support: Bilateral upper extremity supported, During functional activity Standing balance-Leahy Scale: Poor Standing balance comment: declined standing with OT    Special needs/care consideration BiPAP/CPAP: Not PTA, nut having a CPAP sent to house now CPM: No Continuous Drip IV: No Dialysis: No         Life Vest: No Oxygen: No Special Bed: No Trach Size: No Wound Vac (area): No       Skin: WDL  per chart review                               Bowel mgmt: Continent, last BM 07/24/17 Bladder mgmt: Continent with urinal  Diabetic mgmt: No     Previous Home Environment Living Arrangements: Other relatives("roommate") Available Help at Discharge: Family, Friend(s) Type of Home: Apartment Home Layout: One level Home Access: Level entry Firefighter: Standard Home Care Services: No  Discharge Living Setting Plans for Discharge Living Setting: Patient's home, Lives with (comment)(roommate ) Type of Home at Discharge: House Discharge Home Layout: One level Discharge Home Access: Level entry Discharge Bathroom Shower/Tub: Walk-in shower Discharge Bathroom Toilet: Standard Discharge Bathroom Accessibility: Yes How Accessible: Accessible via walker Does the patient have any problems obtaining your medications?: No  Social/Family/Support Systems Patient Roles: Other (Comment)(Brother and friend ) Anticipated Caregiver: N/A Mod I  goals, but roommate can assist as needed Discharge Plan Discussed with Primary Caregiver: (Discussed with patient ) Is Caregiver In Agreement with Plan?: (Patient is in agreement with plan ) Does Caregiver/Family have Issues with Lodging/Transportation while Pt is in Rehab?: No  Goals/Additional Needs Patient/Family Goal for Rehab: PT/OT: Mod I  Expected length of stay: 8-13 days  Cultural Considerations: None Dietary Needs: Heart Healthy diet restrictions  Equipment Needs: TBD Pt/Family Agrees to Admission and willing to participate: Yes Program Orientation Provided & Reviewed with Pt/Caregiver Including Roles  & Responsibilities: Yes   Decrease burden of Care through IP rehab admission: No  Possible need for SNF placement upon discharge: No  Patient Condition: This patient's condition remains as documented in the consult dated 07/25/17 at 3:40 pm, in which the Rehabilitation Physician determined and documented that the patient's condition is  appropriate for intensive rehabilitative care in an inpatient rehabilitation facility. Will admit to inpatient rehab today.  Preadmission Screen Completed By:  Fae PippinMelissa Ave Scharnhorst, 07/27/2017 11:44 AM ______________________________________________________________________   Discussed status with Dr. Allena KatzPatel on 07/27/17 at 1200 and received telephone approval for admission today.  Admission Coordinator:  Fae PippinMelissa Emmalynn Pinkham, time 1200/Date 07/27/17

## 2017-07-27 NOTE — Care Management Note (Signed)
Case Management Note  Patient Details  Name: Trevor Nunez MRN: [id number] Date of Birth: 01-20-1966  Subjective/Objective:     Bilateral le weakness.          Action/Plan: Transition to CIR.   Expected Discharge Date:  07/27/17               Expected Discharge Plan:  IP Rehab Facility  In-House Referral:     Discharge planning Services  CM Consult  Post Acute Care Choice:    Choice offered to:     Status of Service:  Completed, signed off  If discussed at Long Length of Stay Meetings, dates discussed:    Additional Comments:  Epifanio LeschesCole, Aidynn Polendo Hudson, RN 07/27/2017, 2:33 PM

## 2017-07-27 NOTE — Progress Notes (Signed)
On call provider called & gave verbal order for Ultram & was alerted of allergy cooment.

## 2017-07-28 ENCOUNTER — Inpatient Hospital Stay (HOSPITAL_COMMUNITY): Payer: Medicaid Other | Admitting: Occupational Therapy

## 2017-07-28 ENCOUNTER — Inpatient Hospital Stay (HOSPITAL_COMMUNITY): Payer: Medicaid Other

## 2017-07-28 ENCOUNTER — Inpatient Hospital Stay (HOSPITAL_COMMUNITY): Payer: Medicaid Other | Admitting: Physical Therapy

## 2017-07-28 DIAGNOSIS — M10061 Idiopathic gout, right knee: Secondary | ICD-10-CM

## 2017-07-28 DIAGNOSIS — R739 Hyperglycemia, unspecified: Secondary | ICD-10-CM

## 2017-07-28 DIAGNOSIS — R269 Unspecified abnormalities of gait and mobility: Secondary | ICD-10-CM

## 2017-07-28 LAB — CBC WITH DIFFERENTIAL/PLATELET
Basophils Absolute: 0 10*3/uL (ref 0.0–0.1)
Basophils Relative: 0 %
EOS ABS: 0 10*3/uL (ref 0.0–0.7)
EOS PCT: 0 %
HCT: 40.5 % (ref 39.0–52.0)
HEMOGLOBIN: 13.1 g/dL (ref 13.0–17.0)
Lymphocytes Relative: 17 %
Lymphs Abs: 2.8 10*3/uL (ref 0.7–4.0)
MCH: 29.4 pg (ref 26.0–34.0)
MCHC: 32.3 g/dL (ref 30.0–36.0)
MCV: 90.8 fL (ref 78.0–100.0)
MONO ABS: 0.6 10*3/uL (ref 0.1–1.0)
MONOS PCT: 4 %
NEUTROS PCT: 79 %
Neutro Abs: 12.8 10*3/uL — ABNORMAL HIGH (ref 1.7–7.7)
PLATELETS: 532 10*3/uL — AB (ref 150–400)
RBC: 4.46 MIL/uL (ref 4.22–5.81)
RDW: 13.3 % (ref 11.5–15.5)
WBC: 16.2 10*3/uL — ABNORMAL HIGH (ref 4.0–10.5)

## 2017-07-28 LAB — COMPREHENSIVE METABOLIC PANEL
ALBUMIN: 3 g/dL — AB (ref 3.5–5.0)
ALK PHOS: 78 U/L (ref 38–126)
ALT: 79 U/L — ABNORMAL HIGH (ref 17–63)
ANION GAP: 13 (ref 5–15)
AST: 44 U/L — ABNORMAL HIGH (ref 15–41)
BUN: 50 mg/dL — ABNORMAL HIGH (ref 6–20)
CALCIUM: 8.9 mg/dL (ref 8.9–10.3)
CO2: 24 mmol/L (ref 22–32)
Chloride: 100 mmol/L — ABNORMAL LOW (ref 101–111)
Creatinine, Ser: 2.07 mg/dL — ABNORMAL HIGH (ref 0.61–1.24)
GFR calc non Af Amer: 35 mL/min — ABNORMAL LOW (ref >60)
GFR, EST AFRICAN AMERICAN: 40 mL/min — AB (ref >60)
GLUCOSE: 124 mg/dL — AB (ref 65–99)
POTASSIUM: 3.7 mmol/L (ref 3.5–5.1)
SODIUM: 137 mmol/L (ref 135–145)
TOTAL PROTEIN: 7.3 g/dL (ref 6.5–8.1)
Total Bilirubin: 0.5 mg/dL (ref 0.3–1.2)

## 2017-07-28 LAB — BODY FLUID CULTURE: CULTURE: NO GROWTH

## 2017-07-28 LAB — HEMOGLOBIN A1C
Hgb A1c MFr Bld: 6.6 % — ABNORMAL HIGH (ref 4.8–5.6)
Mean Plasma Glucose: 142.72 mg/dL

## 2017-07-28 NOTE — Evaluation (Signed)
Physical Therapy Assessment and Plan  Patient Details  Name: Trevor Nunez MRN: [id number] Date of Birth: [birth year]  PT Diagnosis: Difficulty walking, Muscle weakness and Pain in joint Rehab Potential: Fair ELOS: 10-12 days   Today's Date: 07/28/2017 PT Individual Time: 0900-1000 PT Individual Time Calculation (min): 60 min    Problem List:  Patient Active Problem List   Diagnosis Date Noted  . Gout 07/27/2017  . Elbow pain   . Weakness of both lower extremities   . Neuropathic pain   . Stage 3 chronic kidney disease ([place])   . Acute idiopathic gout of right knee   . OSA (obstructive sleep apnea)   . Chronic diastolic congestive heart failure ([place])   . Medically noncompliant   . Numbness and tingling of both lower extremities   . Pain   . Bilateral leg weakness 07/20/2017  . Foot pain, right 05/07/2017  . Leg pain 01/27/2017  . Obesity 11/16/2016  . HTN (hypertension) 11/08/2016  . Chronic diastolic heart failure ([place]) 06/25/2016  . Snoring 06/25/2016  . CKD (chronic kidney disease), stage III ([place]) 06/15/2016  . Demand ischemia ([place]) 06/15/2016  . Pain and swelling of elbow   . Acute on chronic diastolic CHF (congestive heart failure) ([place])   . H/O medication noncompliance   . Chest pain with moderate risk for cardiac etiology 10/17/2015  . Malignant hypertension 10/17/2015  . AKI (acute kidney injury) ([place]) 10/17/2015    Past Medical History:  Past Medical History:  Diagnosis Date  . Chest pain    a. In setting of HTN Urgency w/ trop elevation-->05/2016 MV: no ischemia-->low risk.  . Chronic diastolic CHF (congestive heart failure) ([place])    a. 10/2015 Echo: EF 55-60%, no rwma; b. 05/2016 Echo: EF 65-70%, sev LVH, Gr2 DD, triv AI, mildly dil LA, nl RV size/fxn, mildly-mod dil RA.  . CKD (chronic kidney disease), stage III ([place])   . Dilated aortic root ([place])    a. 10/2015 Echo: Ao root 104m;  b. 05/2016 Echo: triv AI.  .Marland KitchenElevated troponin    a. 10/2015, 05/2016, & 10/2016  in the setting of HTN Urgency;  b. 05/2016 MV: no ischemia-->Low risk.  . H/O medication noncompliance   . Hypertension    Past Surgical History:  Past Surgical History:  Procedure Laterality Date  . CHOLECYSTECTOMY    . HERNIA REPAIR      Assessment & Plan Clinical Impression: Trevor Nunez a 52y.o. right-handed male with history of gout maintained on allopurinol, tobacco abuse and quit 23 months ago, morbid obesity, hypertension, CKD stage III, chronic diastolic congestive heart failure, recently diagnosed with OSA in February still awaiting CPAP machine at home, medical noncompliance.  Per chart review [place]  Independent up until 2 weeks ago.  He is unemployed.  Roommate does not work.  No local family.  Presented 07/20/2017 with lower extremity weakness times 2 weeks as well as significant pain anterior lateral aspect of left leg.  Noted fall landing on his right arm.  X-rays of right elbow showed joint effusion suspicious for occult fracture and initially placed with sugar tong cast removed 07/25/2017 plan follow-up outpatient orthopedic services Dr. TEdmonia Lynch MRI of the brain negative.  Lumbar puncture with protein 43, glucose 74.  HIV screen nonreactive.  Sedimentation rate 65.  Uric acid level elevated 13.4. MRI lumbar, cervical and thoracic spine showed lumbar spondylosis predominantly at L4-5 and L5-S1 levels.  No significant canal  stenosis and without fracture.  There was some neural foraminal narrowing at C3-4 through C6-7 moderate to severe on the right at C4-5.  X-rays of the right knee showed a large joint effusion with synovitis.  Small radial tear of the medial meniscus body.  High-grade cartilage loss in the medial and patellofemoral compartments.   Echocardiogram with ejection fraction of 46% grade 2 diastolic dysfunction.  Hep C antibody, ANA are pending.  Neurology recommends paraspinal EMG to be completed as outpatient with  possible paraspinal muscle biopsy but no further workup indicated for inpatient at this time.  Noted creatinine 2.19, troponin 0 0.09.  Renal ultrasound negative.  Follow-up renal services felt acute kidney injury complicated by chronic kidney disease and furosemide was decreased to 60 mg daily as well as advised him to continue Aldactone. Underwent right knee aspiration of joint effusion 07/25/2017 showing urate crystals with increased WBCs and placed on prednisone times 5 days for gout.   Subcutaneous heparin for DVT prophylaxis.  Venous Doppler studies lower extremities negative.  Therapy evaluations completed and ongoing with recommendations of physical medicine rehab consult .  Patient was admitted for a comprehensive rehab program 07/27/17.  Patient transferred to CIR on 07/27/2017 .   Patient currently requires min with mobility secondary to muscle weakness and muscle joint tightness and decreased standing balance and decreased balance strategies.  Prior to hospitalization, patient was independent  with mobility and lived with Friend(s) in a [place] home.  Home access is  Level entry.  Patient will benefit from skilled PT intervention to maximize safe functional mobility, minimize fall risk and decrease caregiver burden for planned discharge home with intermittent assist.  Anticipate patient will benefit from follow up [facility] at discharge.  PT - End of Session Activity Tolerance: Tolerates < 10 min activity with changes in vital signs Endurance Deficit: Yes PT Assessment Rehab Potential (ACUTE/IP ONLY): Fair PT Barriers to Discharge: Decreased caregiver support PT Patient demonstrates impairments in the following area(s): Balance;Endurance;Motor PT Transfers Functional Problem(s): Furniture;Car;Bed to Chair;Bed Mobility PT Locomotion Functional Problem(s): Ambulation;Wheelchair Mobility;Stairs PT Plan PT Intensity: Minimum of 1-2 x/day ,45 to 90 minutes PT Frequency: 5 out of 7 days PT Duration  Estimated Length of Stay: 10-12 days PT Treatment/Interventions: Ambulation/gait training;Discharge planning;Functional mobility training;Psychosocial support;Therapeutic Activities;Balance/vestibular training;Neuromuscular re-education;Therapeutic Exercise;Wheelchair propulsion/positioning;UE/LE Strength taining/ROM;DME/adaptive equipment instruction;Community reintegration;Functional electrical stimulation;Patient/family education;Stair training;UE/LE Coordination activities PT Transfers Anticipated Outcome(s): mod I PT Locomotion Anticipated Outcome(s): mod I with LRAD PT Recommendation Follow Up Recommendations: Home health PT Patient destination: Home Equipment Recommended: To be determined  Skilled Therapeutic Intervention C/o 9/10 pain at rest, reports premedicated.  Pain did not change throughout session and pt demonstrated no facial signs of pain with mobility.  Session focus on initial PT assessment, positioning in w/c, and education on rehab process, goals, length of stay, and safety.  Pt returned to room at end of session and positioned upright in recliner with call bell in reach and needs met.   PT Evaluation Precautions/Restrictions Precautions Precautions: Fall Precaution Comments: pt never had back precautions, was only using them for pain management Restrictions Weight Bearing Restrictions: No Pain Pain Assessment Pain Scale: 0-10 Pain Score: 9  Pain Type: Acute pain Pain Location: Leg Pain Orientation: Medial Pain Descriptors / Indicators: Stabbing Pain Frequency: Constant Pain Intervention(s): Medication (See eMAR) Home Living/Prior Functioning Home Living Available Help at Discharge: Family;Friend(s);Available 24 hours/day Type of Home: Apartment Home Access: Level entry Home Layout: One level  Lives With: Friend(s) Prior Function Level of Independence:  Independent with gait;Independent with transfers  Able to Take Stairs?: Yes Driving: Yes Vocation:  Retired Art gallery manager: Within [place]: Intact  Cognition Overall Cognitive Status: Within Functional Limits for tasks assessed Orientation Level: Oriented X4 Awareness: Appears intact Problem Solving: Appears intact Safety/Judgment: Appears intact Sensation Sensation Light Touch: Impaired Detail Light Touch Impaired Details: Impaired RLE(inconsistent responses to LT on RLE, intact to deep pressure throughout) Coordination Gross Motor Movements are Fluid and Coordinated: Yes Fine Motor Movements are Fluid and Coordinated: Yes Motor  Motor Motor: Other (comment) Motor - Skilled Clinical Observations: reports generalized weakness and pain in BLEs, L>R  Mobility Bed Mobility Bed Mobility: Supine to Sit Supine to Sit: 5: Supervision;HOB elevated;With rails Transfers Transfers: Yes Stand Pivot Transfers: 4: Min assist Stand Pivot Transfer Details: Verbal cues for technique;Verbal cues for sequencing;Verbal cues for precautions/safety Locomotion  Ambulation Ambulation: Yes Ambulation/Gait Assistance: 4: Min guard Ambulation Distance (Feet): 10 Feet Assistive device: (rail in hallway) Stairs / Additional Locomotion Stairs: No Wheelchair Mobility Wheelchair Mobility: No  Trunk/Postural Assessment  Cervical Assessment Cervical Assessment: Within Functional Limits Thoracic Assessment Thoracic Assessment: Within Functional Limits Lumbar Assessment Lumbar Assessment: Exceptions to WFL(preference for posterior pelvic tilt) Postural Control Postural Control: Deficits on evaluation Righting Reactions: slow Protective Responses: slow  Extremity Assessment      RLE Assessment RLE Assessment: Exceptions to [facility] RLE AROM (degrees) RLE Overall AROM Comments: WFL assessed in sitting RLE Strength RLE Overall Strength Comments: unable to tolerate pressure on RLE for resistance testing, at least 3+/5 based on observation of  functional mobility LLE Assessment LLE Assessment: Exceptions to [facility] LLE AROM (degrees) LLE Overall AROM Comments: WFL assessed in sitting LLE Strength LLE Overall Strength Comments: unable to tolerate resistance for formal testing, but at least 3+/5 based on observation of functional mobility   See Function Navigator for Current Functional Status.   Refer to Care Plan for Long Term Goals  Recommendations for other services: None   Discharge Criteria: Patient will be discharged from PT if patient refuses treatment 3 consecutive times without medical reason, if treatment goals not met, if there is a change in medical status, if patient makes no progress towards goals or if patient is discharged from hospital.  The above assessment, treatment plan, treatment alternatives and goals were discussed and mutually agreed upon: by patient  Michel Santee 07/28/2017, 10:04 AM

## 2017-07-28 NOTE — Progress Notes (Signed)
Occupational Therapy Session Note  Patient Details  Name: Trevor Nunez MRN: [id number] Date of Birth: 1965/05/09  Today's Date: 07/28/2017 OT Individual Time: 3419-3790 OT Individual Time Calculation (min): 72 min    Short Term Goals: Week 1:  OT Short Term Goal 1 (Week 1): Pt will be able to tolerate ambulation from bed to his toilet with LRAD with S. OT Short Term Goal 2 (Week 1): Pt will be able to don pants with S. OT Short Term Goal 3 (Week 1): Pt will be able to don socks with min A and/or sock aide.  Skilled Therapeutic Interventions/Progress Updates:  Pt greeted in w/c with c/o pain as described below. Pt set up to complete shaving task at sink, with vc for precautions and activity pacing. Discussion with pt re OT POC and goals to plan for d/c. Pt used hemi-walker to complete 71f of functional mobility with min A before requesting to sit d/t dizziness. Vitals were obtained, with pt's BP as described below, nursing aware. Pt completed 2013fof self-propulsion of w/c, requiring rest break every 1556fPt completed standing level table top task with no AE, requiring vc for self-correction of upright posture and even weight bearing through B LE. Pt complained of pain progressively getting worse following 5 min of standing and requested to sit. Discussion with pt re fall risk reduction. Pt left sitting up in w/c with all needs met.    Therapy Documentation Precautions:  Precautions Precautions: Fall Precaution Comments: pt never had back precautions, was only using them for pain management Restrictions Weight Bearing Restrictions: No Vital Signs: Therapy Vitals Temp: 97.8 F (36.6 C) Temp Source: Oral Pulse Rate: 72 Resp: 18 BP: (!) 149/100 Patient Position (if appropriate): Sitting Oxygen Therapy SpO2: 99 % O2 Device: Room Air Pain: Pain Assessment Pain Scale: 0-10 Pain Score: 8  Pain Type: Acute pain Pain Location: Leg Pain Orientation: Right Pain Descriptors /  Indicators: Stabbing;Sharp Pain Onset: With Activity Pain Intervention(s): Repositioned   Vision Baseline Vision/History: Wears glasses Wears Glasses: Distance only Patient Visual Report: No change from baseline Vision Assessment?: No apparent visual deficits Perception  Perception: Within Functional Limits Praxis Praxis: Intact Exercises:   See Function Navigator for Current Functional Status.   Therapy/Group: Individual Therapy  [place] Sites11/2019, 3:17 PM

## 2017-07-28 NOTE — Progress Notes (Signed)
Physical Medicine and Rehabilitation Consult Reason for Consult: Decreased functional mobility Referring Physician: Internal medicine   HPI: Trevor Nunez is a 52 y.o. right-handed male with history of tobacco abuse and quit 23 months ago, morbid obesity, hypertension, CKD stage III, chronic diastolic congestive heart failure, medical noncompliance.  Per chart review patient lives [place] a roommate in [place] .  Independent up until 2 weeks ago.  He is unemployed.  Roommate does not work.  No local family.  Presented 07/20/2017 with lower extremity weakness times 2 weeks as well as significant pain anterior lateral aspect of left leg.  Noted fall landing on his right arm.  MRI of the brain negative.  MRI lumbar, cervical and thoracic spine showed lumbar spondylosis predominantly at L4-5 and L5-S1 levels.  No significant canal stenosis and without fracture.  There was some neural foraminal narrowing at C3-4 through C6-7 moderate to severe on the right at C4-5.  X-rays of the right knee showed a large joint effusion with synovitis.  Small radial tear of the medial meniscus body.  High-grade cartilage loss in the medial and patellofemoral compartments.  Noted creatinine 2.19, troponin 0 0.09.  Renal ultrasound negative.  Underwent right knee aspiration of joint effusion 07/25/2017.  Neurology follow-up for lower extremity weakness noted unifying nonneurological etiology diagnosis tying together his edema, joint effusion and renal failure with workup ongoing.  Subcutaneous heparin for DVT prophylaxis.  Venous Doppler studies lower extremities negative.  Therapy evaluations completed and ongoing with recommendations of physical medicine rehab consult.   Review of Systems  Constitutional: Negative for fever.  HENT: Negative for hearing loss.   Eyes: Negative for blurred vision and double vision.  Respiratory: Negative for shortness of breath.   Cardiovascular: Positive for leg swelling. Negative for  chest pain.  Gastrointestinal: Positive for constipation. Negative for nausea.  Genitourinary: Negative for dysuria, flank pain and hematuria.  Musculoskeletal: Positive for joint pain and myalgias.  Skin: Negative for rash.  Neurological: Positive for weakness. Negative for seizures.  All other systems reviewed and are negative.      Past Medical History:  Diagnosis Date  . Chest pain    a. In setting of HTN Urgency w/ trop elevation-->05/2016 MV: no ischemia-->low risk.  . Chronic diastolic CHF (congestive heart failure) (HCC)    a. 10/2015 Echo: EF 55-60%, no rwma; b. 05/2016 Echo: EF 65-70%, sev LVH, Gr2 DD, triv AI, mildly dil LA, nl RV size/fxn, mildly-mod dil RA.  . CKD (chronic kidney disease), stage III (HCC)   . Dilated aortic root (HCC)    a. 10/2015 Echo: Ao root 41mm;  b. 05/2016 Echo: triv AI.  Marland Kitchen. Elevated troponin    a. 10/2015, 05/2016, & 10/2016 in the setting of HTN Urgency;  b. 05/2016 MV: no ischemia-->Low risk.  . H/O medication noncompliance   . Hypertension         Past Surgical History:  Procedure Laterality Date  . CHOLECYSTECTOMY    . HERNIA REPAIR          Family History  Problem Relation Age of Onset  . Hypertension Mother   . Heart attack Mother   . CVA Mother   . Heart disease Sister    Social History:  reports that he quit smoking about 23 months ago. He has never used smokeless tobacco. He reports that he does not drink alcohol or use drugs. Allergies:       Allergies  Allergen Reactions  . Codeine Palpitations  . Strawberry (  Diagnostic) Rash and Other (See Comments)    "Tongue swells/feels heavy"         Medications Prior to Admission  Medication Sig Dispense Refill  . aspirin 81 MG chewable tablet Chew 1 tablet (81 mg total) by mouth daily. 90 tablet 3  . carvedilol (COREG) 12.5 MG tablet Take 12.5 mg by mouth 2 (two) times daily with a meal.    . hydrALAZINE (APRESOLINE) 50 MG tablet Take 50 mg by mouth 3 (three)  times daily.    . potassium chloride SA (K-DUR,KLOR-CON) 20 MEQ tablet Take 1 tablet (20 mEq total) by mouth 2 (two) times daily. And take extra when taking metolazone 180 tablet 3  . spironolactone (ALDACTONE) 25 MG tablet Take 25 mg by mouth daily.    Marland Kitchen torsemide (DEMADEX) 20 MG tablet Take 60 mg by mouth 3 (three) times daily.     Marland Kitchen allopurinol (ZYLOPRIM) 100 MG tablet Take 1 tablet (100 mg total) by mouth daily. (Patient not taking: Reported on 07/20/2017) 30 tablet 0  . cyclobenzaprine (FLEXERIL) 5 MG tablet Take 1-2 tablets 3 times daily as needed (Patient not taking: Reported on 07/20/2017) 20 tablet 0  . lidocaine (LIDODERM) 5 % Place 1 patch onto the skin every 12 (twelve) hours. Remove & Discard patch within 12 hours or as directed by MD (Patient not taking: Reported on 07/20/2017) 10 patch 0  . metolazone (ZAROXOLYN) 2.5 MG tablet Take 1 tablet (2.5 mg total) by mouth daily. (Patient not taking: Reported on 07/20/2017) 3 tablet 0  . nitroGLYCERIN (NITROSTAT) 0.4 MG SL tablet Place 1 tablet (0.4 mg total) under the tongue every 5 (five) minutes as needed for chest pain. 20 tablet 1  . oxyCODONE-acetaminophen (PERCOCET) 5-325 MG tablet Take 1 tablet by mouth every 4 (four) hours as needed for severe pain. 6 tablet 0    Home: Home Living Family/patient expects to be discharged to:: Private residence Living Arrangements: Other relatives("roommate") Available Help at Discharge: Family, Friend(s) Type of Home: Apartment Home Access: Level entry Home Layout: One level Firefighter: Standard Home Equipment: None  Functional History: Prior Function Level of Independence: Independent Comments: pt was living at home with other family and has been walking with no AD Functional Status:  Mobility: Bed Mobility Overal bed mobility: Needs Assistance Bed Mobility: Rolling, Sidelying to Sit Rolling: Mod assist Sidelying to sit: Mod assist General bed mobility comments: +rail, increased  time and effort Transfers Overall transfer level: Needs assistance Equipment used: Rolling walker (2 wheeled) Transfers: Sit to/from Stand, Stand Pivot Transfers Sit to Stand: +2 safety/equipment, Mod assist Stand pivot transfers: Min assist, +2 safety/equipment General transfer comment: Pt able to take pivot steps bed to recliner with RW maintaining a flexed posture. Verbal cues for sequencing. Ambulation/Gait General Gait Details: unable due to pain  ADL: ADL Overall ADL's : Needs assistance/impaired Eating/Feeding: Set up, Bed level Eating/Feeding Details (indicate cue type and reason): setup assist to open drink containers Grooming: Set up, Bed level, Wash/dry face General ADL Comments: setup assist provided for bed level grooming ADLs; pt significantly limited due to pain/fatigue, requiring max-totalA for LB ADLs at bed level   Cognition: Cognition Overall Cognitive Status: Within Functional Limits for tasks assessed Orientation Level: Oriented X4 Cognition Arousal/Alertness: Awake/alert Behavior During Therapy: Flat affect, Agitated Overall Cognitive Status: Within Functional Limits for tasks assessed  Blood pressure (!) 118/96, pulse 78, temperature 98.1 F (36.7 C), resp. rate 16, height 6' (1.829 m), weight 135.9 kg (299 lb 9.7 oz),  SpO2 90 %. Physical Exam  Vitals reviewed. Constitutional: He is oriented to person, place, and time.  HENT:  Head: Normocephalic.  Eyes: EOM are normal.  Neck: Normal range of motion. Neck supple. No thyromegaly present.  Cardiovascular: Normal rate, regular rhythm and normal heart sounds.  Respiratory: Effort normal and breath sounds normal. No respiratory distress.  GI: Soft. Bowel sounds are normal. He exhibits no distension.  Musculoskeletal:  Right tender, effusion  Neurological: He is alert and oriented to person, place, and time.  Skin: Skin is warm and dry.  Assessment/Plan: Diagnosis: 52 yo morbidly obese male with  Mobility and functional deficits secondary to ?septic arthritis right knee, unclear etiology at this point for lower extremity sensory loss/weakness 1. Does the need for close, 24 hr/day medical supervision in concert with the patient's rehab needs make it unreasonable for this patient to be served in a less intensive setting? Yes 2. Co-Morbidities requiring supervision/potential complications: chf, ckd, htn 3. Due to bladder management, bowel management, safety, skin/wound care, disease management, medication administration, pain management and patient education, does the patient require 24 hr/day rehab nursing? Yes 4. Does the patient require coordinated care of a physician, rehab nurse, PT (1-2 hrs/day, 5 days/week) and OT (1-2 hrs/day, 5 days/week) to address physical and functional deficits in the context of the above medical diagnosis(es)? Yes Addressing deficits in the following areas: balance, endurance, locomotion, strength, transferring, bowel/bladder control, bathing, dressing, feeding, grooming, toileting and psychosocial support 5. Can the patient actively participate in an intensive therapy program of at least 3 hrs of therapy per day at least 5 days per week? Yes 6. The potential for patient to make measurable gains while on inpatient rehab is good 7. Anticipated functional outcomes upon discharge from inpatient rehab are modified independent  with PT, modified independent with OT, n/a with SLP. 8. Estimated rehab length of stay to reach the above functional goals is: potentially 8-13 days 9. Anticipated D/C setting: Home 10. Anticipated post D/C treatments: HH therapy 11. Overall Rehab/Functional Prognosis: excellent  RECOMMENDATIONS: This patient's condition is appropriate for continued rehabilitative care in the following setting: CIR Patient has agreed to participate in recommended program. Potentially Note that insurance prior authorization may be required for reimbursement for  recommended care.  Comment: Rehab Admissions Coordinator to follow up.  Thanks,  Ranelle Oyster, MD, [place] [facility]    Mcarthur Rossetti Angiulli, PA-C 07/25/2017          Revision History                        Routing History

## 2017-07-28 NOTE — Progress Notes (Signed)
PMR Admission Coordinator Pre-Admission Assessment  Patient: Trevor Nunez is an 52 y.o., male MRN: [id number] DOB: Oct 27, 1965 Height: 6' (182.9 cm) Weight: 134.2 kg (295 lb 13.7 oz)                                                                                                                                                  Insurance Information HMO:     PPO:      PCP:      IPA:      80/20:      OTHER:  PRIMARY: Medicaid [place] [facility]      Policy#: [id number]      Subscriber: Self CM Name:       Phone#:      Fax#:  Pre-Cert#: Coverage code: [id number]      Employer: Disabled  Benefits:  Phone #: [phone]     Name: Verified via automated system Eff. Date: Eligible as of 07/26/17     Deduct: N/A      Out of Pocket Max: N/A      Life Max: N/A CIR: Covered per Medicaid guidelines         Emergency Contact Information         Contact Information    Name Relation Home Work Mobile   Manuel,Clarence Brother [phone]     Blain Pais   [phone]     Current Medical History  Patient Admitting Diagnosis: 52 yo morbidly obese male with Mobility and functional deficits secondary to ?septic arthritis right knee, unclear etiology at this point for lower extremity sensory loss/weakness  History of Present Illness: Kordell Brooksis a 52 y.o.right-handed malewith history ofgout maintained on allopurinol,tobacco abuse and quit 23 months ago,morbid obesity, hypertension, CKD stage III, chronic diastolic congestive heart failure,recently diagnosed with OSA in February still awaiting CPAP machine at home,medical noncompliance.Per chart review patient lives with a roommate in [place], [place]. Independent up until 2 weeks ago. He is unemployed. Roommate does not work. No local family.Presented 07/20/2017 with lower extremity weakness times 2 weeks as well as significant pain anterior lateral aspect of left leg. Noted fall landing on his right arm.X-rays of right elbow  showed joint effusion suspicious for occult fracture and initially placed with sugar tong cast removed 07/25/2017 plan follow-up outpatient orthopedic services Dr. Margarita Rana.MRI of the brain negative.Lumbar puncture with protein 43, glucose 74. HIV screen nonreactive. Sedimentation rate 65.Uric acid level elevated 13.4.MRI lumbar, cervical and thoracic spine showed lumbar spondylosis predominantly at L4-5 and L5-S1 levels. No significant canal stenosis and without fracture. There was some neural foraminal narrowing at C3-4 through C6-7 moderate to severe on the right at C4-5. X-rays of the right knee showed a large joint effusion with synovitis. Small radial tear of the medial meniscus body. High-grade cartilage loss in the medial and patellofemoral compartments.Echocardiogram with ejection fraction of 70%  grade 2 diastolic dysfunction. Hep C antibody, ANA are pending. Neurology recommends paraspinal EMG to be completed as outpatient with possible paraspinal muscle biopsy but no further workup indicated for inpatient at this time. Noted creatinine 2.19, troponin 0 0.09. Renal ultrasound negative.Follow-up renal services felt acute kidney injury complicated by chronic kidney disease and furosemide was decreased to 60 mg daily as well as advised him to continue Aldactone.Underwent right knee aspiration of joint effusion 4/8/2019showing urate crystals with increased WBCs and placed on prednisone times 5 days for gout. Subcutaneous heparin for DVT prophylaxis. Venous Doppler studies lower extremities negative. Therapy evaluations completed and ongoing with recommendations of physical medicine rehab consult.Patient was admitted for a comprehensive rehab program 07/27/17.   Past Medical History      Past Medical History:  Diagnosis Date  . Chest pain    a. In setting of HTN Urgency w/ trop elevation-->05/2016 MV: no ischemia-->low risk.  . Chronic diastolic CHF (congestive  heart failure) (HCC)    a. 10/2015 Echo: EF 55-60%, no rwma; b. 05/2016 Echo: EF 65-70%, sev LVH, Gr2 DD, triv AI, mildly dil LA, nl RV size/fxn, mildly-mod dil RA.  . CKD (chronic kidney disease), stage III (HCC)   . Dilated aortic root (HCC)    a. 10/2015 Echo: Ao root 41mm;  b. 05/2016 Echo: triv AI.  Marland Kitchen Elevated troponin    a. 10/2015, 05/2016, & 10/2016 in the setting of HTN Urgency;  b. 05/2016 MV: no ischemia-->Low risk.  . H/O medication noncompliance   . Hypertension     Family History  family history includes CVA in his mother; Heart attack in his mother; Heart disease in his sister; Hypertension in his mother.  Prior Rehab/Hospitalizations:  Has the patient had major surgery during 100 days prior to admission? No  Current Medications   Current Facility-Administered Medications:  .  acetaminophen (TYLENOL) tablet 650 mg, 650 mg, Oral, Q6H PRN, 650 mg at 07/27/17 0548 **OR** acetaminophen (TYLENOL) suppository 650 mg, 650 mg, Rectal, Q6H PRN, Mikell, Antionette Poles, MD .  amitriptyline (ELAVIL) tablet 25 mg, 25 mg, Oral, QHS, Freddrick March, MD, 25 mg at 07/26/17 2102 .  aspirin chewable tablet 81 mg, 81 mg, Oral, Daily, Mikell, Antionette Poles, MD, 81 mg at 07/27/17 0958 .  carvedilol (COREG) tablet 12.5 mg, 12.5 mg, Oral, BID WC, Mikell, Antionette Poles, MD, 12.5 mg at 07/27/17 0958 .  gi cocktail (Maalox,Lidocaine,Donnatal), 30 mL, Oral, BID PRN, Howard Pouch, MD, 30 mL at 07/24/17 2111 .  heparin injection 5,000 Units, 5,000 Units, Subcutaneous, Q8H, Howard Pouch, MD, 5,000 Units at 07/27/17 0548 .  predniSONE (DELTASONE) tablet 50 mg, 50 mg, Oral, Q breakfast, Freddrick March, MD, 50 mg at 07/27/17 0958 .  spironolactone (ALDACTONE) tablet 25 mg, 25 mg, Oral, Daily, Mikell, Antionette Poles, MD, 25 mg at 07/27/17 0958 .  torsemide (DEMADEX) tablet 60 mg, 60 mg, Oral, Daily, Ellwood Dense, DO, 60 mg at 07/27/17 1324  Patients Current Diet: Diet Heart Room service appropriate? Yes;  Fluid consistency: Thin  Precautions / Restrictions Precautions Precautions: Back, Fall Precaution Booklet Issued: No Precaution Comments: pt able to recall 2/3 precautions, reviewed all back precations Restrictions Weight Bearing Restrictions: No   Has the patient had 2 or more falls or a fall with injury in the past year?Yes, 1 fall with injury of fracture to elbow  Prior Activity Level Community (5-7x/wk): Prior to admission patient was fully independent at home and in the community.   Home Assistive Devices / Equipment  Home Assistive Devices/Equipment: None Home Equipment: None  Prior Device Use: Indicate devices/aids used by the patient prior to current illness, exacerbation or injury? None of the above  Prior Functional Level Prior Function Level of Independence: Independent Comments: pt was living at home with other family and has been walking with no AD  Self Care: Did the patient need help bathing, dressing, using the toilet or eating? Independent  Indoor Mobility: Did the patient need assistance with walking from room to room (with or without device)? Independent  Stairs: Did the patient need assistance with internal or external stairs (with or without device)? Independent  Functional Cognition: Did the patient need help planning regular tasks such as shopping or remembering to take medications? Independent  Current Functional Level Cognition  Overall Cognitive Status: Within Functional Limits for tasks assessed Orientation Level: Oriented X4    Extremity Assessment (includes Sensation/Coordination)  Upper Extremity Assessment: RUE deficits/detail RUE Deficits / Details: RUE splinted/ace wrapped, pt demonstrates AROM approx 0-90*, cannot move beyond this range due to pain, able to move digits within available range that bandage permits  RUE: Unable to fully assess due to pain, Unable to fully assess due to immobilization  Lower Extremity Assessment:  Defer to PT evaluation RLE Deficits / Details: R leg is limited by pain to move but can generate mm contraction RLE: Unable to fully assess due to pain    ADLs  Overall ADL's : Needs assistance/impaired Eating/Feeding: Set up, Bed level Eating/Feeding Details (indicate cue type and reason): setup assist to open drink containers Grooming: Set up, Wash/dry face, Wash/dry hands, Sitting Upper Body Bathing: Supervision/ safety, Set up, Sitting Upper Body Bathing Details (indicate cue type and reason): simulated General ADL Comments: educated pt on use of ADL A/E for LB ADLs, pt states that he is aware of what the equipment is but that he doesn't need it to become dependent upon. Explained to  pt that A/E is used to increase his independence for the time being unitl PLOF is restored    Mobility  Overal bed mobility: Needs Assistance Bed Mobility: Rolling, Sidelying to Sit Rolling: Mod assist Sidelying to sit: Mod assist General bed mobility comments: used rail, increased time and effort    Transfers  Overall transfer level: Needs assistance Equipment used: Rolling walker (2 wheeled) Transfers: Sit to/from Stand, Stand Pivot Transfers Sit to Stand: +2 safety/equipment, Mod assist Stand pivot transfers: Min assist, +2 safety/equipment General transfer comment: pt declined standing, stating that he knows he can't since he tried with PT yesterday and wasn't able    Ambulation / Gait / Stairs / Wheelchair Mobility  Ambulation/Gait General Gait Details: unable due to pain    Posture / Balance Balance Overall balance assessment: Needs assistance Sitting-balance support: No upper extremity supported, Feet supported Sitting balance-Leahy Scale: Good Standing balance support: Bilateral upper extremity supported, During functional activity Standing balance-Leahy Scale: Poor Standing balance comment: declined standing with OT    Special needs/care consideration BiPAP/CPAP: Not PTA,  nut having a CPAP sent to house now CPM: No Continuous Drip IV: No Dialysis: No         Life Vest: No Oxygen: No Special Bed: No Trach Size: No Wound Vac (area): No       Skin: WDL per chart review                               Bowel mgmt: Continent, last BM 07/24/17 Bladder  mgmt: Continent with urinal  Diabetic mgmt: No     Previous Home Environment Living Arrangements: Other relatives("roommate") Available Help at Discharge: Family, Friend(s) Type of Home: Apartment Home Layout: One level Home Access: Level entry Firefighter: Standard Home Care Services: No  Discharge Living Setting Plans for Discharge Living Setting: Patient's home, Lives with (comment)(roommate ) Type of Home at Discharge: House Discharge Home Layout: One level Discharge Home Access: Level entry Discharge Bathroom Shower/Tub: Walk-in shower Discharge Bathroom Toilet: Standard Discharge Bathroom Accessibility: Yes How Accessible: Accessible via walker Does the patient have any problems obtaining your medications?: No  Social/Family/Support Systems Patient Roles: Other (Comment)(Brother and friend ) Anticipated Caregiver: N/A Mod I goals, but roommate can assist as needed Discharge Plan Discussed with Primary Caregiver: (Discussed with patient ) Is Caregiver In Agreement with Plan?: (Patient is in agreement with plan ) Does Caregiver/Family have Issues with Lodging/Transportation while Pt is in Rehab?: No  Goals/Additional Needs Patient/Family Goal for Rehab: PT/OT: Mod I  Expected length of stay: 8-13 days  Cultural Considerations: None Dietary Needs: Heart Healthy diet restrictions  Equipment Needs: TBD Pt/Family Agrees to Admission and willing to participate: Yes Program Orientation Provided & Reviewed with Pt/Caregiver Including Roles  & Responsibilities: Yes   Decrease burden of Care through IP rehab admission: No  Possible need for SNF placement upon discharge: No  Patient  Condition: This patient's condition remains as documented in the consult dated 07/25/17 at 3:40 pm, in which the Rehabilitation Physician determined and documented that the patient's condition is appropriate for intensive rehabilitative care in an inpatient rehabilitation facility. Will admit to inpatient rehab today.  Preadmission Screen Completed By:  Fae Pippin, 07/27/2017 11:44 AM ______________________________________________________________________   Discussed status with Dr. Allena Katz on 07/27/17 at 1200 and received telephone approval for admission today.  Admission Coordinator:  Fae Pippin, time 1200/Date 07/27/17             Cosigned by: Marcello Fennel, MD at 07/27/2017 12:13 PM  Revision History

## 2017-07-28 NOTE — Progress Notes (Signed)
[facility]     PROGRESS NOTE    Subjective/Complaints: Pt reports that he still is sore, particularly in his right knee.  Able to sleep.  Does have appetite.  ROS: Patient denies fever, rash, sore throat, blurred vision, nausea, vomiting, diarrhea, cough, shortness of breath or chest pain, joint or back pain, headache, or mood change.   Objective: Vital Signs: Blood pressure (!) 147/93, pulse 79, temperature 98 F (36.7 C), temperature source Oral, resp. rate 16, height 6' (1.829 m), weight 132.2 kg (291 lb 7.2 oz), SpO2 93 %. No results found. Recent Labs    07/27/17 0636  WBC 16.1*  HGB 13.8  HCT 41.4  PLT 525*   Recent Labs    07/26/17 0750 07/27/17 0636  NA 135 133*  K 4.3 3.8  CL 97* 95*  GLUCOSE 153* 192*  BUN 45* 50*  CREATININE 2.21* 2.08*  CALCIUM 9.5 8.8*   CBG (last 3)  No results for input(s): GLUCAP in the last 72 hours.  Wt Readings from Last 3 Encounters:  07/28/17 132.2 kg (291 lb 7.2 oz)  07/27/17 134.2 kg (295 lb 13.7 oz)  07/16/17 127 kg (280 lb)    Physical Exam:  Constitutional: No distress . Vital signs reviewed. obese HEENT: EOMI, oral membranes moist Cardiovascular: RRR without murmur. No JVD    Respiratory: CTA Bilaterally without wheezes or rales. Normal effort    GI: BS +, non-tender, non-distended  Musculoskeletal:  RUE edema and TTP, in splint. Right knee tender to palpation and PROM Neurological: He is alert.  Motor: LUE: 5/5 proximal to distal LLE: HF 4/5, KE 5/5, ADF 5/5 RLE: HF 4/5, KE 4/5, ADF 5/5 RUE: shoulder abduction 4/5, elbow dressed, hand grip 4+/5--motor exam stable Skin: RUE dressed, otherwise skin clean/intact Psychiatric: He has a normal mood and affect. His behavior is normal.     Assessment/Plan: 1. Functional deficits secondary to debility from multiple medical issues including gout which require 3+ hours per day of interdisciplinary therapy in a comprehensive inpatient  rehab setting. Physiatrist is providing close team supervision and 24 hour management of active medical problems listed below. Physiatrist and rehab team continue to assess barriers to discharge/monitor patient progress toward functional and medical goals.  Function:  Bathing Bathing position      Bathing parts      Bathing assist        Upper Body Dressing/Undressing Upper body dressing                    Upper body assist        Lower Body Dressing/Undressing Lower body dressing                                  Lower body assist        Toileting Toileting          Toileting assist     Transfers Chair/bed transfer             Locomotion Ambulation           Wheelchair          Cognition Comprehension Comprehension assist level: Follows complex conversation/direction with no assist  Expression Expression assist level: Expresses complex ideas: With no assist  Social Interaction Social Interaction assist level: Interacts appropriately with others - No medications needed.  Problem Solving Problem solving assist level: Solves complex problems: Recognizes &  self-corrects  Memory Memory assist level: Complete Independence: No helper   Medical Problem List and Plan: 1.  Decreased functional mobility secondary to bilateral lower extremity weakness and distal sensory loss, edema and decreased sensation.  Plan follow-up outpatient with neurology services for paraspinal EMG and possible paraspinal muscle biopsy   -beginning therapies today 2.  DVT Prophylaxis/Anticoagulation: Subcutaneous heparin.  Venous Doppler studies negative 3. Pain Management: Elavil 25 mg nightly   -ROM and local care to right knee/elbow  -prednisone as below 4. Mood: Provide emotional support 5. Neuropsych: This patient is capable of making decisions on his own behalf. 6. Skin/Wound Care: Routine skin checks 7. Fluids/Electrolytes/Nutrition:  Follow up labs pending  today   -encourage PO, appropriate dietary intake 8.  Acute kidney injury on chronic kidney disease stage III.  Continue Demadex 60 mg daily and Aldactone 25 mg daily.  Follow-up renal services 9.  Gout.  Uric acid level 13.4.  Right knee joint aspiration 07/25/2017.  Continue prednisone as directed 07/26/2017 x 5 days 10.  Hypertension.  Coreg 12.5 mg twice daily 11.  Right arm injury after fall.  Conservative care per orthopedic services and fitted with a posterior elbow splint.  Weightbearing as tolerated 12.  OSA.  Recently diagnosed in February.  Still awaiting CPAP machine at home.  Patient has been refusing. 13.  Diastolic congestive heart failure.  Monitor for any signs of fluid overload 14.  Medical noncompliance.  Provide counseling  -intermittent hyperglycemia   -check Hgb A1C  LOS (Days) 1 A FACE TO FACE EVALUATION WAS PERFORMED  Ranelle Oyster, MD 07/28/2017 8:36 AM

## 2017-07-28 NOTE — Evaluation (Signed)
Occupational Therapy Assessment and Plan  Patient Details  Name: Trevor Nunez MRN: [id number] Date of Birth: 1965/06/25  OT Diagnosis: acute pain and muscle weakness (generalized) Rehab Potential: Rehab Potential (ACUTE ONLY): Excellent ELOS: 10-12 days   Today's Date: 07/28/2017 OT Individual Time: 1105-1200 OT Individual Time Calculation (min): 55 min     Problem List:  Patient Active Problem List   Diagnosis Date Noted  . Gout 07/27/2017  . Elbow pain   . Weakness of both lower extremities   . Neuropathic pain   . Stage 3 chronic kidney disease ([place])   . Acute idiopathic gout of right knee   . OSA (obstructive sleep apnea)   . Chronic diastolic congestive heart failure ([place])   . Medically noncompliant   . Numbness and tingling of both lower extremities   . Pain   . Bilateral leg weakness 07/20/2017  . Foot pain, right 05/07/2017  . Leg pain 01/27/2017  . Obesity 11/16/2016  . HTN (hypertension) 11/08/2016  . Chronic diastolic heart failure ([place]) 06/25/2016  . Snoring 06/25/2016  . CKD (chronic kidney disease), stage III ([place]) 06/15/2016  . Demand ischemia ([place]) 06/15/2016  . Pain and swelling of elbow   . Acute on chronic diastolic CHF (congestive heart failure) ([place])   . H/O medication noncompliance   . Chest pain with moderate risk for cardiac etiology 10/17/2015  . Malignant hypertension 10/17/2015  . AKI (acute kidney injury) ([place]) 10/17/2015    Past Medical History:  Past Medical History:  Diagnosis Date  . Chest pain    a. In setting of HTN Urgency w/ trop elevation-->05/2016 MV: no ischemia-->low risk.  . Chronic diastolic CHF (congestive heart failure) ([place])    a. 10/2015 Echo: EF 55-60%, no rwma; b. 05/2016 Echo: EF 65-70%, sev LVH, Gr2 DD, triv AI, mildly dil LA, nl RV size/fxn, mildly-mod dil RA.  . CKD (chronic kidney disease), stage III ([place])   . Dilated aortic root ([place])    a. 10/2015 Echo: Ao root 94m;  b. 05/2016 Echo: triv AI.  .Marland KitchenElevated troponin     a. 10/2015, 05/2016, & 10/2016 in the setting of HTN Urgency;  b. 05/2016 MV: no ischemia-->Low risk.  . H/O medication noncompliance   . Hypertension    Past Surgical History:  Past Surgical History:  Procedure Laterality Date  . CHOLECYSTECTOMY    . HERNIA REPAIR      Assessment & Plan Clinical Impression:  TNazar Kuanis a 52y.o. right-handed male with history of gout maintained on allopurinol, tobacco abuse and quit 23 months ago, morbid obesity, hypertension, CKD stage III, chronic diastolic congestive heart failure, recently diagnosed with OSA in February still awaiting CPAP machine at home, medical noncompliance.  Per chart review and patient, patient lives [place] roommate in [place]  Independent up until 2 weeks ago.  He is unemployed.  Roommate does not work.  No local family.  Presented 07/20/2017 with lower extremity weakness times 2 weeks as well as significant pain anterior lateral aspect of left leg.  Noted fall landing on his right arm.  X-rays of right elbow showed joint effusion suspicious for occult fracture and initially placed with sugar tong cast removed 07/25/2017 plan follow-up outpatient orthopedic services Dr. TEdmonia Lynch MRI of the brain reviewed, unremarkable for acute intracranial process. Lumbar puncture with protein 43, glucose 74.  HIV screen nonreactive.  Sedimentation rate 65.  Uric acid level elevated 13.4. MRI lumbar, cervical and thoracic spine showed lumbar spondylosis  predominantly at L4-5 and L5-S1 levels.  No significant canal stenosis and without fracture.  There was some neural foraminal narrowing at C3-4 through C6-7 moderate to severe on the right at C4-5.  X-rays of the right knee showed a large joint effusion with synovitis.  Small radial tear of the medial meniscus body.  High-grade cartilage loss in the medial and patellofemoral compartments.   Echocardiogram with ejection fraction of 87% grade 2 diastolic dysfunction.  Hep C antibody, ANA  are pending.  Neurology recommends paraspinal EMG to be completed as outpatient with possible paraspinal muscle biopsy but no further workup indicated for inpatient at this time. Noted creatinine 2.19, troponin 0 0.09.  Renal ultrasound negative.  Follow-up renal services felt acute kidney injury complicated by chronic kidney disease and furosemide was decreased to 60 mg daily as well as advised him to continue Aldactone. Underwent right knee aspiration of joint effusion 07/25/2017 showing urate crystals with increased WBCs and placed on prednisone times 5 days for gout.   Subcutaneous heparin for DVT prophylaxis.  Venous Doppler studies lower extremities negative.  Therapy evaluations completed and ongoing with recommendations of physical medicine rehab consult .  Patient was admitted for a comprehensive rehab program    Patient transferred to CIR on 07/27/2017 .    Patient currently requires min with basic self-care skills secondary to muscle weakness, decreased cardiorespiratoy endurance and decreased standing balance and decreased balance strategies.  Prior to hospitalization, patient was fully independent.  Patient will benefit from skilled intervention to increase independence with basic self-care skills prior to discharge home with care partner.  Anticipate patient will require intermittent A with IADLS and no follow up OT. OT - End of Session Activity Tolerance: Tolerates 10 - 20 min activity with multiple rests(dizziness in standing) Endurance Deficit: Yes OT Assessment Rehab Potential (ACUTE ONLY): Excellent OT Patient demonstrates impairments in the following area(s): Balance;Pain;Endurance OT Basic ADL's Functional Problem(s): Bathing;Dressing;Toileting;Grooming OT Advanced ADL's Functional Problem(s): Light Housekeeping OT Transfers Functional Problem(s): Toilet;Tub/Shower OT Additional Impairment(s): None OT Plan OT Intensity: Minimum of 1-2 x/day, 45 to 90 minutes OT Frequency: 5 out  of 7 days OT Duration/Estimated Length of Stay: 10-12 days OT Treatment/Interventions: Balance/vestibular training;Discharge planning;DME/adaptive equipment instruction;Functional mobility training;Patient/family education;Self Care/advanced ADL retraining;Therapeutic Activities;Therapeutic Exercise;UE/LE Strength taining/ROM OT Self Feeding Anticipated Outcome(s): no goal OT Basic Self-Care Anticipated Outcome(s): mod I OT Toileting Anticipated Outcome(s): mod I OT Bathroom Transfers Anticipated Outcome(s): mod I OT Recommendation Patient destination: Home Follow Up Recommendations: None Equipment Recommended: To be determined   Skilled Therapeutic Intervention Pt seen for initial evaluation and ADL training with a focus on balance, activity tolerance and pain management.  Discussed role of OT and pt's goals for his rehab stay.  Despite intense foot pain, pt was highly motivated in session and put in a great deal of effort.  He felt dizzy with standing so did not attempt ambulation, although he was able to take 4-5 steps from recliner to w/c with hemiwalker. Pt then used w/c to enter bathroom. Completed stand pivot to toilet with steadying A.  He then returned to w/c to complete sponge bath at sink.  Pt is able to flex R elbow to wash his L arm and was able to stand at sink with S.  He needs A to reach feet for washing, donning pants and socks.  Completed grooming in sitting.  Discussed ELOS and OT POC.  Pt in room with all needs met.  OT Evaluation Precautions/Restrictions  Precautions Precautions: Fall  Precaution Comments: pt never had back precautions, was only using them for pain management Restrictions Weight Bearing Restrictions: No Pain Pain Assessment Pain Scale: 0-10 Pain Score: 9  Pain Type: Acute pain Pain Location: Leg Pain Orientation: Right Pain Descriptors / Indicators: Stabbing Pain Frequency: Constant Pain Intervention(s): Medication (See eMAR) Home Living/Prior  Verdi expects to be discharged to:: Private residence Living Arrangements: Non-relatives/Friends(roomate) Available Help at Discharge: Family, Friend(s), Available 24 hours/day Type of Home: Apartment Home Access: Level entry Home Layout: One level Bathroom Shower/Tub: Walk-in shower, Door Additional Comments: have a shower seat and grab bars in walk in shower  Lives With: Friend(s) Prior Function Level of Independence: Independent with gait, Independent with transfers, Independent with basic ADLs, Independent with homemaking with ambulation  Able to Take Stairs?: Yes Driving: Yes Vocation: Retired Comments: pt was living at home with friend and has been walking with no AD ADL  refer to functional navigator Vision Baseline Vision/History: Wears glasses Wears Glasses: Distance only Patient Visual Report: No change from baseline Vision Assessment?: No apparent visual deficits Perception  Perception: Within Functional Limits Praxis Praxis: Intact Cognition Overall Cognitive Status: Within Functional Limits for tasks assessed Arousal/Alertness: Awake/alert Orientation Level: Person;Place;Situation Person: Oriented Place: Oriented Situation: Oriented Year: 2019 Month: April Day of Week: Correct Memory: Appears intact Immediate Memory Recall: Sock;Blue;Bed Memory Recall: Sock;Blue;Bed Memory Recall Sock: Without Cue Memory Recall Blue: With Cue Memory Recall Bed: Without Cue Awareness: Appears intact Problem Solving: Appears intact Safety/Judgment: Appears intact Sensation Sensation Light Touch: Impaired Detail Light Touch Impaired Details: Impaired RLE Stereognosis: Appears Intact Hot/Cold: Appears Intact Proprioception: Appears Intact Coordination Gross Motor Movements are Fluid and Coordinated: Yes Fine Motor Movements are Fluid and Coordinated: Yes Motor  Motor Motor: Other (comment) Motor - Skilled Clinical Observations:  reports generalized weakness and pain in BLEs, L>R Mobility  Bed Mobility Bed Mobility: Supine to Sit Supine to Sit: 5: Supervision;HOB elevated;With rails  Trunk/Postural Assessment  Cervical Assessment Cervical Assessment: Within Functional Limits Thoracic Assessment Thoracic Assessment: Within Functional Limits Lumbar Assessment Lumbar Assessment: Exceptions to WFL(preference for posterior pelvic tilt) Postural Control Postural Control: Deficits on evaluation Righting Reactions: slow Protective Responses: slow  Balance Static Standing Balance Static Standing - Level of Assistance: 5: Stand by assistance Dynamic Standing Balance Dynamic Standing - Level of Assistance: 4: Min assist Extremity/Trunk Assessment RUE Assessment RUE Assessment: Exceptions to WFL(R elbow in splint and pt can lift arm to 90 degrees, further AROM pulls on his elbow) LUE Assessment LUE Assessment: Within Functional Limits   See Function Navigator for Current Functional Status.   Refer to Care Plan for Long Term Goals  Recommendations for other services: None    Discharge Criteria: Patient will be discharged from OT if patient refuses treatment 3 consecutive times without medical reason, if treatment goals not met, if there is a change in medical status, if patient makes no progress towards goals or if patient is discharged from hospital.  The above assessment, treatment plan, treatment alternatives and goals were discussed and mutually agreed upon: by patient  [facility] 07/28/2017, 12:35 PM

## 2017-07-29 ENCOUNTER — Inpatient Hospital Stay (HOSPITAL_COMMUNITY): Payer: Medicaid Other | Admitting: Physical Therapy

## 2017-07-29 ENCOUNTER — Inpatient Hospital Stay (HOSPITAL_COMMUNITY): Payer: Medicaid Other | Admitting: Occupational Therapy

## 2017-07-29 ENCOUNTER — Inpatient Hospital Stay (HOSPITAL_COMMUNITY): Payer: Medicaid Other

## 2017-07-29 DIAGNOSIS — M1000 Idiopathic gout, unspecified site: Secondary | ICD-10-CM

## 2017-07-29 DIAGNOSIS — N179 Acute kidney failure, unspecified: Secondary | ICD-10-CM

## 2017-07-29 DIAGNOSIS — R5381 Other malaise: Secondary | ICD-10-CM

## 2017-07-29 DIAGNOSIS — N189 Chronic kidney disease, unspecified: Secondary | ICD-10-CM

## 2017-07-29 DIAGNOSIS — I10 Essential (primary) hypertension: Secondary | ICD-10-CM

## 2017-07-29 NOTE — Progress Notes (Signed)
Physical Therapy Session Note  Patient Details  Name: Trevor Nunez MRN: [id number] Date of Birth: Mar 19, 1966  Today's Date: 07/29/2017 PT Individual Time: 1115-1200 PT Individual Time Calculation (min): 45 min   Short Term Goals: Week 1:  PT Short Term Goal 1 (Week 1): Pt will transfer with supervision and LRAD PT Short Term Goal 2 (Week 1): Pt will ambulate 5950' with LRAD and min guard PT Short Term Goal 3 (Week 1): Pt will initiate stair training with PT for strengthening and balance   Skilled Therapeutic Interventions/Progress Updates:    Pt seated in w/c in room, agreeable to PT. Pt reports 8/10 pain in BLE, just received pain medication from nursing. Sit to stand with min assist to hemiwalker. Ambulation 3 x 12 ft with hemiwalker and min assist, focus on heel strike with gait as pt tends to just scoot his feet forwards to take steps. Pt requires rest breaks between each bout of ambulation due to poor endurance and LE pain. Pt also reports feeling dizzy when standing, BP 136/89 and pt asymptomatic otherwise. Pt left seated in w/c in room set up for lunch, needs in reach, ice pack to L thigh for pain.  Therapy Documentation Precautions:  Precautions Precautions: Fall Precaution Comments: pt never had back precautions, was only using them for pain management Restrictions Weight Bearing Restrictions: No  See Function Navigator for Current Functional Status.   Therapy/Group: Individual Therapy  Peter Congoaylor Brittinee Risk, PT, DPT  07/29/2017, 2:57 PM

## 2017-07-29 NOTE — IPOC Note (Signed)
Overall Plan of Care [facility]) Patient Details Name: Trevor Nunez MRN: [id number] DOB: 1965/12/21  Admitting Diagnosis: <principal problem not specified>  Hospital Problems: Active Problems:   Gout     Functional Problem List: Nursing Endurance, Medication Management, Motor, Pain, Sensory, Skin Integrity, Safety  PT Balance, Endurance, Motor  OT Balance, Pain, Endurance  SLP    TR         Basic ADL's: OT Bathing, Dressing, Toileting, Grooming     Advanced  ADL's: OT Light Housekeeping     Transfers: PT Furniture, Car, Bed to Chair, [employer], Research scientist (life sciences): PT Ambulation, Psychologist, prison and probation services, Stairs     Additional Impairments: OT None  SLP        TR      Anticipated Outcomes Item Anticipated Outcome  Self Feeding no goal  Swallowing      Basic self-care  mod I  Toileting  mod I   Bathroom Transfers mod I  Bowel/Bladder  mod I assist  Transfers  mod I  Locomotion  mod I with LRAD  Communication     Cognition     Pain  < 4  Safety/Judgment  Mod I assist   Therapy Plan: PT Intensity: Minimum of 1-2 x/day ,45 to 90 minutes PT Frequency: 5 out of 7 days PT Duration Estimated Length of Stay: 10-12 days OT Intensity: Minimum of 1-2 x/day, 45 to 90 minutes OT Frequency: 5 out of 7 days OT Duration/Estimated Length of Stay: 10-12 days      Team Interventions: Nursing Interventions Patient/Family Education, Disease Management/Prevention, Skin Care/Wound Management, Pain Management, Discharge Planning  PT interventions Ambulation/gait training, Discharge planning, Functional mobility training, Psychosocial support, Therapeutic Activities, Balance/vestibular training, Neuromuscular re-education, Therapeutic Exercise, Wheelchair propulsion/positioning, UE/LE Strength taining/ROM, DME/adaptive equipment instruction, Community reintegration, Development worker, international aid stimulation, Patient/family education, Museum/gallery curator, UE/LE  Coordination activities  OT Interventions Warden/ranger, Discharge planning, DME/adaptive equipment instruction, Functional mobility training, Patient/family education, Self Care/advanced ADL retraining, Therapeutic Activities, Therapeutic Exercise, UE/LE Strength taining/ROM  SLP Interventions    TR Interventions    SW/CM Interventions Discharge Planning, Psychosocial Support, Patient/Family Education   Barriers to Discharge MD  Medical stability, Weight and Medication compliance  Nursing      PT Decreased caregiver support    OT      SLP      SW       Team Discharge Planning: Destination: PT-Home ,OT- Home , SLP-  Projected Follow-up: PT-Home health PT, OT-  None, SLP-  Projected Equipment Needs: PT-To be determined, OT- To be determined, SLP-  Equipment Details: PT- , OT-  Patient/family involved in discharge planning: PT- Patient,  OT-Patient, SLP-   MD ELOS: 9-14d Medical Rehab Prognosis:  Good Assessment:  52 y.o.right-handed malewith history of gout maintained on allopurinol,tobacco abuse and quit 23 months ago,morbid obesity, hypertension, CKD stage III, chronic diastolic congestive heart failure, recently diagnosed with OSA in February still awaiting CPAP machine at home, medical noncompliance.Per chart review and patient, patient lives with a roommate in [place]. Independent up until 2 weeks ago. He is unemployed. Roommate does not work. No local family.Presented 07/20/2017 with lower extremity weakness times 2 weeks as well as significant pain anterior lateral aspect of left leg. Noted fall landing on his right arm.  X-rays of right elbow showed joint effusion suspicious for occult fracture and initially placed with sugar tong cast removed 07/25/2017 plan follow-up outpatient orthopedic services Dr. Margarita Rana.MRI of the brain  reviewed, unremarkable for acute intracranial process. Lumbar puncture with protein 43, glucose 74.  HIV screen  nonreactive.  Sedimentation rate 65.  Uric acid level elevated 13.4.MRI lumbar, cervical and thoracic spine showed lumbar spondylosis predominantly at L4-5 and L5-S1 levels. No significant canal stenosis and without fracture. There was some neural foraminal narrowing at C3-4 through C6-7 moderate to severe on the right at C4-5. X-rays of the right knee showed a large joint effusion with synovitis. Small radial tear of the medial meniscus body. High-grade cartilage loss in the medial and patellofemoral compartments.  Echocardiogram with ejection fraction of 70% grade 2 diastolic dysfunction.  Hep C antibody, ANA are pending.  Neurology recommends paraspinal EMG to be completed as outpatient    Now requiring 24/7 Rehab RN,MD, as well as CIR level PT, OT and SLP.  Treatment team will focus on ADLs and mobility with goals set at Mod I   See Team Conference Notes for weekly updates to the plan of care

## 2017-07-29 NOTE — Progress Notes (Signed)
[place] PHYSICAL MEDICINE & REHABILITATION     PROGRESS NOTE    Subjective/Complaints: No new issues overnite  ROS: Patient denies fever, rash, sore throat, blurred vision, nausea, vomiting, diarrhea, cough, shortness of breath or chest pain, joint or back pain, headache, or mood change.   Objective: Vital Signs: Blood pressure 123/75, pulse 69, temperature 97.8 F (36.6 C), resp. rate 17, height 6' (1.829 m), weight 135.8 kg (299 lb 6.4 oz), SpO2 94 %. No results found. Recent Labs    07/27/17 0636 07/28/17 0704  WBC 16.1* 16.2*  HGB 13.8 13.1  HCT 41.4 40.5  PLT 525* 532*   Recent Labs    07/27/17 0636 07/28/17 0704  NA 133* 137  K 3.8 3.7  CL 95* 100*  GLUCOSE 192* 124*  BUN 50* 50*  CREATININE 2.08* 2.07*  CALCIUM 8.8* 8.9   CBG (last 3)  No results for input(s): GLUCAP in the last 72 hours.  Wt Readings from Last 3 Encounters:  07/29/17 135.8 kg (299 lb 6.4 oz)  07/27/17 134.2 kg (295 lb 13.7 oz)  07/16/17 127 kg (280 lb)    Physical Exam:  Constitutional: No distress . Vital signs reviewed. obese HEENT: EOMI, oral membranes moist Cardiovascular: RRR without murmur. No JVD    Respiratory: CTA Bilaterally without wheezes or rales. Normal effort    GI: BS +, non-tender, non-distended  Musculoskeletal:  RUE edema and TTP, in splint. Right knee tender to palpation and PROM Neurological: He is alert.  Motor: LUE: 5/5 proximal to distal LLE: HF 4/5, KE 5/5, ADF 5/5 RLE: HF 4/5, KE 4/5, ADF 5/5 RUE: shoulder abduction 4/5, elbow dressed, hand grip 4+/5--motor exam stable Skin: RUE dressed, otherwise skin clean/intact Psychiatric: He has a normal mood and affect. His behavior is normal.     Assessment/Plan: 1. Functional deficits secondary to debility from multiple medical issues including gout which require 3+ hours per day of interdisciplinary therapy in a comprehensive inpatient rehab setting. Physiatrist is providing close team supervision and 24  hour management of active medical problems listed below. Physiatrist and rehab team continue to assess barriers to discharge/monitor patient progress toward functional and medical goals.  Function:  Bathing Bathing position   Position: Wheelchair/chair at sink  Bathing parts Body parts bathed by patient: Right arm, Left arm, Chest, Abdomen, Front perineal area, Buttocks, Right upper leg, Left upper leg Body parts bathed by helper: Right lower leg, Left lower leg, Back  Bathing assist        Upper Body Dressing/Undressing Upper body dressing   What is the patient wearing?: Pull over shirt/dress     Pull over shirt/dress - Perfomed by patient: Thread/unthread right sleeve, Thread/unthread left sleeve, Put head through opening, Pull shirt over trunk          Upper body assist Assist Level: More than reasonable time      Lower Body Dressing/Undressing Lower body dressing   What is the patient wearing?: Underwear, Pants, Ted Hose, Non-skid slipper socks Underwear - Performed by patient: Thread/unthread left underwear leg, Pull underwear up/down Underwear - Performed by helper: Thread/unthread right underwear leg Pants- Performed by patient: Pull pants up/down Pants- Performed by helper: Thread/unthread right pants leg, Thread/unthread left pants leg   Non-skid slipper socks- Performed by helper: Don/doff right sock, Don/doff left sock               TED Hose - Performed by helper: Don/doff right TED hose, Don/doff left TED hose  Lower body  assist        Toileting Toileting   Toileting steps completed by patient: Adjust clothing prior to toileting, Adjust clothing after toileting, Performs perineal hygiene      Toileting assist Assist level: Touching or steadying assistance (Pt.75%)   Transfers Chair/bed transfer   Chair/bed transfer method: Stand pivot Chair/bed transfer assist level: Touching or steadying assistance (Pt > 75%) Chair/bed transfer assistive device:  Armrests     Locomotion Ambulation     Max distance: 10 Assist level: Touching or steadying assistance (Pt > 75%)   Wheelchair   Type: Manual Max wheelchair distance: 60 Assist Level: Supervision or verbal cues  Cognition Comprehension Comprehension assist level: Follows complex conversation/direction with no assist  Expression Expression assist level: Expresses complex ideas: With no assist  Social Interaction Social Interaction assist level: Interacts appropriately with others - No medications needed.  Problem Solving Problem solving assist level: Solves complex problems: Recognizes & self-corrects  Memory Memory assist level: Complete Independence: No helper   Medical Problem List and Plan: 1.  Decreased functional mobility secondary to bilateral lower extremity weakness and distal sensory loss, edema and decreased sensation.  Plan follow-up outpatient with neurology services for paraspinal EMG and possible paraspinal muscle biopsy   -Continue CIR PT OT 2.  DVT Prophylaxis/Anticoagulation: Subcutaneous heparin.  Venous Doppler studies negative 3. Pain Management: Elavil 25 mg nightly   -ROM and local care to right knee/elbow  -prednisone as below 4. Mood: Provide emotional support 5. Neuropsych: This patient is capable of making decisions on his own behalf. 6. Skin/Wound Care: Routine skin checks 7. Fluids/Electrolytes/Nutrition:  Follow up labs pending today   -encourage PO, appropriate dietary intake 8.  Acute kidney injury on chronic kidney disease stage III.  Continue Demadex 60 mg daily and Aldactone 25 mg daily.  Follow-up renal services 9.  Gout.  Uric acid level 13.4.  Right knee joint aspiration 07/25/2017.  Continue prednisone as directed 07/26/2017 x 5 days Watch for rebound pain once it is discontinued 10.  Hypertension.  Coreg 12.5 mg twice daily Vitals:   07/28/17 1405 07/29/17 0524  BP: (!) 149/100 123/75  Pulse: 72 69  Resp: 18 17  Temp: 97.8 F (36.6 C) 97.8  F (36.6 C)  SpO2: 99% 94%  Controlled 07/29/2017 11.  Right arm injury after fall.  Conservative care per orthopedic services and fitted with a posterior elbow splint.  Weightbearing as tolerated 12.  OSA.  Recently diagnosed in February.  Still awaiting CPAP machine at home.  Patient has been refusing. 13.  Diastolic congestive heart failure.  Monitor for any signs of fluid overload 14.  Medical noncompliance.  Provide counseling  -intermittent hyperglycemia   -check Hgb A1C  LOS (Days) 2 A FACE TO FACE EVALUATION WAS PERFORMED  Erick ColaceAndrew E Ova Meegan, MD 07/29/2017 8:49 AM

## 2017-07-29 NOTE — Progress Notes (Signed)
Occupational Therapy Note  Patient Details  Name: Trevor Nunez MRN: [id number] Date of Birth: Sep 05, 1965  Today's Date: 07/29/2017 OT Individual Time: 1000-1030 OT Individual Time Calculation (min): 30 min   Pt c/o "terrible" pain in BLE and RUE; RN aware Individual Therapy  Pt resting in w/c upon arrival.  Pt stated he didn't sleep well the previous night and was "hurting really bad" but determined to get better and get home.  Pt agreeable to therapy although he stated he had just finished PT.  OT intervention with focus on home setup and LTGs.  Pt agreeable to LTG of mod I at home.  Pt has walk in shower with bench, grab bars, and hand held shower head.  Pt stated he slept in recliner (lift chair) secondary to CHF and unable to tolerate sleeping in bed.  Pt c/o TED hose too tight and XLG ordered.  Pt returned to room and remained seated in w/c with all needs within reach.    Lavone NeriLanier, Abdifatah Colquhoun [facility] 07/29/2017, 10:45 AM

## 2017-07-29 NOTE — Progress Notes (Signed)
Physical Therapy Session Note  Patient Details  Name: Trevor Nunez MRN: [id number] Date of Birth: [birth year]  Today's Date: 07/29/2017 PT Individual Time: 0900-0959 PT Individual Time Calculation (min): 59 min   Short Term Goals: Week 1:  PT Short Term Goal 1 (Week 1): Pt will transfer with supervision and LRAD PT Short Term Goal 2 (Week 1): Pt will ambulate 14' with LRAD and min guard PT Short Term Goal 3 (Week 1): Pt will initiate stair training with PT for strengthening and balance   Skilled Therapeutic Interventions/Progress Updates:    reports pain is "horrible" but does not rate, and agreeable to therapy.  Pt also states he didn't sleep well last night.  Session focus on transfers, short distance ambulation, and LE therex/stretching for strengthening and pain management.  Pt requires increased time to complete all tasks due to pain.    Supine>sit using bed rails with HOB elevated, pt requires supervision.  Pt completes squat/pivot transfers throughout session with min guard for safety.  Sit<>stand x2 with min assist to maintain forward weight shift.  Gait x5' with hemiwalker and min guard, with cues for approaching therapy mat to sit.  PT instructed pt in 2x10 reps bilaterally (1.5# ankle weights and level 3 theraband): LAQ, hip flexion, hip abduction, and ball squeezes.  Pt transitioned to semi-reclined on mat with wedge for trunk support.  PT provided PROM stretch to hamstrings, gastroc, and IT band from semi-reclined position on LLE only.  Pt returned to room in w/c at end of session and positioned upright in w/c with ice applied to L hip.  Call bell in reach and needs met.   Therapy Documentation Precautions:  Precautions Precautions: Fall Precaution Comments: pt never had back precautions, was only using them for pain management Restrictions Weight Bearing Restrictions: No   See Function Navigator for Current Functional Status.   Therapy/Group: Individual Therapy  Michel Santee 07/29/2017, 9:59 AM

## 2017-07-29 NOTE — Progress Notes (Signed)
Social Work  Social Work Assessment and Plan  Patient Details  Name: Trevor Nunez MRN: [id number] Date of Birth: 04-13-1966  Today's Date: 07/29/2017  Problem List:  Patient Active Problem List   Diagnosis Date Noted  . Leukocytosis   . Transaminitis   . Prediabetes   . Medication noncompliance due to cognitive impairment   . Gout 07/27/2017  . Elbow pain   . Weakness of both lower extremities   . Neuropathic pain   . Stage 3 chronic kidney disease (HCC)   . Acute idiopathic gout of right knee   . OSA (obstructive sleep apnea)   . Chronic diastolic congestive heart failure (HCC)   . Medically noncompliant   . Numbness and tingling of both lower extremities   . Pain   . Bilateral leg weakness 07/20/2017  . Foot pain, right 05/07/2017  . Leg pain 01/27/2017  . Obesity 11/16/2016  . HTN (hypertension) 11/08/2016  . Chronic diastolic heart failure (HCC) 06/25/2016  . Snoring 06/25/2016  . CKD (chronic kidney disease), stage III (HCC) 06/15/2016  . Demand ischemia (HCC) 06/15/2016  . Pain and swelling of elbow   . Acute on chronic diastolic CHF (congestive heart failure) (HCC)   . H/O medication noncompliance   . Chest pain with moderate risk for cardiac etiology 10/17/2015  . Malignant hypertension 10/17/2015  . AKI (acute kidney injury) (HCC) 10/17/2015   Past Medical History:  Past Medical History:  Diagnosis Date  . Chest pain    a. In setting of HTN Urgency w/ trop elevation-->05/2016 MV: no ischemia-->low risk.  . Chronic diastolic CHF (congestive heart failure) (HCC)    a. 10/2015 Echo: EF 55-60%, no rwma; b. 05/2016 Echo: EF 65-70%, sev LVH, Gr2 DD, triv AI, mildly dil LA, nl RV size/fxn, mildly-mod dil RA.  . CKD (chronic kidney disease), stage III (HCC)   . Dilated aortic root (HCC)    a. 10/2015 Echo: Ao root 41mm;  b. 05/2016 Echo: triv AI.  Marland Kitchen Elevated troponin    a. 10/2015, 05/2016, & 10/2016 in the setting of HTN Urgency;  b. 05/2016 MV: no ischemia-->Low risk.  .  H/O medication noncompliance   . Hypertension    Past Surgical History:  Past Surgical History:  Procedure Laterality Date  . CHOLECYSTECTOMY    . HERNIA REPAIR     Social History:  reports that he quit smoking about 1 years ago. He has never used smokeless tobacco. He reports that he does not drink alcohol or use drugs.  Family / Support Systems Marital Status: Single Patient Roles: Parent, Other (Comment)(brother, friend) Children: Pt has one child, aged 75, living in [place] with his mother Other Supports: roomate/ friend, Lasandra Beech @ (C) [phone];  brother-in-law, Marilu Favre Manual @ (C) [phone] (living in [place]) Anticipated Caregiver: N/A Mod I goals, but roommate can assist as needed Caregiver Availability: Intermittent Family Dynamics: Pt notes that he has several family members living in or close to [place].  None living locally.  Good relationship with all.  Social History Preferred language: English Religion: Unknown Cultural Background: NA Read: Yes Write: Yes Employment Status: Disabled Fish farm manager Issues: None Guardian/Conservator: None - per MD, pt is capable of making decisions on his own behalf.   Abuse/Neglect Abuse/Neglect Assessment Can Be Completed: Yes Physical Abuse: Denies Verbal Abuse: Denies Sexual Abuse: Denies Exploitation of patient/patient's resources: Denies Self-Neglect: Denies  Emotional Status Pt's affect, behavior adn adjustment status: Pt easily engaged in assessment interview, however, makes repeated statements about  his concerns of not having a confirmed diagnosis.  He is concerned that "without knowing what caused this (pointing to legs and referring to loss of function), then what if it happens again when I'm driving or something?"  He states that he is pleased that he is beginning to regain some function and strength but still wants "to know what happened before I go home."  He denies any significant emotional  distress, however, dependent on progress, may benefit from consult with neuropsychology.  Recent Psychosocial Issues: Patient reports [place] current location approximately 1 year ago to be closer to family. Pyschiatric History: None Substance Abuse History: None  Patient / Family Perceptions, Expectations & Goals Pt/Family understanding of illness & functional limitations: As reported, patient very concerned with the lack of definitive diagnosis for his loss of function in the lower extremities.  He is very aware and understands his current functional limitations and the need for CIR, however, is hopeful he will get a definitive diagnosis before he leaves the hospital. Premorbid pt/family roles/activities: Patient was completely independent PTA Anticipated changes in roles/activities/participation: Little to no change in roles in the home if patient able to reach a mod independent goals. Pt/family expectations/goals: "I just will caused this."  [employer] Community Agencies: None Premorbid Home Care/DME Agencies: None Transportation available at discharge: Yes Resource referrals recommended: Neuropsychology  Discharge Planning Living Arrangements: Non-relatives/Friends Support Systems: Other relatives, Manufacturing engineerriends/neighbors, Psychologist, clinicalChurch/faith community Type of Residence: Private residence Insurance Resources: [employer]icaid (specify county) Surveyor, quantityinancial Resources: SSI Financial Screen Referred: No Living Expenses: Psychologist, sport and exerciseent Money Management: Patient Does the patient have any problems obtaining your medications?: No Home Management: Patient and roommate share responsibilities. Patient/Family Preliminary Plans: Patient plans to return home with roommate who can provide intermittent assistance. Social Work Anticipated Follow Up Needs: HH/OP Expected length of stay: 10-12 days  Clinical Impression Unfortunate gentleman who was admitted to the hospital due to functional  decline, however, etiology is unclear still at this time and notes suggests further workup as an outpatient.  Patient expresses much concern about lack of definitive diagnosis and makes repeated comments about wanting to have this before he leaves the hospital.  Patient does have goals for modified independent.  His support of the home is a roommate who can provide intermittent assistance.  He denies any significant emotional distress aside from his concerns about "knowing what caused this".  Social work to follow for support and discharge planning needs.  Vanita Cannell 07/29/2017, 4:30 PM

## 2017-07-29 NOTE — Progress Notes (Signed)
Occupational Therapy Session Note  Patient Details  Name: Tedra Senegalroy Wimes MRN: [id number] Date of Birth: July 07, 1965  Today's Date: 07/29/2017 OT Individual Time: 1300-1400 OT Individual Time Calculation (min): 60 min    Short Term Goals: Week 1:  OT Short Term Goal 1 (Week 1): Pt will be able to tolerate ambulation from bed to his toilet with LRAD with S. OT Short Term Goal 2 (Week 1): Pt will be able to don pants with S. OT Short Term Goal 3 (Week 1): Pt will be able to don socks with min A and/or sock aide.  Skilled Therapeutic Interventions/Progress Updates:    1:1 Therapeutic activity with focus on sit to stands while maintain NWB on right UE, bilateral LE strengthening/ stretching, functional ambulation with hemi walker. While laying back in reclined position on mat performed bridging, straight leg raises, modified bicycling movements LEs with rest break in between sets. Pt's right UE rewrapped with nursing due to splint coming undone. Ambulated two times with improvement with more time and distance- picking up feet instead of sliding them.   Left resting in the recliner with heat on left thigh.     Therapy Documentation Precautions:  Precautions Precautions: Fall Precaution Comments: pt never had back precautions, was only using them for pain management Restrictions Weight Bearing Restrictions: No General:   Vital Signs: Therapy Vitals Temp: 98.4 F (36.9 C) Temp Source: Oral Pulse Rate: 68 Resp: 18 BP: 137/84 Patient Position (if appropriate): Lying Oxygen Therapy SpO2: 95 % O2 Device: Room Air Pain: Pain Assessment Pain Score: 8   Ongoing in bilateral LEs- applied heat again to left lateral thigh as requested See Function Navigator for Current Functional Status.   Therapy/Group: Individual Therapy  Roney MansSmith, Gearldene Fiorenza [facility] 07/29/2017, 3:14 PM

## 2017-07-30 ENCOUNTER — Inpatient Hospital Stay (HOSPITAL_COMMUNITY): Payer: Medicaid Other | Admitting: Physical Therapy

## 2017-07-30 ENCOUNTER — Inpatient Hospital Stay (HOSPITAL_COMMUNITY): Payer: Medicaid Other | Admitting: Occupational Therapy

## 2017-07-30 DIAGNOSIS — R7401 Elevation of levels of liver transaminase levels: Secondary | ICD-10-CM

## 2017-07-30 DIAGNOSIS — N183 Chronic kidney disease, stage 3 (moderate): Secondary | ICD-10-CM

## 2017-07-30 DIAGNOSIS — R7303 Prediabetes: Secondary | ICD-10-CM

## 2017-07-30 DIAGNOSIS — D72829 Elevated white blood cell count, unspecified: Secondary | ICD-10-CM

## 2017-07-30 DIAGNOSIS — I5032 Chronic diastolic (congestive) heart failure: Secondary | ICD-10-CM

## 2017-07-30 DIAGNOSIS — Z9114 Patient's other noncompliance with medication regimen: Secondary | ICD-10-CM

## 2017-07-30 DIAGNOSIS — R740 Nonspecific elevation of levels of transaminase and lactic acid dehydrogenase [LDH]: Secondary | ICD-10-CM

## 2017-07-30 LAB — URINALYSIS, COMPLETE (UACMP) WITH MICROSCOPIC
BACTERIA UA: NONE SEEN
BILIRUBIN URINE: NEGATIVE
Glucose, UA: NEGATIVE mg/dL
Hgb urine dipstick: NEGATIVE
Ketones, ur: NEGATIVE mg/dL
Leukocytes, UA: NEGATIVE
Nitrite: NEGATIVE
PROTEIN: NEGATIVE mg/dL
RBC / HPF: NONE SEEN RBC/hpf (ref 0–5)
SPECIFIC GRAVITY, URINE: 1.005 (ref 1.005–1.030)
SQUAMOUS EPITHELIAL / LPF: NONE SEEN
WBC, UA: NONE SEEN WBC/hpf (ref 0–5)
pH: 6 (ref 5.0–8.0)

## 2017-07-30 NOTE — Plan of Care (Signed)
  Problem: Consults Goal: RH GENERAL PATIENT EDUCATION Description See Patient Education module for education specifics. Outcome: Progressing Goal: Skin Care Protocol Initiated - if Braden Score 18 or less Description If consults are not indicated, leave blank or document N/A Outcome: Progressing   Problem: RH SKIN INTEGRITY Goal: RH STG SKIN FREE OF INFECTION/BREAKDOWN Description Min assist.  Outcome: Progressing Goal: RH STG ABLE TO PERFORM INCISION/WOUND CARE W/ASSISTANCE Description STG Able To Perform Incision/Wound Care With min Assistance.  Outcome: Progressing   Problem: RH SAFETY Goal: RH STG ADHERE TO SAFETY PRECAUTIONS W/ASSISTANCE/DEVICE Description STG Adhere to Safety Precautions With min Assistance/Device.  Outcome: Progressing   Problem: RH PAIN MANAGEMENT Goal: RH STG PAIN MANAGED AT OR BELOW PT'S PAIN GOAL Description < 4  Outcome: Not Progressing   Patient complains of pain 9-10/10.  Pain keeps him awake at night.  Dani Gobbleeardon, Charidy Cappelletti J, RN

## 2017-07-30 NOTE — Progress Notes (Signed)
Occupational Therapy Session Note  Patient Details  Name: Trevor Nunez MRN: [id number] Date of Birth: 28-Aug-1965  Today's Date: 07/30/2017 OT Individual Time: [id number] and 1315-1400 OT Individual Time Calculation (min): 70 min and 45 min Missed 15 mins due to phone call   Short Term Goals: Week 1:  OT Short Term Goal 1 (Week 1): Pt will be able to tolerate ambulation from bed to his toilet with LRAD with S. OT Short Term Goal 2 (Week 1): Pt will be able to don pants with S. OT Short Term Goal 3 (Week 1): Pt will be able to don socks with min A and/or sock aide.  Skilled Therapeutic Interventions/Progress Updates:    1) Treatment session with focus on activity tolerance, functional transfers, and pain management.  Pt received supine in bed reporting pain 9/10 in BLE, however reports recently receiving something for pain.  Completed bed mobility with increased time secondary to pain and stand pivot transfer with hemi-walker to w/c with steady assist.  Pt engaged in UB bathing/dressing with setup and declined LB bathing/dressing as he reports nursing staff gave him a "bed bath" last night.  Therapist assisted pt in donning TEDS with use of plastic piece to decrease friction and pain when donning TEDS.  Pt reports being in too much pain to attempt donning socks, therefore therapist donned socks.  Grooming tasks completed in sitting while engaging in conversation about pain management and energy conservation strategies.  Set up simulated walk-in shower to simulate pt home setup and educated on various entry techniques to increase safety.  Pt able to step over 3" shower ledge while holding on to rail in hallway to simulate pt grab bar placement, therapist providing min guard during transfer.  Recommended use of DME and AE to increase independence in shower while also allowing pt to conserve energy.  Pt ambulated 1516' with hemi-walker with steady assist and noted increased dragging of feet due to pain.  2)  Treatment session with focus on therapeutic activity and therex.  Pt received upright stating that he was having a lot of pain but willing to engage in BLE exercises.  Pt requested therapist return later due to phone call, therefore pt missed initial 15 mins.  Engaged in LAQ, hip flexion, knee extension, and marching in place in sitting with focus on tolerance of increased activity.  Pt required frequent rest breaks due to pain and discomfort.  Pt reports pain meds not relieving any pain and that he is not sleeping at night.  Notified RN of pt's complaints.    Therapy Documentation Precautions:  Precautions Precautions: Fall Precaution Comments: pt never had back precautions, was only using them for pain management Restrictions Weight Bearing Restrictions: No Pain:  Pt reports pain 9/10 in BLE, premedicated.  See Function Navigator for Current Functional Status.   Therapy/Group: Individual Therapy  Rosalio LoudHOXIE, Trevor Nunez 07/30/2017, 9:41 AM

## 2017-07-30 NOTE — Progress Notes (Signed)
Physical Therapy Session Note  Patient Details  Name: Trevor Nunez MRN: [id number] Date of Birth: 04/02/1966  Today's Date: 07/30/2017 PT Individual Time: 0929-1002 PT Individual Time Calculation (min): 33 min    Skilled Therapeutic Interventions/Progress Updates:    Session initiated with pt reporting 9/10 pain due to his "pinched nerve". Pt states that he has had pain meds this morning. Initial BP: 153/100.  Session focused on improving pain control via pain neuroscience education and instruction in breathing/relaxation exercise x 10 min.  Following relaxation, BP decreased to 134/100 but pain remained at 9/10.  Attempted sit to stand to hemi-walker x 1 reps and pt reports increased dizziness.  BP: 150/100.  Nursing notified and states that okay to continue - does not think measurement is correct.  Larger BP cuff ordered.  Pt then ambulated 10 ft with hemi-walker, maintaining NWB with R UE and reports dizziness and not feeling well.  After return to sitting: BP: 146/100.  Treatment stopped due to pt symptoms and increased BP.  Nursing notified.  Pt left up in wheelchair with call bell in reach and needs met.  Therapy Documentation Precautions:  Precautions Precautions: Fall Precaution Comments: pt never had back precautions, was only using them for pain management Restrictions Weight Bearing Restrictions: No General: PT Amount of Missed Time (min): 27 Minutes PT Missed Treatment Reason: Other (Comment)       See Function Navigator for Current Functional Status.   Therapy/Group: Individual Therapy  Norval Slaven Hilario Quarry 07/30/2017, 10:07 AM

## 2017-07-30 NOTE — Progress Notes (Signed)
[facility] PHYSICAL MEDICINE & REHABILITATION     PROGRESS NOTE    Subjective/Complaints: Patient seen lying in bed this morning. He states he slept rarely overnight due to pain in his feet, but did not ask for pain medications..  ROS: denies CP, SOB, nausea, vomiting, diarrhea.  Objective: Vital Signs: Blood pressure (!) 137/97, pulse 72, temperature 98.1 F (36.7 C), temperature source Oral, resp. rate 16, height 6' (1.829 m), weight (!) 137.5 kg (303 lb 2.1 oz), SpO2 93 %. No results found. Recent Labs    07/28/17 0704  WBC 16.2*  HGB 13.1  HCT 40.5  PLT 532*   Recent Labs    07/28/17 0704  NA 137  K 3.7  CL 100*  GLUCOSE 124*  BUN 50*  CREATININE 2.07*  CALCIUM 8.9   CBG (last 3)  No results for input(s): GLUCAP in the last 72 hours.  Wt Readings from Last 3 Encounters:  07/30/17 (!) 137.5 kg (303 lb 2.1 oz)  07/27/17 134.2 kg (295 lb 13.7 oz)  07/16/17 127 kg (280 lb)    Physical Exam:  Constitutional: No distress . Vital signs reviewed. obese HENT: normocephalic. Atraumatic. Eyes: EOMI, no discharge Cardiovascular: RRR. No JVD    Respiratory: CTA Bilaterally. Normal effort    GI: BS +, non-distended  Musculoskeletal: RUE edema and TTP, in splint. Right knee tender to palpation and PROM Neurological: He is alert.  Motor: LUE: 5/5 proximal to distal LLE: HF 4/5, KE 5/5, ADF 5/5 RLE: HF 4/5, KE 4/5, ADF 5/5 RUE: shoulder abduction 4/5, elbow dressed, hand grip 4+/5 (stable) Skin: RUE with dressing C/D/I Psychiatric: He has a normal mood and affect. His behavior is normal.    Assessment/Plan: 1. Functional deficits secondary to debility from multiple medical issues including gout which require 3+ hours per day of interdisciplinary therapy in a comprehensive inpatient rehab setting. Physiatrist is providing close team supervision and 24 hour management of active medical problems listed below. Physiatrist and rehab team continue to assess barriers to  discharge/monitor patient progress toward functional and medical goals.  Function:  Bathing Bathing position   Position: Wheelchair/chair at sink  Bathing parts Body parts bathed by patient: Right arm, Left arm, Chest, Abdomen, Front perineal area, Buttocks, Right upper leg, Left upper leg Body parts bathed by helper: Right lower leg, Left lower leg, Back  Bathing assist        Upper Body Dressing/Undressing Upper body dressing   What is the patient wearing?: Pull over shirt/dress     Pull over shirt/dress - Perfomed by patient: Thread/unthread right sleeve, Thread/unthread left sleeve, Put head through opening, Pull shirt over trunk          Upper body assist Assist Level: More than reasonable time      Lower Body Dressing/Undressing Lower body dressing   What is the patient wearing?: Underwear, Pants, Ted Hose, Non-skid slipper socks Underwear - Performed by patient: Thread/unthread left underwear leg, Pull underwear up/down Underwear - Performed by helper: Thread/unthread right underwear leg Pants- Performed by patient: Pull pants up/down Pants- Performed by helper: Thread/unthread right pants leg, Thread/unthread left pants leg   Non-skid slipper socks- Performed by helper: Don/doff right sock, Don/doff left sock               TED Hose - Performed by helper: Don/doff right TED hose, Don/doff left TED hose  Lower body assist        Toileting Toileting   Toileting steps completed by patient:  Adjust clothing prior to toileting, Adjust clothing after toileting, Performs perineal hygiene      Toileting assist Assist level: Touching or steadying assistance (Pt.75%)   Transfers Chair/bed transfer   Chair/bed transfer method: Stand pivot Chair/bed transfer assist level: Touching or steadying assistance (Pt > 75%) Chair/bed transfer assistive device: Armrests, Other(hemiwalker)     Locomotion Ambulation     Max distance: (10 ft) Assist level: Touching or  steadying assistance (Pt > 75%)   Wheelchair   Type: Manual Max wheelchair distance: 60 Assist Level: Supervision or verbal cues  Cognition Comprehension Comprehension assist level: Follows complex conversation/direction with no assist  Expression Expression assist level: Expresses complex ideas: With no assist  Social Interaction Social Interaction assist level: Interacts appropriately with others - No medications needed.  Problem Solving Problem solving assist level: Solves complex problems: Recognizes & self-corrects  Memory Memory assist level: Complete Independence: No helper   Medical Problem List and Plan: 1.  Decreased functional mobility secondary to bilateral lower extremity weakness and distal sensory loss, edema and decreased sensation.  Plan follow-up outpatient with neurology services for paraspinal EMG and possible paraspinal muscle biopsy   -Continue CIR 2.  DVT Prophylaxis/Anticoagulation: Subcutaneous heparin.  Venous Doppler studies negative 3. Pain Management: Elavil 25 mg nightly   -ROM and local care to right knee/elbow  -prednisone as below 4. Mood: Provide emotional support 5. Neuropsych: This patient is capable of making decisions on his own behalf. 6. Skin/Wound Care: Routine skin checks 7. Fluids/Electrolytes/Nutrition:  Follow up labs pending today   -encourage PO, appropriate dietary intake 8.  Acute kidney injury on chronic kidney disease stage III.  Continue Demadex 60 mg daily and Aldactone 25 mg daily.  Follow-up renal services   Creatinine 2.07 on 4/11   Continue to monitor 9.  Gout.  Uric acid level 13.4.  Right knee joint aspiration 07/25/2017.  Continue prednisone as directed 07/26/2017 x 5 days   Watch for rebound pain once it is discontinued 10.  Hypertension.  Coreg 12.5 mg twice daily Vitals:   07/30/17 0525 07/30/17 1017  BP: (!) 146/89 (!) 137/97  Pulse: 64 72  Resp: 16   Temp: 98.1 F (36.7 C)   SpO2: 93%    Relatively controlled on  4/13 11.  Right arm injury after fall.  Conservative care per orthopedic services and fitted with a posterior elbow splint.  Weightbearing as tolerated 12.  OSA.  Recently diagnosed in February.  Still awaiting CPAP machine at home.  Patient has been refusing. 13.  Diastolic congestive heart failure.  Monitor for any signs of fluid overload Filed Weights   07/29/17 0618 07/29/17 2214 07/30/17 0500  Weight: 135.8 kg (299 lb 6.4 oz) (!) 139 kg (306 lb 7 oz) (!) 137.5 kg (303 lb 2.1 oz)    ? Reliability 14.  Prediabetes with Medical noncompliance.    Provide counseling  -intermittent hyperglycemia 15. Transaminitis  LFTs elevated since 4/4   Labs ordered for Monday, will consider further workup if persistently elevated 16. Leukocytosis   WBC 16.2 on 4/11, Should trend down now that prednisone has been completed  Afebrile  UA ordered  Labs ordered for Monday     LOS (Days) 3 A FACE TO FACE EVALUATION WAS PERFORMED  Ankit Karis Juba, MD 07/30/2017 10:28 AM

## 2017-07-31 ENCOUNTER — Inpatient Hospital Stay (HOSPITAL_COMMUNITY): Payer: Medicaid Other

## 2017-07-31 DIAGNOSIS — R52 Pain, unspecified: Secondary | ICD-10-CM

## 2017-07-31 MED ORDER — GABAPENTIN 100 MG PO CAPS: 200.0000 mg | ORAL_CAPSULE | Freq: Three times a day (TID) | ORAL | Status: DC

## 2017-07-31 MED ORDER — PREGABALIN 50 MG PO CAPS
50.0000 mg | ORAL_CAPSULE | Freq: Two times a day (BID) | ORAL | Status: DC
  Administered 2017-07-31 – 2017-08-02 (×5): 50 mg via ORAL
  Filled 2017-07-31 (×5): qty 1

## 2017-07-31 NOTE — Progress Notes (Signed)
Physical Therapy Session Note  Patient Details  Name: Tedra Senegalroy Walts MRN: [id number] Date of Birth: 05-28-1965  Today's Date: 07/31/2017 PT Individual Time: 1030-1100 PT Individual Time Calculation (min): 30 min   Short Term Goals: Week 1:  PT Short Term Goal 1 (Week 1): Pt will transfer with supervision and LRAD PT Short Term Goal 2 (Week 1): Pt will ambulate 8150' with LRAD and min guard PT Short Term Goal 3 (Week 1): Pt will initiate stair training with PT for strengthening and balance   Skilled Therapeutic Interventions/Progress Updates:    Pt seated in w/c upon PT arrival, agreeable to therapy tx and reports pain 9/10 B LEs. Pt transported to gym in w/c total assist. Session focused on ambulation trials with use of R platform RW. Pt ambulated 2 x 15 ft with R platform RW, min guard assist and verbal cues for techniques. Pt reports he feels much more stable with this device vs. Hemiwalker. Pt transported back to room and left seated with needs in reach.   Therapy Documentation Precautions:  Precautions Precautions: Fall Precaution Comments: pt never had back precautions, was only using them for pain management Restrictions Weight Bearing Restrictions: No   See Function Navigator for Current Functional Status.   Therapy/Group: Individual Therapy  Cresenciano GenreEmily van Schagen, PT, DPT 07/31/2017, 7:50 AM

## 2017-07-31 NOTE — Progress Notes (Signed)
PHYSICAL MEDICINE & REHABILITATION     PROGRESS NOTE    Subjective/Complaints: Patient seen lying in bed this morning. He states he slept better overnight. He states he continues to have leg pain and has questions about etiology. Is looking forward to watching golf today.  ROS: denies CP, SOB, nausea, vomiting, diarrhea.  Objective: Vital Signs: Blood pressure 118/82, pulse 80, temperature 97.7 F (36.5 C), temperature source Oral, resp. rate 16, height 6' (1.829 m), weight 135.2 kg (298 lb 1 oz), SpO2 95 %. No results found. No results for input(s): WBC, HGB, HCT, PLT in the last 72 hours. No results for input(s): NA, K, CL, GLUCOSE, BUN, CREATININE, CALCIUM in the last 72 hours.  Invalid input(s): CO CBG (last 3)  No results for input(s): GLUCAP in the last 72 hours.  Wt Readings from Last 3 Encounters:  07/31/17 135.2 kg (298 lb 1 oz)  07/27/17 134.2 kg (295 lb 13.7 oz)  07/16/17 127 kg (280 lb)    Physical Exam:  Constitutional: No distress . Vital signs reviewed. obese HENT: normocephalic. Atraumatic. Eyes: EOMI, no discharge Cardiovascular: RRR. No JVD    Respiratory: CTA Bilaterally. Normal effort    GI: BS +, non-distended  Musculoskeletal: RUE edema and TTP, in splint. Right knee tender to palpation and PROM Neurological: He is alert.  Motor: LUE: 5/5 proximal to distal LLE: HF 4/5, KE 5/5, ADF 5/5 RLE: HF 4/5, KE 4/5, ADF 5/5 RUE: shoulder abduction 4/5, elbow dressed, hand grip 4+/5 (unchanged) Skin: RUE with dressing C/D/I Psychiatric: He has a normal mood and affect. His behavior is normal.    Assessment/Plan: 1. Functional deficits secondary to debility from multiple medical issues including gout which require 3+ hours per day of interdisciplinary therapy in a comprehensive inpatient rehab setting. Physiatrist is providing close team supervision and 24 hour management of active medical problems listed below. Physiatrist and rehab team  continue to assess barriers to discharge/monitor patient progress toward functional and medical goals.  Function:  Bathing Bathing position   Position: Wheelchair/chair at sink  Bathing parts Body parts bathed by patient: Right arm, Left arm, Chest, Abdomen, Front perineal area, Buttocks, Right upper leg, Left upper leg Body parts bathed by helper: Right lower leg, Left lower leg, Back  Bathing assist        Upper Body Dressing/Undressing Upper body dressing   What is the patient wearing?: Pull over shirt/dress     Pull over shirt/dress - Perfomed by patient: Thread/unthread right sleeve, Thread/unthread left sleeve, Put head through opening, Pull shirt over trunk          Upper body assist Assist Level: More than reasonable time      Lower Body Dressing/Undressing Lower body dressing   What is the patient wearing?: Underwear, Pants, Ted Hose, Non-skid slipper socks Underwear - Performed by patient: Thread/unthread left underwear leg, Pull underwear up/down Underwear - Performed by helper: Thread/unthread right underwear leg Pants- Performed by patient: Pull pants up/down Pants- Performed by helper: Thread/unthread right pants leg, Thread/unthread left pants leg   Non-skid slipper socks- Performed by helper: Don/doff right sock, Don/doff left sock               TED Hose - Performed by helper: Don/doff right TED hose, Don/doff left TED hose  Lower body assist        Toileting Toileting   Toileting steps completed by patient: Adjust clothing prior to toileting, Performs perineal hygiene, Adjust clothing after toileting  Toileting assist Assist level: Touching or steadying assistance (Pt.75%)   Transfers Chair/bed transfer   Chair/bed transfer method: Stand pivot Chair/bed transfer assist level: Touching or steadying assistance (Pt > 75%) Chair/bed transfer assistive device: Armrests, Other(hemiwalker)     Locomotion Ambulation     Max distance: (10  ft) Assist level: Touching or steadying assistance (Pt > 75%)   Wheelchair   Type: Manual Max wheelchair distance: 60 Assist Level: Supervision or verbal cues  Cognition Comprehension Comprehension assist level: Follows complex conversation/direction with no assist  Expression Expression assist level: Expresses complex ideas: With no assist  Social Interaction Social Interaction assist level: Interacts appropriately with others - No medications needed.  Problem Solving Problem solving assist level: Solves complex problems: Recognizes & self-corrects  Memory Memory assist level: Complete Independence: No helper   Medical Problem List and Plan: 1.  Decreased functional mobility secondary to bilateral lower extremity weakness and distal sensory loss, edema and decreased sensation.  Plan follow-up outpatient with neurology services for paraspinal EMG and possible paraspinal muscle biopsy   -Continue CIR 2.  DVT Prophylaxis/Anticoagulation: Subcutaneous heparin.  Venous Doppler studies negative 3. Pain Management: Elavil 25 mg nightly   -ROM and local care to right knee/elbow   Gabapentin 200 3 times a day started on 4/14  -prednisone as below 4. Mood: Provide emotional support 5. Neuropsych: This patient is capable of making decisions on his own behalf. 6. Skin/Wound Care: Routine skin checks 7. Fluids/Electrolytes/Nutrition:     -encourage PO, appropriate dietary intake 8.  Acute kidney injury on chronic kidney disease stage III.  Continue Demadex 60 mg daily and Aldactone 25 mg daily.  Follow-up renal services   Creatinine 2.07 on 4/11   Continue to monitor 9.  Gout.  Uric acid level 13.4.  Right knee joint aspiration 07/25/2017.  Prednisone as directed 07/26/2017 x 5 days completed   Watch for rebound pain once it is discontinued 10.  Hypertension.  Coreg 12.5 mg twice daily Vitals:   07/30/17 1510 07/31/17 0545  BP: (!) 140/96 118/82  Pulse: 80 80  Resp: 16 16  Temp: 98.4 F (36.9  C) 97.7 F (36.5 C)  SpO2: 95% 95%   Relatively controlled on 4/14 11.  Right arm injury after fall.  Conservative care per orthopedic services and fitted with a posterior elbow splint.  Weightbearing as tolerated 12.  OSA.  Recently diagnosed in February.  Still awaiting CPAP machine at home.  Patient has been refusing. 13.  Diastolic congestive heart failure.  Monitor for any signs of fluid overload Filed Weights   07/29/17 2214 07/30/17 0500 07/31/17 0545  Weight: (!) 139 kg (306 lb 7 oz) (!) 137.5 kg (303 lb 2.1 oz) 135.2 kg (298 lb 1 oz)    ? improving 14.  Prediabetes with Medical noncompliance.    Provide counseling  -intermittent hyperglycemia 15. Transaminitis  LFTs elevated since 4/4   Labs ordered for tomorrow, will consider further workup if persistently elevated 16. Leukocytosis   WBC 16.2 on 4/11, Should trend down now that prednisone has been completed  Afebrile  UA ordered  Labs ordered for tomorrow     LOS (Days) 4 A FACE TO FACE EVALUATION WAS PERFORMED  Alexza Norbeck Karis Juba, MD 07/31/2017 7:55 AM

## 2017-08-01 ENCOUNTER — Inpatient Hospital Stay (HOSPITAL_COMMUNITY): Payer: Medicaid Other

## 2017-08-01 ENCOUNTER — Inpatient Hospital Stay (HOSPITAL_COMMUNITY): Payer: Medicaid Other | Admitting: Occupational Therapy

## 2017-08-01 ENCOUNTER — Inpatient Hospital Stay (HOSPITAL_COMMUNITY): Payer: Medicaid Other | Admitting: Physical Therapy

## 2017-08-01 LAB — COMPREHENSIVE METABOLIC PANEL
ALT: 195 U/L — AB (ref 17–63)
ANION GAP: 13 (ref 5–15)
AST: 78 U/L — ABNORMAL HIGH (ref 15–41)
Albumin: 3.4 g/dL — ABNORMAL LOW (ref 3.5–5.0)
Alkaline Phosphatase: 95 U/L (ref 38–126)
BUN: 41 mg/dL — ABNORMAL HIGH (ref 6–20)
CHLORIDE: 102 mmol/L (ref 101–111)
CO2: 21 mmol/L — ABNORMAL LOW (ref 22–32)
Calcium: 8.8 mg/dL — ABNORMAL LOW (ref 8.9–10.3)
Creatinine, Ser: 1.97 mg/dL — ABNORMAL HIGH (ref 0.61–1.24)
GFR calc non Af Amer: 37 mL/min — ABNORMAL LOW (ref >60)
GFR, EST AFRICAN AMERICAN: 43 mL/min — AB (ref >60)
Glucose, Bld: 127 mg/dL — ABNORMAL HIGH (ref 65–99)
POTASSIUM: 4.1 mmol/L (ref 3.5–5.1)
Sodium: 136 mmol/L (ref 135–145)
Total Bilirubin: 0.6 mg/dL (ref 0.3–1.2)
Total Protein: 7.5 g/dL (ref 6.5–8.1)

## 2017-08-01 LAB — CBC WITH DIFFERENTIAL/PLATELET
Basophils Absolute: 0 10*3/uL (ref 0.0–0.1)
Basophils Relative: 0 %
EOS ABS: 0.2 10*3/uL (ref 0.0–0.7)
EOS PCT: 2 %
HCT: 45 % (ref 39.0–52.0)
Hemoglobin: 14.5 g/dL (ref 13.0–17.0)
LYMPHS ABS: 3.6 10*3/uL (ref 0.7–4.0)
Lymphocytes Relative: 29 %
MCH: 29.8 pg (ref 26.0–34.0)
MCHC: 32.2 g/dL (ref 30.0–36.0)
MCV: 92.4 fL (ref 78.0–100.0)
Monocytes Absolute: 0.6 10*3/uL (ref 0.1–1.0)
Monocytes Relative: 5 %
Neutro Abs: 7.8 10*3/uL — ABNORMAL HIGH (ref 1.7–7.7)
Neutrophils Relative %: 64 %
PLATELETS: 443 10*3/uL — AB (ref 150–400)
RBC: 4.87 MIL/uL (ref 4.22–5.81)
RDW: 14.2 % (ref 11.5–15.5)
WBC: 12.3 10*3/uL — AB (ref 4.0–10.5)

## 2017-08-01 MED ORDER — AMITRIPTYLINE HCL 25 MG PO TABS
25.0000 mg | ORAL_TABLET | ORAL | Status: DC
  Administered 2017-08-01 – 2017-08-05 (×5): 25 mg via ORAL
  Filled 2017-08-01 (×5): qty 1

## 2017-08-01 NOTE — Progress Notes (Signed)
Occupational Therapy Session Note  Patient Details  Name: Trevor Nunez MRN: [id number] Date of Birth: Feb 02, 1966  Today's Date: 08/01/2017 OT Individual Time: 1000-1028 OT Individual Time Calculation (min): 28 min   Short Term Goals: Week 1:  OT Short Term Goal 1 (Week 1): Pt will be able to tolerate ambulation from bed to his toilet with LRAD with S. OT Short Term Goal 2 (Week 1): Pt will be able to don pants with S. OT Short Term Goal 3 (Week 1): Pt will be able to don socks with min A and/or sock aide.  Skilled Therapeutic Interventions/Progress Updates:    Pt greeted seated in w/c. B LE/R UE pain persists with nursing staff already aware. Worked on gentle AROM R UE in all planes with instruction to prevent ROM loss. During this time, we discussed holistic pain mgt strategies to implement in conjunction with prescribed medication at CIR. He was receptive to using music therapeutically, and OT set this up for him on room computer. Pt was left in w/c with phlebotomist and all needs within reach. Listening to favorite music.  Marland Kitchen.  Therapy Documentation Precautions:  Precautions Precautions: Fall Precaution Comments: pt never had back precautions, was only using them for pain management Restrictions Weight Bearing Restrictions: No Pain: Pain Assessment Pain Scale: 0-10 Pain Score: 9  Pain Type: Acute pain Pain Location: Arm(and leg ) Pain Orientation: Right Pain Descriptors / Indicators: Aching Pain Intervention(s): Medication (See eMAR) Multiple Pain Sites: Yes ADL:   See Function Navigator for Current Functional Status.   Therapy/Group: Individual Therapy  Trevor Nunez A Lounell Schumacher 08/01/2017, 10:40 AM

## 2017-08-01 NOTE — Progress Notes (Signed)
Physical Therapy Note  Patient Details  Name: [name] MRN: [id number] Date of Birth: [birth year] Today's Date: 08/01/2017    Time: 815-910 55 minutes  1:1 pt c/o pain in Rt UE and Lt LE, states he rec'd pain meds prior to session.  Pt agreeable to vestibular eval.  Pt with dizziness with horizontal head turns, supine <> sit, rolling both directions.  No dizziness with vertical head turns, saccades and tracking.  Pt unable to keep eyes open during assessment when dizzy, unable to assess for nystagmus.  Attempted dix hallpike to assess A/P canals, pt able to tolerate with increased dizziness both directions, unable to keep eyes open so unable to assess nystagmus.  Pt states he has trouble with lying supine due to breathing, PT attempted to instruct pt in Brandt-Daroff exercises as these only require side lying. Pt able to tolerate lying on Rt side but unable to tolerate lying on Lt.  Pt then states he is unable to tolerate any further vestibular assessment/treatment.   Pt given HEP for gaze stabilization with horizontal head turns.  Pt performs gait with PFRW x 20' with min guard. Pt with increased dizziness during gait.  BP sitting 144/100, BP standing 141/108.  Pt left in room in w/c with needs at hand, RN present.   Finbar Nippert 08/01/2017, 9:12 AM

## 2017-08-01 NOTE — Progress Notes (Signed)
PHYSICAL MEDICINE & REHABILITATION     PROGRESS NOTE    Subjective/Complaints: Ongoing LLE pain.   ROS: Patient denies fever, rash, sore throat, blurred vision, nausea, vomiting, diarrhea, cough, shortness of breath or chest pain, back pain, headache, or mood change.    Objective: Vital Signs: Blood pressure 119/80, pulse 79, temperature 98.2 F (36.8 C), temperature source Oral, resp. rate 18, height 6' (1.829 m), weight 135.6 kg (298 lb 15.1 oz), SpO2 93 %. No results found. Recent Labs    08/01/17 1024  WBC 12.3*  HGB 14.5  HCT 45.0  PLT 443*   Recent Labs    08/01/17 1024  NA 136  K 4.1  CL 102  GLUCOSE 127*  BUN 41*  CREATININE 1.97*  CALCIUM 8.8*   CBG (last 3)  No results for input(s): GLUCAP in the last 72 hours.  Wt Readings from Last 3 Encounters:  08/01/17 135.6 kg (298 lb 15.1 oz)  07/27/17 134.2 kg (295 lb 13.7 oz)  07/16/17 127 kg (280 lb)    Physical Exam:  Constitutional: No distress . Vital signs reviewed. HEENT: EOMI, oral membranes moist Cardiovascular: RRR without murmur. No JVD    Respiratory: CTA Bilaterally without wheezes or rales. Normal effort    GI: BS +, non-tender, non-distended  Musculoskeletal: RUE edema and TTP, in splint. Right knee tender to palpation and PROM Neurological: He is alert.  Motor: LUE: 5/5 proximal to distal LLE: HF 4/5, KE 5/5, ADF 5/5 RLE: HF 4/5, KE 4/5, ADF 5/5 RUE: shoulder abduction 4/5, elbow dressed, hand grip 4+/5 (stable) Skin: RUE with dressing/splint C/D/I Psychiatric: somewhat flat   Assessment/Plan: 1. Functional deficits secondary to debility from multiple medical issues including gout which require 3+ hours per day of interdisciplinary therapy in a comprehensive inpatient rehab setting. Physiatrist is providing close team supervision and 24 hour management of active medical problems listed below. Physiatrist and rehab team continue to assess barriers to discharge/monitor patient  progress toward functional and medical goals.  Function:  Bathing Bathing position   Position: Wheelchair/chair at sink  Bathing parts Body parts bathed by patient: Right arm, Left arm, Chest, Abdomen, Front perineal area, Buttocks, Right upper leg, Left upper leg Body parts bathed by helper: Right lower leg, Left lower leg, Back  Bathing assist        Upper Body Dressing/Undressing Upper body dressing   What is the patient wearing?: Pull over shirt/dress     Pull over shirt/dress - Perfomed by patient: Thread/unthread right sleeve, Thread/unthread left sleeve, Put head through opening, Pull shirt over trunk          Upper body assist Assist Level: More than reasonable time      Lower Body Dressing/Undressing Lower body dressing   What is the patient wearing?: Underwear, Pants, Ted Hose, Non-skid slipper socks Underwear - Performed by patient: Thread/unthread left underwear leg, Pull underwear up/down Underwear - Performed by helper: Thread/unthread right underwear leg Pants- Performed by patient: Pull pants up/down Pants- Performed by helper: Thread/unthread right pants leg, Thread/unthread left pants leg   Non-skid slipper socks- Performed by helper: Don/doff right sock, Don/doff left sock               TED Hose - Performed by helper: Don/doff right TED hose, Don/doff left TED hose  Lower body assist        Toileting Toileting   Toileting steps completed by patient: Adjust clothing prior to toileting, Performs perineal hygiene, Adjust clothing after  toileting      Toileting assist Assist level: Touching or steadying assistance (Pt.75%)   Transfers Chair/bed transfer   Chair/bed transfer method: Stand pivot Chair/bed transfer assist level: Touching or steadying assistance (Pt > 75%) Chair/bed transfer assistive device: Armrests, Other(hemiwalker)     Locomotion Ambulation     Max distance: 20' Assist level: Touching or steadying assistance (Pt > 75%)    Wheelchair   Type: Manual Max wheelchair distance: 60 Assist Level: Supervision or verbal cues  Cognition Comprehension Comprehension assist level: Follows complex conversation/direction with no assist  Expression Expression assist level: Expresses complex ideas: With no assist  Social Interaction Social Interaction assist level: Interacts appropriately with others - No medications needed.  Problem Solving Problem solving assist level: Solves complex problems: Recognizes & self-corrects  Memory Memory assist level: Complete Independence: No helper   Medical Problem List and Plan: 1.  Decreased functional mobility secondary to bilateral lower extremity weakness and distal sensory loss, edema and decreased sensation.  Plan follow-up outpatient with neurology services for paraspinal EMG and possible paraspinal muscle biopsy   -Continue CIR 2.  DVT Prophylaxis/Anticoagulation: Subcutaneous heparin.  Venous Doppler studies negative 3. Pain Management: Elavil 25 mg nightly   -ROM and local care to right knee/elbow   Gabapentin 200 3 times a day started on 4/14  -prednisone as below 4. Mood: Provide emotional support 5. Neuropsych: This patient is capable of making decisions on his own behalf. 6. Skin/Wound Care: Routine skin checks 7. Fluids/Electrolytes/Nutrition:     -encourage PO, appropriate dietary intake 8.  Acute kidney injury on chronic kidney disease stage III.  Continue Demadex 60 mg daily and Aldactone 25 mg daily.  Follow-up renal services   Creatinine  1.97 today   Continue to monitor 9.  Gout.  Uric acid level 13.4.  Right knee joint aspiration 07/25/2017.  Prednisone as directed 07/26/2017 x 5 days completed   Some improvement 10.  Hypertension.  Coreg 12.5 mg twice daily Vitals:   07/31/17 1427 08/01/17 0609  BP: 130/79 119/80  Pulse: 88 79  Resp: 17 18  Temp: 98.1 F (36.7 C) 98.2 F (36.8 C)  SpO2: 98% 93%   Relatively controlled on 4/15 11.  Right arm injury after  fall.  Conservative care per orthopedic services and fitted with a posterior elbow splint.  Weightbearing as tolerated 12.  OSA.  Recently diagnosed in February.  Still awaiting CPAP machine at home.  Patient has been refusing. 13.  Diastolic congestive heart failure.  Monitor for any signs of fluid overload Filed Weights   07/30/17 0500 07/31/17 0545 08/01/17 0609  Weight: (!) 137.5 kg (303 lb 2.1 oz) 135.2 kg (298 lb 1 oz) 135.6 kg (298 lb 15.1 oz)    ? Improving control 14.  Prediabetes with Medical noncompliance.    Provide counseling  -intermittent hyperglycemia 15. Transaminitis  LFTs elevated since 4/4   Some further elevation today. Will recheck tomorrow.   -consider hepatitis panel if ongoing elevation 16. Leukocytosis   WBC 12.3 today, trending down  Afebrile       LOS (Days) 5 A FACE TO FACE EVALUATION WAS PERFORMED  Ranelle OysterZachary T Swartz, MD 08/01/2017 12:54 PM

## 2017-08-01 NOTE — Progress Notes (Signed)
Physical Therapy Session Note  Patient Details  Name: Trevor Nunez MRN: [id number] Date of Birth: [birth year]  Today's Date: 08/01/2017 PT Individual Time: 1110-1155 PT Individual Time Calculation (min): 45 min   Short Term Goals: Week 1:  PT Short Term Goal 1 (Week 1): Pt will transfer with supervision and LRAD PT Short Term Goal 2 (Week 1): Pt will ambulate 5650' with LRAD and min guard PT Short Term Goal 3 (Week 1): Pt will initiate stair training with PT for strengthening and balance   Skilled Therapeutic Interventions/Progress Updates:    Focused on endurance and activity tolerance, UE and LE strengthening exercises, and gait training with PFRW. UE ergometer on level 1 random program x 3 min forward and 3 min backwards for UE strengthening and increasing ROM on RUE for active assisted movement. Seated LE therex including heel/toe raises, LAQ with 3 sec hold, and marches x 10 reps each BLE to tolerance. Pt able to gait x 20' and x 10' with PFRW with close supervision demonstrating shuffled gait pattern and very slow cadence. Pt limited overall by pain and reports some dizziness after second bout of gait, finishing session with seated therex. Reports resolving symptoms after about 30 seconds of seated rest.   Therapy Documentation Precautions:  Precautions Precautions: Fall Precaution Comments: pt never had back precautions, was only using them for pain management Restrictions Weight Bearing Restrictions: No    Pain: Reports premedicated for BLE and RUE pain. 5/10. States that as soon as medication wears off, pain increases.   See Function Navigator for Current Functional Status.   Therapy/Group: Individual Therapy  Karolee StampsGray, Sada Mazzoni Darrol PokeBrescia  Suyash Amory B. Melessia Kaus, PT, DPT  08/01/2017, 12:00 PM

## 2017-08-01 NOTE — Progress Notes (Signed)
Patient has declined the CPAP stating that he has not received his from home health.

## 2017-08-01 NOTE — Progress Notes (Signed)
Occupational Therapy Session Note  Patient Details  Name: Trevor Nunez MRN: [id number] Date of Birth: Nov 08, 1965  Today's Date: 08/01/2017 OT Individual Time: [id number] OT Individual Time Calculation (min): 54 min    Short Term Goals: Week 1:  OT Short Term Goal 1 (Week 1): Pt will be able to tolerate ambulation from bed to his toilet with LRAD with S. OT Short Term Goal 2 (Week 1): Pt will be able to don pants with S. OT Short Term Goal 3 (Week 1): Pt will be able to don socks with min A and/or sock aide.  Skilled Therapeutic Interventions/Progress Updates:    Pt resting in bed upon arrival.  Pt declined bathing/dressing this morning.  Pt continues to c/o dizzyness with all transitional movements and occasionally while seated.  OT intervention with focus on bed mobility, functional transfers, sit<>stand, standing balance, functional amb with hemi-walker, and safety awareness to increase independence with BADLs.  Pt required min A for sit<>stand from EOB and requested steady A for functional transfer secondary to dizzyness.  Pt remained in w/c with all needs within reach.   Therapy Documentation Precautions:  Precautions Precautions: Fall Precaution Comments: pt never had back precautions, was only using them for pain management Restrictions Weight Bearing Restrictions: No  Pain:  8/10 in BLE and RUE; RN aware  See Function Navigator for Current Functional Status.   Therapy/Group: Individual Therapy  Rich BraveLanier, Marzella Miracle Chappell 08/01/2017, 7:56 AM

## 2017-08-02 ENCOUNTER — Inpatient Hospital Stay (HOSPITAL_COMMUNITY): Payer: Medicaid Other | Admitting: Physical Therapy

## 2017-08-02 ENCOUNTER — Inpatient Hospital Stay (HOSPITAL_COMMUNITY): Payer: Medicaid Other

## 2017-08-02 LAB — HEPATIC FUNCTION PANEL
ALBUMIN: 3.1 g/dL — AB (ref 3.5–5.0)
ALT: 147 U/L — ABNORMAL HIGH (ref 17–63)
AST: 39 U/L (ref 15–41)
Alkaline Phosphatase: 89 U/L (ref 38–126)
Bilirubin, Direct: <0.1 mg/dL — ABNORMAL LOW (ref 0.1–0.5)
TOTAL PROTEIN: 6.8 g/dL (ref 6.5–8.1)
Total Bilirubin: 0.5 mg/dL (ref 0.3–1.2)

## 2017-08-02 MED ORDER — ALPRAZOLAM 0.25 MG PO TABS
0.2500 mg | ORAL_TABLET | Freq: Three times a day (TID) | ORAL | Status: DC | PRN
  Administered 2017-08-02 – 2017-08-05 (×3): 0.25 mg via ORAL
  Filled 2017-08-02 (×3): qty 1

## 2017-08-02 MED ORDER — PREGABALIN 75 MG PO CAPS
75.0000 mg | ORAL_CAPSULE | Freq: Two times a day (BID) | ORAL | Status: DC
  Administered 2017-08-02 – 2017-08-06 (×8): 75 mg via ORAL
  Filled 2017-08-02 (×8): qty 1

## 2017-08-02 NOTE — Care Management Note (Signed)
[facility] Individual Statement of Services  Patient Name:  Trevor Nunez  Date:  08/02/2017  [facility] the [facility].  Our goal is to provide you with an individualized program based on your diagnosis and situation, designed to meet your specific needs.  With this comprehensive rehabilitation program, you will be expected to participate in at least 3 hours of rehabilitation therapies Monday-Friday, with modified therapy programming on the weekends.  Your rehabilitation program will include the following services:  Physical Therapy (PT), Occupational Therapy (OT), 24 hour per day rehabilitation nursing, Therapeutic Recreaction (TR), Neuropsychology, Case Management (Social Worker), Rehabilitation Medicine, Nutrition Services and Pharmacy Services  Weekly team conferences will be held on Tuesdays to discuss your progress.  Your Social Worker will talk with you frequently to get your input and to update you on team discussions.  Team conferences with you and your family in attendance may also be held.  Expected length of stay: 10-12 days    Overall anticipated outcome: modified independent  Depending on your progress and recovery, your program may change. Your Social Worker will coordinate services and will keep you informed of any changes. Your Social Worker's name and contact numbers are listed  below.  The following services may also be recommended but are not provided by the [facility]:   Driving Evaluations  Home Health Rehabiltiation Services  Outpatient Rehabilitation Services  Vocational Rehabilitation   Arrangements will be made to provide these services after discharge if needed.  Arrangements include referral to agencies that provide these services.  Your insurance has been verified to be:  Medicaid Your primary doctor is:  Dr. Marcello FennelHande  Pertinent information will be shared with your doctor and your insurance  company.  Social Worker:  MortonLucy Elyon Zoll, [place] [phone] or [phone]   Information discussed with and copy given to patient by: Amada JupiterHOYLE, Kegan Shepardson, 08/02/2017, 12:54 PM

## 2017-08-02 NOTE — Progress Notes (Addendum)
PHYSICAL MEDICINE & REHABILITATION     PROGRESS NOTE    Subjective/Complaints: Patient states that he does not feel well today.  I asked him why he stated he had some anxiety when he awoke this morning.  Some of that he attributes to pain  ROS: Limited due to cognitive/behavioral   Objective: Vital Signs: Blood pressure (!) 135/118, pulse (!) 130, temperature 97.8 F (36.6 C), temperature source Oral, resp. rate (!) 22, height 6' (1.829 m), weight (!) 137.3 kg (302 lb 11.1 oz), SpO2 95 %. No results found. Recent Labs    08/01/17 1024  WBC 12.3*  HGB 14.5  HCT 45.0  PLT 443*   Recent Labs    08/01/17 1024  NA 136  K 4.1  CL 102  GLUCOSE 127*  BUN 41*  CREATININE 1.97*  CALCIUM 8.8*   CBG (last 3)  No results for input(s): GLUCAP in the last 72 hours.  Wt Readings from Last 3 Encounters:  08/02/17 (!) 137.3 kg (302 lb 11.1 oz)  07/27/17 134.2 kg (295 lb 13.7 oz)  07/16/17 127 kg (280 lb)    Physical Exam:  Constitutional: No distress . Vital signs reviewed. Obese HEENT: EOMI, oral membranes moist Cardiovascular: RRR without murmur. No JVD    Respiratory: CTA Bilaterally without wheezes or rales. Normal effort    GI: BS +, non-tender, non-distended  Musculoskeletal: RUE  in splint. Knees minimally tender to palpation Neurological: He is alert.  Motor: LUE: 5/5 proximal to distal LLE: HF 4/5, KE 5/5, ADF 5/5 RLE: HF 4/5, KE 4/5, ADF 5/5 RUE: shoulder abduction 4/5, elbow dressed, hand grip 4+/5 (motor exam unchanged) Skin: RUE with dressing/splint C/D/I Psychiatric: Remains flat and sometimes irritable   Assessment/Plan: 1. Functional deficits secondary to debility from multiple medical issues including gout which require 3+ hours per day of interdisciplinary therapy in a comprehensive inpatient rehab setting. Physiatrist is providing close team supervision and 24 hour management of active medical problems listed below. Physiatrist and rehab  team continue to assess barriers to discharge/monitor patient progress toward functional and medical goals.  Function:  Bathing Bathing position   Position: Wheelchair/chair at sink  Bathing parts Body parts bathed by patient: Right arm, Left arm, Chest, Abdomen, Front perineal area, Buttocks, Right upper leg, Left upper leg Body parts bathed by helper: Right lower leg, Left lower leg, Back  Bathing assist        Upper Body Dressing/Undressing Upper body dressing   What is the patient wearing?: Pull over shirt/dress     Pull over shirt/dress - Perfomed by patient: Thread/unthread right sleeve, Thread/unthread left sleeve, Put head through opening, Pull shirt over trunk          Upper body assist Assist Level: More than reasonable time      Lower Body Dressing/Undressing Lower body dressing   What is the patient wearing?: Underwear, Pants, Ted Hose, Non-skid slipper socks Underwear - Performed by patient: Thread/unthread left underwear leg, Pull underwear up/down Underwear - Performed by helper: Thread/unthread right underwear leg Pants- Performed by patient: Pull pants up/down Pants- Performed by helper: Thread/unthread right pants leg, Thread/unthread left pants leg   Non-skid slipper socks- Performed by helper: Don/doff right sock, Don/doff left sock               TED Hose - Performed by helper: Don/doff right TED hose, Don/doff left TED hose  Lower body assist Assist for lower body dressing: (moderate assist)  Toileting Toileting   Toileting steps completed by patient: Adjust clothing prior to toileting, Performs perineal hygiene Toileting steps completed by helper: Adjust clothing after toileting    Toileting assist Assist level: Touching or steadying assistance (Pt.75%)   Transfers Chair/bed transfer   Chair/bed transfer method: Stand pivot Chair/bed transfer assist level: Touching or steadying assistance (Pt > 75%) Chair/bed transfer assistive device:  Armrests, Other(hemiwalker)     Locomotion Ambulation     Max distance: 20' Assist level: Touching or steadying assistance (Pt > 75%)   Wheelchair   Type: Manual Max wheelchair distance: 60 Assist Level: Supervision or verbal cues  Cognition Comprehension Comprehension assist level: Follows complex conversation/direction with no assist  Expression Expression assist level: Expresses complex ideas: With no assist  Social Interaction Social Interaction assist level: Interacts appropriately with others - No medications needed.  Problem Solving Problem solving assist level: Solves complex problems: Recognizes & self-corrects  Memory Memory assist level: Complete Independence: No helper   Medical Problem List and Plan: 1.  Decreased functional mobility secondary to bilateral lower extremity weakness and distal sensory loss, edema and decreased sensation.  Plan follow-up outpatient with neurology services for paraspinal EMG and possible paraspinal muscle biopsy   -Continue CIR, team conf today 2.  DVT Prophylaxis/Anticoagulation: Subcutaneous heparin.  Venous Doppler studies negative 3. Pain Management: Elavil 25 mg nightly   -ROM and local care to right knee/elbow   Gabapentin changed to Lyrica 50 mg twice daily.  Will adjust slightly to 75 mg twice daily today    4. Mood: Provide emotional support 5. Neuropsych: This patient is capable of making decisions on his own behalf. 6. Skin/Wound Care: Routine skin checks 7. Fluids/Electrolytes/Nutrition:     -encourage PO, appropriate dietary intake 8.  Acute kidney injury on chronic kidney disease stage III.  Continue Demadex 60 mg daily and Aldactone 25 mg daily.  Follow-up renal services   Creatinine  1.97 today   Continue to monitor 9.  Gout.  Uric acid level 13.4.  Right knee joint aspiration 07/25/2017.  Prednisone as directed 07/26/2017 x 5 days completed   Some improvement 10.  Hypertension.  Coreg 12.5 mg twice daily Vitals:    08/02/17 0537 08/02/17 0800  BP: (!) 147/97 (!) 135/118  Pulse: 86 (!) 130  Resp: 17 (!) 22  Temp: 97.8 F (36.6 C)   SpO2: 95% 95%   Poor control this morning.  May be due to pain.  Observe and treat if persistent 11.  Right arm injury after fall.  Conservative care per orthopedic services and fitted with a posterior elbow splint.  Weightbearing as tolerated 12.  OSA.  Recently diagnosed in February.  Still awaiting CPAP machine at home.  Patient has been refusing. 13.  Diastolic congestive heart failure.  Monitor for any signs of fluid overload Filed Weights   07/31/17 0545 08/01/17 0609 08/02/17 0437  Weight: 135.2 kg (298 lb 1 oz) 135.6 kg (298 lb 15.1 oz) (!) 137.3 kg (302 lb 11.1 oz)    Weights generally stable 14.  Prediabetes with Medical noncompliance.    Provide counseling  -intermittent hyperglycemia 15. Transaminitis  LFTs elevated since 4/4   After increase yesterday and liver function tests are improved somewhat today.  -Recheck later this week.   -consider hepatitis panel if if LFTs do not trend further downward 16. Leukocytosis   WBC 12.3 , trending down  Afebrile       LOS (Days) 6 A FACE TO FACE EVALUATION WAS PERFORMED  Ranelle OysterZachary T Swartz, MD 08/02/2017 8:48 AM

## 2017-08-02 NOTE — Progress Notes (Signed)
Physical Therapy Session Note  Patient Details  Name: Trevor Nunez MRN: [id number] Date of Birth: 1965-10-26  Today's Date: 08/02/2017 PT Individual Time: 1530-1630 PT Individual Time Calculation (min): 60 min   Short Term Goals: Week 1:  PT Short Term Goal 1 (Week 1): Pt will transfer with supervision and LRAD PT Short Term Goal 2 (Week 1): Pt will ambulate 6150' with LRAD and min guard PT Short Term Goal 3 (Week 1): Pt will initiate stair training with PT for strengthening and balance    Skilled Therapeutic Interventions/Progress Updates: Pt received seated in w/c, c/o pain as below and agreeable to treatment. Pt perseverative with nursing staff and with therapist to take a shower. RN applied covering to RUE. Gait in/out of bathroom with HW and min guard. Standing balance while doffing clothing min guard with use of grab bar. Performed bathing with modA; required therapist assist to clean and dry off back, buttocks, lower legs. SetupA to obtain clothing; performs dressing with S with increased time to thread LEs into underwear and pants utilizing multiple strategies before successful. TEDs and non-skid socks donned totalA. Gait x35' with HW and min guard; limited by dizziness at end of trial. Discussed HW with pt, that it is usually preferred to use on his L side like a cane as opposed to in front of his body, however [facility] and he feels more stable with HW in front of him. HW elevated one level to facilitate upright trunk alignment and reduce strain on low back during gait; moderately improved gait dynamics second trial. Second trial x72' with min guard; pt encouraged to participate in goal setting for trial, and pt surpassed goal by 15'. Returned to room totalA. Remained seated in w/c at end of session, all needs in reach.      Therapy Documentation Precautions:  Precautions Precautions: Fall Precaution Comments: pt never had back precautions, was only using them for  pain management Restrictions Weight Bearing Restrictions: No General: PT Amount of Missed Time (min): 45 Minutes PT Missed Treatment Reason: Pain;Patient unwilling to participate Pain: Pain Assessment Pain Scale: 0-10 Pain Score: 9  Pain Type: Acute pain Pain Location: Arm Pain Orientation: Right Pain Radiating Towards: leg Pain Descriptors / Indicators: Aching Pain Frequency: Intermittent Pain Onset: On-going Patients Stated Pain Goal: 4 Pain Intervention(s): Medication (See eMAR) Multiple Pain Sites: Yes  See Function Navigator for Current Functional Status.   Therapy/Group: Individual Therapy  Vista Lawmanlizabeth J Tygielski 08/02/2017, 4:33 PM

## 2017-08-02 NOTE — Progress Notes (Signed)
Physical Therapy Session Note  Patient Details  Name: Trevor Nunez MRN: [id number] Date of Birth: 02-Mar-1966  Today's Date: 08/02/2017 PT Individual Time: 1415-1455 PT Individual Time Calculation (min): 40 min  and Today's Date: 08/02/2017 PT Missed Time: 26 Minutes Missed Time Reason: Pain;Patient unwilling to participate  Short Term Goals: Week 1:  PT Short Term Goal 1 (Week 1): Pt will transfer with supervision and LRAD PT Short Term Goal 2 (Week 1): Pt will ambulate 60' with LRAD and min guard PT Short Term Goal 3 (Week 1): Pt will initiate stair training with PT for strengthening and balance   Skilled Therapeutic Interventions/Progress Updates:   Session 1:  Pt declined OOB activity 2/2 increased LE pain and overall high level of anxiety this morning regarding his prognosis. RN present taking vital signs, HR in 150s. Will follow up later this date to make up time if appropriate.   Session 2:  Pt in w/c and states he feels much better than this morning, states he felt "out-on-it". Agreeable to therapy, pain as detailed below. Worked on overall endurance and LE strengthening this session. Pt self-propelled w/c to day room w/ increased time using all extremities to compensate for decreased functional use of LUE. Brief rest break and performed seated BLE strengthening exercises including LAQs 2x20, hip marches 2x20, and heel raises 2x10. Pt reports some increased pain w/ active use of LEs, however says "it's the good kind of pain and soreness". Attempted to self-propel back to room, however increased pain and fatigue. Returned to room total assist and ended session in w/c, call bell within reach and all needs met.   Therapy Documentation Precautions:  Precautions Precautions: Fall Precaution Comments: pt never had back precautions, was only using them for pain management Restrictions Weight Bearing Restrictions: No General: PT Amount of Missed Time (min): 45 Minutes PT Missed Treatment  Reason: Pain;Patient unwilling to participate Vital Signs: Therapy Vitals Temp: 98 F (36.7 C) Temp Source: Oral Pulse Rate: 99 Resp: 18 BP: 114/90 Patient Position (if appropriate): Sitting Oxygen Therapy SpO2: 98 % O2 Device: Room Air Pain: Pain Assessment Pain Scale: 0-10 Pain Score: 9  Pain Type: Acute pain Pain Location: Arm Pain Orientation: Right Pain Radiating Towards: leg Pain Descriptors / Indicators: Aching Pain Frequency: Intermittent Pain Onset: On-going Patients Stated Pain Goal: 4 Pain Intervention(s): Medication (See eMAR) Multiple Pain Sites: Yes  See Function Navigator for Current Functional Status.   Therapy/Group: Individual Therapy  Trevor Nunez 08/02/2017, 4:10 PM

## 2017-08-02 NOTE — Progress Notes (Signed)
Occupational Therapy Session Note  Patient Details  Name: Meziah Blasingame MRN: [id number] Date of Birth: 09-21-1965  Today's Date: 08/02/2017 OT Individual Time: 4497-5300 OT Individual Time Calculation (min): 59 min    Short Term Goals: Week 1:  OT Short Term Goal 1 (Week 1): Pt will be able to tolerate ambulation from bed to his toilet with LRAD with S. OT Short Term Goal 2 (Week 1): Pt will be able to don pants with S. OT Short Term Goal 3 (Week 1): Pt will be able to don socks with min A and/or sock aide.  Skilled Therapeutic Interventions/Progress Updates:    Pt received supine in bed, with c/o fatigue and feeling unwell. Care coordination completed with RN and PA re pt condition and appropriateness for therapy. Both approved session pending pt consent. Pt willing to get OOB and participate in therapy with vc for encouragement. Pt completed bed mobility with vc for activity pacing and breathing techniques to reduce dizziness during transition. Pt sat EOB for extended time to reduce dizziness. Pt transferred to w/c with hemi-walker and SBA. Pt completed oral care at sink with mod I. Pt then completed 10 min on nu-step in order to facilitate reciprocal movement and B LE strength required for ADL transfers and functional mobility. Skilled monitoring of vitals throughout session, per PA request, with BP elevated but RN approved for session. Pt reports nu-step making him "feel a lot better". Pt returned to room and all needs met. RN alerted to pt saying he "will get in the shower by himself" despite multiple attempts to tell pt to call for assistance.   Therapy Documentation Precautions:  Precautions Precautions: Fall Precaution Comments: pt never had back precautions, was only using them for pain management Restrictions Weight Bearing Restrictions: No Vital Signs: Therapy Vitals Temp: 97.8 F (36.6 C) Temp Source: Oral Pulse Rate: 92 Resp: 18 BP: (!) 125/102 Patient Position (if  appropriate): Sitting Oxygen Therapy SpO2: 98 % O2 Device: Room Air Pain: Pain Assessment Pain Scale: 0-10 Pain Score: 10-Worst pain ever Faces Pain Scale: Hurts little more Pain Type: Acute pain Pain Location: Arm Pain Orientation: Right Pain Descriptors / Indicators: Aching Pain Frequency: Intermittent Pain Onset: On-going Pain Intervention(s): Repositioned  See Function Navigator for Current Functional Status.   Therapy/Group: Individual Therapy  Curtis Sites 08/02/2017, 11:31 AM

## 2017-08-03 ENCOUNTER — Inpatient Hospital Stay (HOSPITAL_COMMUNITY): Payer: Medicaid Other | Admitting: Physical Therapy

## 2017-08-03 ENCOUNTER — Inpatient Hospital Stay (HOSPITAL_COMMUNITY): Payer: Medicaid Other

## 2017-08-03 NOTE — Progress Notes (Signed)
[place] PHYSICAL MEDICINE & REHABILITATION     PROGRESS NOTE    Subjective/Complaints: Pt did a little better after anxiety yesterday. Slept ok last night.  ROS: Patient denies fever, rash, sore throat, blurred vision, nausea, vomiting, diarrhea, cough, shortness of breath or chest pain, joint or back pain, headache, or mood change.   Objective: Vital Signs: Blood pressure 126/88, pulse 80, temperature 97.9 F (36.6 C), temperature source Oral, resp. rate 18, height 6' (1.829 m), weight 134 kg (295 lb 6.7 oz), SpO2 98 %. No results found. Recent Labs    08/01/17 1024  WBC 12.3*  HGB 14.5  HCT 45.0  PLT 443*   Recent Labs    08/01/17 1024  NA 136  K 4.1  CL 102  GLUCOSE 127*  BUN 41*  CREATININE 1.97*  CALCIUM 8.8*   CBG (last 3)  No results for input(s): GLUCAP in the last 72 hours.  Wt Readings from Last 3 Encounters:  08/03/17 134 kg (295 lb 6.7 oz)  07/27/17 134.2 kg (295 lb 13.7 oz)  07/16/17 127 kg (280 lb)    Physical Exam:  Constitutional: No distress . Vital signs reviewed. HEENT: EOMI, oral membranes moist Cardiovascular: RRR without murmur. No JVD    Respiratory: CTA Bilaterally without wheezes or rales. Normal effort    GI: BS +, non-tender, non-distended   Musculoskeletal: RUE  in splint. Knees minimally tender to palpation Neurological: He is alert.  Motor: LUE: 5/5 proximal to distal LLE: HF 4/5, KE 5/5, ADF 5/5 RLE: HF 4/5, KE 4/5, ADF 5/5 RUE: shoulder abduction 4/5, elbow dressed, hand grip 4+/5 (motor exam unchanged) Skin: RUE with dressing/splint C/D/I Psychiatric: Remains flat and sometimes irritable   Assessment/Plan: 1. Functional deficits secondary to debility from multiple medical issues including gout which require 3+ hours per day of interdisciplinary therapy in a comprehensive inpatient rehab setting. Physiatrist is providing close team supervision and 24 hour management of active medical problems listed below. Physiatrist  and rehab team continue to assess barriers to discharge/monitor patient progress toward functional and medical goals.  Function:  Bathing Bathing position Bathing activity did not occur: Refused Position: Systems developerhower  Bathing parts Body parts bathed by patient: Right arm, Left arm, Chest, Abdomen, Front perineal area, Right upper leg, Left upper leg Body parts bathed by helper: Right lower leg, Left lower leg, Back, Buttocks  Bathing assist Assist Level: Touching or steadying assistance(Pt > 75%)      Upper Body Dressing/Undressing Upper body dressing Upper body dressing/undressing activity did not occur: Refused What is the patient wearing?: Pull over shirt/dress     Pull over shirt/dress - Perfomed by patient: Thread/unthread right sleeve, Thread/unthread left sleeve, Put head through opening, Pull shirt over trunk          Upper body assist Assist Level: More than reasonable time, Set up   Set up : To obtain clothing/put away  Lower Body Dressing/Undressing Lower body dressing Lower body dressing/undressing activity did not occur: Refused What is the patient wearing?: Underwear, Pants, Ted Hose, Non-skid slipper socks Underwear - Performed by patient: Thread/unthread right underwear leg, Thread/unthread left underwear leg, Pull underwear up/down Underwear - Performed by helper: Thread/unthread right underwear leg Pants- Performed by patient: Thread/unthread right pants leg, Thread/unthread left pants leg, Pull pants up/down Pants- Performed by helper: Thread/unthread right pants leg, Thread/unthread left pants leg   Non-skid slipper socks- Performed by helper: Don/doff right sock, Don/doff left sock  TED Hose - Performed by helper: Don/doff right TED hose, Don/doff left TED hose  Lower body assist Assist for lower body dressing: Supervision or verbal cues      Toileting Toileting   Toileting steps completed by patient: Adjust clothing prior to  toileting Toileting steps completed by helper: Adjust clothing after toileting Toileting Assistive Devices: Other (comment)(urinal)  Toileting assist Assist level: Touching or steadying assistance (Pt.75%)   Transfers Chair/bed transfer   Chair/bed transfer method: Stand pivot Chair/bed transfer assist level: Supervision or verbal cues Chair/bed transfer assistive device: Armrests, Other(hemiwalker)     Locomotion Ambulation     Max distance: 72 Assist level: Touching or steadying assistance (Pt > 75%)   Wheelchair   Type: Manual Max wheelchair distance: 60 Assist Level: Supervision or verbal cues  Cognition Comprehension Comprehension assist level: Follows complex conversation/direction with no assist  Expression Expression assist level: Expresses complex ideas: With no assist  Social Interaction Social Interaction assist level: Interacts appropriately with others - No medications needed.  Problem Solving Problem solving assist level: Solves complex problems: Recognizes & self-corrects  Memory Memory assist level: Complete Independence: No helper   Medical Problem List and Plan: 1.  Decreased functional mobility secondary to bilateral lower extremity weakness and distal sensory loss, edema and decreased sensation.  Plan follow-up outpatient with neurology services for paraspinal EMG and possible paraspinal muscle biopsy   -Continue CIR, PT, OT 2.  DVT Prophylaxis/Anticoagulation: Subcutaneous heparin.  Venous Doppler studies negative 3. Pain Management: Elavil 25 mg nightly   -ROM and local care to right knee/elbow   -Lyrica 75 mg twice daily    4. Mood: Provide emotional support 5. Neuropsych: This patient is capable of making decisions on his own behalf.  -anxiety an issue 6. Skin/Wound Care: Routine skin checks 7. Fluids/Electrolytes/Nutrition:     -encourage PO, appropriate dietary intake 8.  Acute kidney injury on chronic kidney disease stage III.  Continue Demadex 60  mg daily and Aldactone 25 mg daily.  Follow-up renal services   Creatinine  1.97 today   Continue to monitor 9.  Gout.  Uric acid level 13.4.  Right knee joint aspiration 07/25/2017.  Prednisone as directed 07/26/2017 x 5 days completed   Sx improved 10.  Hypertension.  Coreg 12.5 mg twice daily Vitals:   08/02/17 1433 08/03/17 0519  BP: 114/90 126/88  Pulse: 99 80  Resp: 18 18  Temp: 98 F (36.7 C) 97.9 F (36.6 C)  SpO2: 98% 98%   Better control. Elevation likely due to anxiety/pain 11.  Right arm injury after fall.  Conservative care per orthopedic services and fitted with a posterior elbow splint.  Weightbearing as tolerated 12.  OSA.  Recently diagnosed in February.  Still awaiting CPAP machine at home.  Patient has been refusing. 13.  Diastolic congestive heart failure.  Monitor for any signs of fluid overload Filed Weights   08/01/17 0609 08/02/17 0437 08/03/17 0519  Weight: 135.6 kg (298 lb 15.1 oz) (!) 137.3 kg (302 lb 11.1 oz) 134 kg (295 lb 6.7 oz)    Weights generally stable 14.  Prediabetes with Medical noncompliance.    Provide counseling  -intermittent hyperglycemia 15. Transaminitis  LFTs elevated since 4/4   After increase yesterday and liver function tests are improved somewhat today.  -Recheck tomorrow  -consider hepatitis panel if if LFTs remain elevated 16. Leukocytosis   WBC 12.3 , trending down---recheck tomorrow  Afebrile       LOS (Days) 7 A FACE TO  FACE EVALUATION WAS PERFORMED  Ranelle Oyster, MD 08/03/2017 7:45 AM

## 2017-08-03 NOTE — Progress Notes (Signed)
Social Work Patient ID: Trevor Nunez, male   DOB: [birth year], 52 y.o.   MRN: [id number]   Have reviewed team conference with pt who is aware and agreeable with targeted d/c date of 4/20 and mod ind goals overall.  Explained to pt that he does not have a "qualifying diagnosis" under Medicaid guidelines for coverage of follow up therapies.  He nods and states he understands this.  Discussed needed DME which I will order.  Pt very flat and offers little verbal response.  Denies any questions or concerns.  Ophelia Sipe, LCSW

## 2017-08-03 NOTE — Progress Notes (Signed)
Patient refused CPAP at this time and for the reminder of his hospital stay. Encouraged patient to call for Respiratory if needed.

## 2017-08-03 NOTE — Progress Notes (Signed)
Physical Therapy Session Note  Patient Details  Name: Trevor Nunez MRN: [id number] Date of Birth: 1965/12/21  Today's Date: 08/03/2017 PT Individual Time: 0800-0900 PT Individual Time Calculation (min): 60 min   Short Term Goals: Week 1:  PT Short Term Goal 1 (Week 1): Pt will transfer with supervision and LRAD PT Short Term Goal 2 (Week 1): Pt will ambulate 4950' with LRAD and min guard PT Short Term Goal 3 (Week 1): Pt will initiate stair training with PT for strengthening and balance   Skilled Therapeutic Interventions/Progress Updates:    Pt semi-reclined in bed, agreeable to PT. Pt reports pain in RUE and BLE, not rated and just received pain medication from nursing. Bed mobility with Supervision with use of bedrails. Stand pivot transfer bed to w/c with hemiwalker and SBA. Pt completes ADLs at sink with setup assist to brush teeth and wash face. Ambulation x 80 ft with hemiwalker and SBA. Pt ambulates with step-to gait pattern and is easily distracted during gait, needs v/c to attend to therapy tasks. Nustep level 4 x 7 min with LUE and BLE for global strengthening and endurance. Pt left seated in w/c in room at end of session with needs in reach.  Therapy Documentation Precautions:  Precautions Precautions: Fall Precaution Comments: pt never had back precautions, was only using them for pain management Restrictions Weight Bearing Restrictions: No  See Function Navigator for Current Functional Status.   Therapy/Group: Individual Therapy  Peter Congoaylor Stepheni Cameron, PT, DPT  08/03/2017, 12:22 PM

## 2017-08-03 NOTE — Progress Notes (Signed)
Orthopedic Tech Progress Note Patient Details:  Trevor Nunez 08-Jun-1965 [id number]  Ortho Devices Type of Ortho Device: Arm sling Ortho Device/Splint Location: rue Ortho Device/Splint Interventions: Application   Post Interventions Patient Tolerated: Well Instructions Provided: Care of device   [name] 08/03/2017, 1:16 PM

## 2017-08-03 NOTE — Progress Notes (Signed)
Physical Therapy Session Note  Patient Details  Name: Trevor Nunez MRN: [id number] Date of Birth: Dec 16, 1965  Today's Date: 08/03/2017 PT Individual Time: 1430-1515 PT Individual Time Calculation (min): 45 min   Short Term Goals: Week 1:  PT Short Term Goal 1 (Week 1): Pt will transfer with supervision and LRAD PT Short Term Goal 2 (Week 1): Pt will ambulate 1750' with LRAD and min guard PT Short Term Goal 3 (Week 1): Pt will initiate stair training with PT for strengthening and balance   Skilled Therapeutic Interventions/Progress Updates:    Pt seated in w/c in room, agreeable to PT. Pt reports pain in RUE and BLE, not rated and just received pain medication from nursing. Manual w/c propulsion with BLE and LUE x 50 ft with SBA, increased time needed to complete. Ambulation x 80 ft with hemiwalker and SBA, v/c for upright posture with gait, step-to gait pattern. Seated BLE therex x 15 reps: marches, LAQ, heel/toe raises. Pt left seated in w/c in room with needs in reach.  Therapy Documentation Precautions:  Precautions Precautions: Fall Precaution Comments: pt never had back precautions, was only using them for pain management Restrictions Weight Bearing Restrictions: No  See Function Navigator for Current Functional Status.   Therapy/Group: Individual Therapy  Peter Congoaylor Dalinda Heidt, PT, DPT  08/03/2017, 3:29 PM

## 2017-08-03 NOTE — Patient Care Conference (Signed)
Inpatient RehabilitationTeam Conference and Plan of Care Update Date: 08/02/2017   Time: 2:05 PM    Patient Name: Trevor Nunez      Medical Record Number: [id number]  Date of Birth: [birth year] Sex: Male         Room/Bed: 4W08C/4W08C-01 Payor Info: Payor: MEDICAID [place] / Plan: MEDICAID [place] ACCESS / Product Type: *No Product type* /    Admitting Diagnosis: Debility  Admit Date/Time:  07/27/2017  2:43 PM Admission Comments: No comment available   Primary Diagnosis:  <principal problem not specified> Principal Problem: <principal problem not specified>  Patient Active Problem List   Diagnosis Date Noted  . Leukocytosis   . Transaminitis   . Prediabetes   . Medication noncompliance due to cognitive impairment   . Gout 07/27/2017  . Elbow pain   . Weakness of both lower extremities   . Neuropathic pain   . Stage 3 chronic kidney disease (HCC)   . Acute idiopathic gout of right knee   . OSA (obstructive sleep apnea)   . Chronic diastolic congestive heart failure (HCC)   . Medically noncompliant   . Numbness and tingling of both lower extremities   . Pain   . Bilateral leg weakness 07/20/2017  . Foot pain, right 05/07/2017  . Leg pain 01/27/2017  . Obesity 11/16/2016  . HTN (hypertension) 11/08/2016  . Chronic diastolic heart failure (HCC) 06/25/2016  . Snoring 06/25/2016  . CKD (chronic kidney disease), stage III (HCC) 06/15/2016  . Demand ischemia (HCC) 06/15/2016  . Pain and swelling of elbow   . Acute on chronic diastolic CHF (congestive heart failure) (HCC)   . H/O medication noncompliance   . Chest pain with moderate risk for cardiac etiology 10/17/2015  . Malignant hypertension 10/17/2015  . AKI (acute kidney injury) (HCC) 10/17/2015    Expected Discharge Date: Expected Discharge Date: 08/06/17  Team Members Present: Physician leading conference: Dr. Faith RogueZachary Swartz Social Worker Present: Amada JupiterLucy Anabella Capshaw, LCSW Nurse Present: Vincente PoliAwndrea Troxler, RN PT Present: Teodoro Kilaitlin  Penven-Crew, PT OT Present: Johnsie CancelAmy Lewis, OT PPS Coordinator present : Tora DuckMarie Noel, RN, CRRN     Current Status/Progress Goal Weekly Team Focus  Medical   Patient with debility related to gout and elbow injury.  Likely peripheral neuropathy as well  Improve participation and functional independence  Pain control, blood pressure management, glycemic control   Bowel/Bladder   Continent of bowel/bladder. LBM 08/01/17  Contineu to be continent with min assist   Assess bowe/bladder function q shift and as needed   Swallow/Nutrition/ Hydration             ADL's   supervision/steady A overall; ongoing dizzyness impacts mobility and safety  mod I overall  safety awareness, functional transfers/mobility, standing balance, education, discharge planning   Mobility   min to close supervision for transfers and short distance gait; limited by pain in BLE and c/o dizziness (vestib eval inconclusive)  mod I overall for transfers and gait; min assist stairs  pain management, upright tolerance, functional strengthening, gait, stairs, balance, endurance   Communication             Safety/Cognition/ Behavioral Observations            Pain   No complain of pain  <2   Assess pain q shift and as needed   Skin   No major skin issues noted  Skin to be free of breakdown/infection with mod I  Assess skin q shift and as needed    Rehab Goals  Patient on target to meet rehab goals: Yes *See Care Plan and progress notes for long and short-term goals.     Barriers to Discharge  Current Status/Progress Possible Resolutions Date Resolved   Physician    Medical stability;Behavior        Ongoing medical management as outlined in the chart      Nursing                  PT  Decreased caregiver support  will need roommate to assist with household tasks and grocery shopping/etc              OT                  SLP                SW                Discharge Planning/Teaching Needs:  Pt plans to d/c home  with roomate who can provide intermittent support.  NA if able to reach mod ind goals.   Team Discussion:  MD reports primary issues are neuropathy, gout and likely plan for outpatient studies per neurology.  Currently supervision to steady assist with PT, OT and setting mod ind goals.  Gait distance very limited mostly due to pain.    Revisions to Treatment Plan:  None    Continued Need for Acute Rehabilitation Level of Care: The patient requires daily medical management by a physician with specialized training in physical medicine and rehabilitation for the following conditions: Daily direction of a multidisciplinary physical rehabilitation program to ensure safe treatment while eliciting the highest outcome that is of practical value to the patient.: Yes Daily medical management of patient stability for increased activity during participation in an intensive rehabilitation regime.: Yes Daily analysis of laboratory values and/or radiology reports with any subsequent need for medication adjustment of medical intervention for : Neurological problems;Blood pressure problems;Renal problems  Jacoba Cherney 08/03/2017, 10:24 AM

## 2017-08-03 NOTE — Progress Notes (Signed)
Occupational Therapy Note  Patient Details  Name: Tedra Senegalroy Schoene MRN: [id number] Date of Birth: May 10, 1965  Today's Date: 08/03/2017 OT Individual Time: [id number] OT Individual Time Calculation (min): 45 min   Pt c/o pain (unrated) BLE (bottoms of feet) and RUE; activity and repositioning of RUE in sling. Individual Therapy  OT intervention with focus on functional amb with hemi walker for simple home mgmt tasks in room.  Pt continues to c/o inconsistent dizziness with movement.  Pt amb with hemi walker 8498' without rest at supervision level.  Pt also engaged in conversation with staff members while walking.  Pt stated he was starting to feel dizzy and requested to sit back into w/c.  Pt returned to room and engaged in sit<>stand and standing tasks at sink.  Pt remained in w/c with all needs within reach.    Lavone NeriLanier, Chon Buhl [facility] Center[facility]ell 08/03/2017, 3:21 PM

## 2017-08-03 NOTE — Progress Notes (Signed)
Occupational Therapy Session Note  Patient Details  Name: Trevor Nunez MRN: [id number] Date of Birth: 1965/08/04  Today's Date: 08/03/2017 OT Individual Time: 1100-1155 OT Individual Time Calculation (min): 55 min    Short Term Goals: Week 1:  OT Short Term Goal 1 (Week 1): Pt will be able to tolerate ambulation from bed to his toilet with LRAD with S. OT Short Term Goal 2 (Week 1): Pt will be able to don pants with S. OT Short Term Goal 3 (Week 1): Pt will be able to don socks with min A and/or sock aide.  Skilled Therapeutic Interventions/Progress Updates:    Pt declined bathing/dressing this morning.  Pt stated he wanted to walk to get his legs stronger.  Pt amb with hemi walker in room, bathroom, and into hallway to nursing station (95') with steady A.  Pt requires more than a reasonable amount of time and is very deliberate during ambulation.  Pt returned to room and remained in w/c with all needs within reach.  Focus on functional amb with hemi walker, sit<>stand, standing balance, and safety awareness to increase independence with BADLs.   Therapy Documentation Precautions:  Precautions Precautions: Fall Precaution Comments: pt never had back precautions, was only using them for pain management Restrictions Weight Bearing Restrictions: No   Pain:  7/10 RUE increasing to 8/10 with activity; RN aware, repositioned, and sling for comfort ordred by PA  See Function Navigator for Current Functional Status.   Therapy/Group: Individual Therapy  Rich BraveLanier, Naaman Curro Chappell 08/03/2017, 11:56 AM

## 2017-08-04 ENCOUNTER — Inpatient Hospital Stay (HOSPITAL_COMMUNITY): Payer: Medicaid Other

## 2017-08-04 ENCOUNTER — Inpatient Hospital Stay (HOSPITAL_COMMUNITY): Payer: Medicaid Other | Admitting: Physical Therapy

## 2017-08-04 LAB — COMPREHENSIVE METABOLIC PANEL
ALBUMIN: 3.1 g/dL — AB (ref 3.5–5.0)
ALT: 93 U/L — ABNORMAL HIGH (ref 17–63)
AST: 24 U/L (ref 15–41)
Alkaline Phosphatase: 99 U/L (ref 38–126)
Anion gap: 8 (ref 5–15)
BUN: 30 mg/dL — AB (ref 6–20)
CHLORIDE: 99 mmol/L — AB (ref 101–111)
CO2: 27 mmol/L (ref 22–32)
Calcium: 9 mg/dL (ref 8.9–10.3)
Creatinine, Ser: 1.79 mg/dL — ABNORMAL HIGH (ref 0.61–1.24)
GFR calc Af Amer: 48 mL/min — ABNORMAL LOW (ref >60)
GFR calc non Af Amer: 42 mL/min — ABNORMAL LOW (ref >60)
GLUCOSE: 113 mg/dL — AB (ref 65–99)
POTASSIUM: 4.6 mmol/L (ref 3.5–5.1)
SODIUM: 134 mmol/L — AB (ref 135–145)
Total Bilirubin: 0.5 mg/dL (ref 0.3–1.2)
Total Protein: 7 g/dL (ref 6.5–8.1)

## 2017-08-04 LAB — CBC
HCT: 42.9 % (ref 39.0–52.0)
Hemoglobin: 14 g/dL (ref 13.0–17.0)
MCH: 29.8 pg (ref 26.0–34.0)
MCHC: 32.6 g/dL (ref 30.0–36.0)
MCV: 91.3 fL (ref 78.0–100.0)
Platelets: 316 10*3/uL (ref 150–400)
RBC: 4.7 MIL/uL (ref 4.22–5.81)
RDW: 14 % (ref 11.5–15.5)
WBC: 7.4 10*3/uL (ref 4.0–10.5)

## 2017-08-04 NOTE — Discharge Instructions (Signed)
Inpatient Rehab Discharge Instructions  Tedra Senegalroy Zahm Discharge date and time: No discharge date for patient encounter.   Activities/Precautions/ Functional Status: Activity: activity as tolerated Diet: regular diet Wound Care: keep wound clean and dry Functional status:  ___ No restrictions     ___ Walk up steps independently ___ 24/7 supervision/assistance   ___ Walk up steps with assistance ___ Intermittent supervision/assistance  ___ Bathe/dress independently ___ Walk with walker     _x__ Bathe/dress with assistance ___ Walk Independently    ___ Shower independently ___ Walk with assistance    ___ Shower with assistance ___ No alcohol     ___ Return to work/school ________    COMMUNITY REFERRALS UPON DISCHARGE:    Medical Equipment/Items Ordered:  Wheelchair, cushion, hemi walker                                                      Agency/Supplier:  [facility] @ [phone]       Special Instructions: No driving smoking or alcohol   My questions have been answered and I understand these instructions. I will adhere to these goals and the provided educational materials after my discharge from the hospital.  Patient/Caregiver Signature _______________________________ Date __________  Clinician Signature _______________________________________ Date __________  Please bring this form and your medication list with you to all your follow-up doctor's appointments.

## 2017-08-04 NOTE — Progress Notes (Signed)
Physical Therapy Session Note  Patient Details  Name: Trevor Nunez MRN: [id number] Date of Birth: August 13, 1965  Today's Date: 08/04/2017 PT Individual Time: 0900-1015 PT Individual Time Calculation (min): 75 min   Short Term Goals: Week 1:  PT Short Term Goal 1 (Week 1): Pt will transfer with supervision and LRAD PT Short Term Goal 2 (Week 1): Pt will ambulate 10850' with LRAD and min guard PT Short Term Goal 3 (Week 1): Pt will initiate stair training with PT for strengthening and balance   Skilled Therapeutic Interventions/Progress Updates:    Pt asleep in bed, easily arousable and agreeable with encouragement to participate in therapy session. Pt reports 10/10 pain at rest in RUE and BLE, received pain medication recently per pt. Supine to sit with Supervision with use of bedrail. Stand pivot transfer bed to w/c with Supervision with use of hemiwalker. Pt completes ADLs while seated in w/c at sink with setup assist to wash face and brush teeth. Ambulation x 75 ft, x 112 ft with hemiwalker and Supervision, minimal verbal cueing for technique and safe use of HW. Pt reports onset of increased sensitivity to lateral and plantar areas of feet when in WBing, rated 10/10 but pt is able to continue with ambulation despite pain. Car transfer with Supervision with verbal cues for technique with use of HW. Pt left seated in w/c in room with needs in reach.  Therapy Documentation Precautions:  Precautions Precautions: Fall Precaution Comments: pt never had back precautions, was only using them for pain management Restrictions Weight Bearing Restrictions: No  See Function Navigator for Current Functional Status.   Therapy/Group: Individual Therapy  Peter Congoaylor Keiera Strathman, PT, DPT  08/04/2017, 11:10 AM

## 2017-08-04 NOTE — Progress Notes (Signed)
Patient states pain is getting worse in lateral aspects of both feet.  He states it is starting to feel like the pain he experienced right before he lost control of his legs and was admitted to the hospital.  Patient stated he informed Dr. Riley KillSwartz of the pain on rounding this morning and that Dr. Riley KillSwartz mentioned about changing up some medications to help with the pain.  No new orders were written.  Notified Deatra Inaan Angiulli, PA.  He is to consult Dr. Riley KillSwartz for plan.  Dani Gobbleeardon, Louana Fontenot J, RN

## 2017-08-04 NOTE — Progress Notes (Signed)
Physical Therapy Note  Patient Details  Name: Trevor Nunez MRN: [id number] Date of Birth: 1966/04/13 Today's Date: 08/04/2017   1350-1450, 60 min individual tx Pain: 10/10 bil feet;, R elbow,premedicated; will ask for meds when he returns from tx  Pt stated that his feet hurt so much that he didn't know how he could participate, but was willing to try.   Stand pivot with HW with close supervision.  Strengthening/activity tolerance using NuSTep at level 4 x 8 minutes, using bil LEs only, rated 12 on BORG scale.  neuromuscular re-education via forced use, positioning for sustained stretch bil hamstrings and heel cords, and balance challenge, standing on wedge during bil UE activity on sturdy table in front of him.  Pt made peg design per picture, with 1 cue to correct 1/17 pegs.    Gait training over level tile in congested day room, requiring side stepping with RW R/L, forward, and turning HW side ways depending upon width of openings in chairs and tables.    Pt used urinal in sitting in w/c in room, independently.  Pt left resting in w/c with alarm set and all needs at hand.  See function navigator for current status.  Melina Mosteller 08/04/2017, 7:59 AM

## 2017-08-04 NOTE — Plan of Care (Signed)
  Problem: Consults Goal: RH GENERAL PATIENT EDUCATION Description See Patient Education module for education specifics. Outcome: Progressing Goal: Skin Care Protocol Initiated - if Braden Score 18 or less Description If consults are not indicated, leave blank or document N/A Outcome: Progressing   Problem: RH SKIN INTEGRITY Goal: RH STG SKIN FREE OF INFECTION/BREAKDOWN Description Min assist.  Outcome: Progressing Goal: RH STG ABLE TO PERFORM INCISION/WOUND CARE W/ASSISTANCE Description STG Able To Perform Incision/Wound Care With min Assistance.  Outcome: Progressing   Problem: RH SAFETY Goal: RH STG ADHERE TO SAFETY PRECAUTIONS W/ASSISTANCE/DEVICE Description STG Adhere to Safety Precautions With min Assistance/Device.  Outcome: Progressing   Problem: RH PAIN MANAGEMENT Goal: RH STG PAIN MANAGED AT OR BELOW PT'S PAIN GOAL Description < 4  Outcome: Progressing

## 2017-08-04 NOTE — Progress Notes (Signed)
[facility]     PROGRESS NOTE    Subjective/Complaints: Pt did a little better after anxiety yesterday. Slept ok last night.  ROS: Patient denies fever, rash, sore throat, blurred vision, nausea, vomiting, diarrhea, cough, shortness of breath or chest pain, joint or back pain, headache, or mood change.   Objective: Vital Signs: Blood pressure 120/86, pulse 79, temperature 98.2 F (36.8 C), temperature source Oral, resp. rate 18, height 6' (1.829 m), weight (!) 137.2 kg (302 lb 7.5 oz), SpO2 100 %. No results found. Recent Labs    08/01/17 1024 08/04/17 0616  WBC 12.3* 7.4  HGB 14.5 14.0  HCT 45.0 42.9  PLT 443* 316   Recent Labs    08/01/17 1024 08/04/17 0616  NA 136 134*  K 4.1 4.6  CL 102 99*  GLUCOSE 127* 113*  BUN 41* 30*  CREATININE 1.97* 1.79*  CALCIUM 8.8* 9.0   CBG (last 3)  No results for input(s): GLUCAP in the last 72 hours.  Wt Readings from Last 3 Encounters:  08/04/17 (!) 137.2 kg (302 lb 7.5 oz)  07/27/17 134.2 kg (295 lb 13.7 oz)  07/16/17 127 kg (280 lb)    Physical Exam:  Constitutional: No distress . Vital signs reviewed. HEENT: EOMI, oral membranes moist Cardiovascular: RRR without murmur. No JVD    Respiratory: CTA Bilaterally without wheezes or rales. Normal effort    GI: BS +, non-tender, non-distended   Musculoskeletal: RUE  in splint. Knees minimally tender to palpation Neurological: He is alert.  Motor: LUE: 5/5 proximal to distal LLE: HF 4/5, KE 5/5, ADF 5/5 RLE: HF 4/5, KE 4/5, ADF 5/5 RUE: shoulder abduction 4/5, elbow dressed, hand grip 4+/5 (stable) Decreased sensation anterolateral thighs bilaterally Skin: RUE with dressing/splint C/D/I Psychiatric: more pleasant today   Assessment/Plan: 1. Functional deficits secondary to debility from multiple medical issues including gout which require 3+ hours per day of interdisciplinary therapy in a comprehensive inpatient rehab  setting. Physiatrist is providing close team supervision and 24 hour management of active medical problems listed below. Physiatrist and rehab team continue to assess barriers to discharge/monitor patient progress toward functional and medical goals.  Function:  Bathing Bathing position Bathing activity did not occur: Refused Position: Systems developer parts bathed by patient: Right arm, Left arm, Chest, Abdomen, Front perineal area, Right upper leg, Left upper leg Body parts bathed by helper: Right lower leg, Left lower leg, Back, Buttocks  Bathing assist Assist Level: Touching or steadying assistance(Pt > 75%)      Upper Body Dressing/Undressing Upper body dressing Upper body dressing/undressing activity did not occur: Refused What is the patient wearing?: Pull over shirt/dress     Pull over shirt/dress - Perfomed by patient: Thread/unthread right sleeve, Thread/unthread left sleeve, Put head through opening, Pull shirt over trunk          Upper body assist Assist Level: More than reasonable time, Set up   Set up : To obtain clothing/put away  Lower Body Dressing/Undressing Lower body dressing Lower body dressing/undressing activity did not occur: Refused What is the patient wearing?: Underwear, Pants, Ted Hose, Non-skid slipper socks Underwear - Performed by patient: Thread/unthread right underwear leg, Thread/unthread left underwear leg, Pull underwear up/down Underwear - Performed by helper: Thread/unthread right underwear leg Pants- Performed by patient: Thread/unthread right pants leg, Thread/unthread left pants leg, Pull pants up/down Pants- Performed by helper: Thread/unthread right pants leg, Thread/unthread left pants leg   Non-skid slipper socks-  Performed by helper: Don/doff right sock, Don/doff left sock               TED Hose - Performed by helper: Don/doff right TED hose, Don/doff left TED hose  Lower body assist Assist for lower body dressing:  Supervision or verbal cues      Toileting Toileting   Toileting steps completed by patient: Adjust clothing prior to toileting Toileting steps completed by helper: Adjust clothing after toileting Toileting Assistive Devices: Other (comment)(urinal)  Toileting assist Assist level: Touching or steadying assistance (Pt.75%)   Transfers Chair/bed transfer   Chair/bed transfer method: Stand pivot Chair/bed transfer assist level: Supervision or verbal cues Chair/bed transfer assistive device: Other, Armrests(hemiwalker)     Locomotion Ambulation     Max distance: 12' Assist level: Supervision or verbal cues   Wheelchair   Type: Manual Max wheelchair distance: 60 Assist Level: Supervision or verbal cues  Cognition Comprehension Comprehension assist level: Follows complex conversation/direction with no assist  Expression Expression assist level: Expresses complex ideas: With no assist  Social Interaction Social Interaction assist level: Interacts appropriately with others - No medications needed.  Problem Solving Problem solving assist level: Solves complex problems: Recognizes & self-corrects  Memory Memory assist level: Complete Independence: No helper   Medical Problem List and Plan: 1.  Decreased functional mobility secondary to bilateral lower extremity weakness and distal sensory loss, edema and decreased sensation.  Plan follow-up outpatient with neurology services for paraspinal EMG and possible paraspinal muscle biopsy   -Continue CIR, PT, OT 2.  DVT Prophylaxis/Anticoagulation: Subcutaneous heparin.  Venous Doppler studies negative 3. Pain Management: Elavil 25 mg nightly   -ROM and local care to right knee/elbow   -Lyrica 75 mg twice daily (increased 4/15)   -?meralgia paresthetica. No evidence of lumbar radic 4. Mood: Provide emotional support 5. Neuropsych: This patient is capable of making decisions on his own behalf.  -anxiety an issue 6. Skin/Wound Care: Routine  skin checks 7. Fluids/Electrolytes/Nutrition:     -encourage PO, appropriate dietary intake 8.  Acute kidney injury on chronic kidney disease stage III.  Continue Demadex 60 mg daily and Aldactone 25 mg daily.  Follow-up renal services   Creatinine  1.79 today   Continue to monitor 9.  Gout.  Uric acid level 13.4.  Right knee joint aspiration 07/25/2017.  Prednisone as directed 07/26/2017 x 5 days completed   Sx improved 10.  Hypertension.  Coreg 12.5 mg twice daily Vitals:   08/03/17 1523 08/04/17 0521  BP: (!) 134/100 120/86  Pulse: 92 79  Resp: 18 18  Temp: 98.1 F (36.7 C) 98.2 F (36.8 C)  SpO2: 100% 100%   Improving control. Occasional spikes with pain/anxiety 11.  Right arm injury after fall.  Conservative care per orthopedic services and fitted with a posterior elbow splint.  Weightbearing as tolerated 12.  OSA.  Recently diagnosed in February.  Still awaiting CPAP machine at home.  Patient has been refusing. 13.  Diastolic congestive heart failure.  Monitor for any signs of fluid overload Filed Weights   08/02/17 0437 08/03/17 0519 08/04/17 0521  Weight: (!) 137.3 kg (302 lb 11.1 oz) 134 kg (295 lb 6.7 oz) (!) 137.2 kg (302 lb 7.5 oz)    Weights stable 14.  Prediabetes with Medical noncompliance.    Provide counseling  -intermittent hyperglycemia 15. Transaminitis  LFTs returning to near normal   -will hold on hepatitis panel 16. Leukocytosis   WBC down to 7.4 today  Afebrile  LOS (Days) 8 A FACE TO FACE EVALUATION WAS PERFORMED  Ranelle OysterZachary T Swartz, MD 08/04/2017 8:50 AM

## 2017-08-04 NOTE — Progress Notes (Signed)
Occupational Therapy Note  Patient Details  Name: Trevor Nunez MRN: [id number] Date of Birth: 28-Dec-1965  Today's Date: 08/04/2017 OT Individual Time: 1100-1154 OT Individual Time Calculation (min): 54 min   Pt c/o increased pain on lateral aspects of B feet when ambulating; RN aware Individual Therapy  Pt resting in w/c upon arrival and declined bathing/dressing this morning.  Pt agreeable to shower tomorrow.  Pt initially engaged in BLE therex on Nustep (5 mins at 5 and 5 mins at 3). Pt required rest breaks X 3 during exercise.  Pt amb with hemi walk back to room with 4 standing rest breaks.  Pt c/o increased pain with ambulation along lateral aspects of B feet.  Pt engaged in simple home mgmt tasks in room and amb in/out of bathroom.  Pt states that "overall" his dizziness is improving. Pt remained in w/c with all needs within reach. Focus on functional amb with hemi walker for simple home mgmt tasks to increase independence with BADLs.    Lavone NeriLanier, Saleemah Mollenhauer [facility]es[facility]ell 08/04/2017, 11:56 AM

## 2017-08-05 ENCOUNTER — Inpatient Hospital Stay (HOSPITAL_COMMUNITY): Payer: Medicaid Other | Admitting: Physical Therapy

## 2017-08-05 ENCOUNTER — Inpatient Hospital Stay (HOSPITAL_COMMUNITY): Payer: Medicaid Other

## 2017-08-05 MED ORDER — PREGABALIN 75 MG PO CAPS: 75.0000 mg | ORAL_CAPSULE | Freq: Two times a day (BID) | ORAL | 1 refills | Status: AC

## 2017-08-05 MED ORDER — TRAMADOL HCL 50 MG PO TABS: 50.0000 mg | ORAL_TABLET | Freq: Four times a day (QID) | ORAL | 0 refills | Status: AC | PRN

## 2017-08-05 MED ORDER — CARVEDILOL 12.5 MG PO TABS: 12.5000 mg | ORAL_TABLET | Freq: Two times a day (BID) | ORAL | 0 refills | Status: AC

## 2017-08-05 MED ORDER — AMITRIPTYLINE HCL 25 MG PO TABS: 25.0000 mg | ORAL_TABLET | Freq: Every day | ORAL | 0 refills | Status: AC

## 2017-08-05 MED ORDER — SPIRONOLACTONE 25 MG PO TABS: 25.0000 mg | ORAL_TABLET | Freq: Every day | ORAL | 0 refills | Status: AC

## 2017-08-05 MED ORDER — TORSEMIDE 20 MG PO TABS: 60.0000 mg | ORAL_TABLET | Freq: Every day | ORAL | 0 refills | Status: AC

## 2017-08-05 NOTE — Progress Notes (Signed)
Occupational Therapy Session Note  Patient Details  Name: Trevor Nunez MRN: [id number] Date of Birth: Dec 10, 1965  Today's Date: 08/05/2017 OT Individual Time: 1000-1100 OT Individual Time Calculation (min): 60 min    Short Term Goals: Week 1:  OT Short Term Goal 1 (Week 1): Pt will be able to tolerate ambulation from bed to his toilet with LRAD with S. OT Short Term Goal 2 (Week 1): Pt will be able to don pants with S. OT Short Term Goal 3 (Week 1): Pt will be able to don socks with min A and/or sock aide.  Skilled Therapeutic Interventions/Progress Updates:    Pt engaged in BADL retraining including bathing at shower level, dressing with sit<>stand from seat, functional transfers, and toileting tasks.  Pt issued long handle sponge, sock aide, and toilet aide to assist with tasks and increase independence with BADLs. Pt completes all bathing/dressing and toileting tasks at mod I level.  Pt completes all functional transfers at mod I level.  Pt uses AE appropriately to increase independence. Pt remained in w/c with visitor present. All needs within reach.   Therapy Documentation Precautions:  Precautions Precautions: Fall Precaution Booklet Issued: No Precaution Comments: pt never had back precautions, was only using them for pain management Restrictions Weight Bearing Restrictions: No Pain: Pain Assessment Pain Scale: 0-10 Pain Score: 8  Pain Type: Acute pain Pain Location: Foot Pain Orientation: Right;Left Pain Descriptors / Indicators: Aching Pain Frequency: Constant Pain Onset: On-going Patients Stated Pain Goal: 0 Pain Intervention(s): RN made aware;Emotional support A See Function Navigator for Current Functional Status.   Therapy/Group: Individual Therapy  Rich BraveLanier, Navy Belay Chappell 08/05/2017, 11:08 AM

## 2017-08-05 NOTE — Discharge Summary (Signed)
NAME:  Hal HopeBROOKS, Yan                      ACCOUNT NO.:  MEDICAL RECORD NO.:  [id number]  LOCATION:                                 FACILITY:  PHYSICIAN:  Ranelle OysterZachary T. Swartz, M.D.DATE OF BIRTH:  [birth year]  DATE OF ADMISSION:  07/27/2017 DATE OF DISCHARGE:                              DISCHARGE SUMMARY   DISCHARGE DIAGNOSES: 1. Bilateral lower extremity weakness and distal sensory loss, edema,     and decreased sensation. 2. Subcutaneous heparin for DVT prophylaxis, pain management. 3. Acute kidney injury versus chronic kidney disease, stage 3. 4. Gout. 5. Right arm injury after fall. 6. Obstructive sleep apnea. 7. Diastolic congestive heart failure. 8. Prediabetes with medical noncompliance.  HISTORY OF PRESENT ILLNESS:  This is a 52 year old right-handed male with history of gout, maintained on allopurinol; tobacco abuse; morbid obesity; hypertension; CKD, stage 3; chronic diastolic congestive heart failure; recently diagnosed with obstructive sleep apnea in February, still awaiting his CPAP machine; and medical noncompliance.  He lives with a roommate, independent prior to admission.  He is unemployed. Presented on July 20, 2017, with lower extremity weakness x2 weeks, significant pain in anterior lateral aspect of leg, noted fall landing on his right arm.  X-rays of right elbow joint; effusion, suspicious for occult fracture, initially placed in a sugar tong cast, removed on July 25, 2017.  Plan to follow up outpatient Orthopedic Service, Dr. Margarita Ranaimothy Murphy.  MRI of the brain unremarkable.  Lumbar puncture; protein 43, glucose 74.  HIV screen nonreactive.  Sedimentation rate 65, uric acid level 13.4.  MRI lumbar cervical thoracic spine showed lumbar spondylosis, predominantly L4-L5 and L5-S1.  No significant canal stenosis or fracture.  There was some neural foraminal narrowing at C3- C4 through C6-C7, moderate to severe on the right to C4-C5.  X-rays of right knee show a  large joint effusion, small radial tear of the medial meniscus body.  Echocardiogram with ejection fraction of 70%, grade 2 diastolic dysfunction.  Neurology recommended possible paraspinal EMG to be completed as an outpatient with possible paraspinal muscle biopsy, but no further workup indicated while in the hospital.  Noted creatinine 2.19.  Troponin 0.09.  Renal ultrasound negative.  Follow up Renal Services for acute kidney injury versus complicated chronic kidney disease and advised continue to monitor while on Aldactone.  Underwent right knee aspiration joint effusion on July 25, 2017, showing urate crystals with increased WBCs, placed on prednisone x5 days for gout flare-up.  Subcutaneous heparin for DVT prophylaxis.  Venous Doppler studies negative.  Physical and occupational therapy ongoing.  The patient was admitted for a comprehensive rehab program.  PAST MEDICAL HISTORY:  See discharge diagnoses.  SOCIAL HISTORY:  Lives with a roommate, independent until 2 weeks ago.  FUNCTIONAL STATUS UPON ADMISSION TO REHAB SERVICES:  Moderate assist sit to stand, +2 stand pivot transfers, min-to-mod assist activities of daily living.  PHYSICAL EXAMINATION:  VITAL SIGNS:  Blood pressure 144/100, pulse 79, temperature 97, and respirations 20. GENERAL:  Alert male, in no acute distress. EYES:  EOMs intact. NECK:  Supple.  Nontender.  No JVD. CARDIAC:  Rate controlled. ABDOMEN:  Soft and nontender.  Good bowel sounds. LUNGS:  Clear to auscultation without wheeze. EXTREMITIES:  Right upper extremity with dressing in place.  Clean and dry.  REHABILITATION HOSPITAL COURSE:  The patient was admitted to Inpatient Rehab Services.  Therapies initiated on a 3-hour daily basis consisting of physical therapy, occupational therapy, and rehabilitation nursing. The following issues were addressed during the patient's rehabilitation stay.  Pertaining to Mr. Platts' bilateral lower extremity  weakness, distal sensory loss, and edema, plan was to follow up outpatient Neurology Services for paraspinal EMG and possible paraspinal muscle biopsy.  He continued to progress nicely with his therapies. Subcutaneous heparin for DVT prophylaxis.  Venous Doppler studies are negative.  Pain management with the use of Elavil as well as Lyrica which was adjusted accordingly, question meralgia paresthetica.  No evidence of lumbar radiculopathy.  Acute kidney disease, he continued on Demadex and aldactone.  Creatinine stable at 1.79, felt to be acute versus chronic.  Noted gout, uric acid level 13.4, responded well to prednisone.  He did undergo a right knee joint aspiration on July 25, 2017.  His mobility continued to progress.  Blood pressures controlled with Coreg as well as ongoing Aldactone and Demadex.  No orthostatic changes.  Right arm injury; conservative care, sugar tong cast had been removed.  Posterior elbow splint in place.  Weightbearing as tolerated. Neurovascular sensation intact.  He would follow up with Orthopedic Services, Dr. Margarita Rana.  Noted recently diagnosed in February for obstructive sleep apnea.  He was awaiting his CPAP machine at home.  He was refusing CPAP while in the hospital.  He exhibited no signs of fluid overload.  Voiding without difficulty.  Noted history of medical noncompliance, receiving counseling in regard to maintaining his current medical regimen.  The patient received weekly collaborative interdisciplinary team conferences to discuss estimated length of stay, family teaching, any barriers to discharge.  He was ambulating 112 feet with a hemi-walker supervision.  Minimal verbal cues.  Working with energy conservation techniques.  Modified independence, supervision, bed mobility with the use of a handrail.  He could gather his belongings for activities of daily living and homemaking.  He engaged in simple home management tasks in the room and  ambulated in and out of the bathroom. He was advised no driving.  The patient was encouraged with his overall progress and plan discharge to home.  DISCHARGE MEDICATIONS:  Include: 1. Elavil 25 mg as directed. 2. Aspirin 81 mg p.o. daily. 3. Coreg 12.5 mg p.o. b.i.d. 4. Lyrica 75 mg p.o. b.i.d. 5. Aldactone 25 mg p.o. daily. 6. Demadex 60 mg p.o. daily. 7. Ultram 50 mg p.o. every 6 hours as needed for pain.  DIET:  His diet was a regular diet.  FOLLOWUP:  He would follow up with Dr. Faith Rogue at the Outpatient Rehab Service Office as needed; Dr. Zetta Bills, Renal Services, call for appointment; Dr. Margarita Rana, call for appointment in 2 weeks; and Dr. Barbette Reichmann, Medical Management.  Ambulatory referral was obtained with neurology services to address outpatient paraspinal EMG and possible need for paraspinal muscle biopsy  SPECIAL INSTRUCTIONS: 1. No driving. 2. Weightbearing as tolerated. 3. No tobacco use.     Mariam Dollar, P.A.   ______________________________ Ranelle Oyster, M.D.    DA/MEDQ  D:  08/05/2017  T:  08/05/2017  Job:  [id number]  cc:   Dr. Tereso Newcomer, MD Ranelle Oyster, M.D. Barbette Reichmann, MD

## 2017-08-05 NOTE — Plan of Care (Signed)
Pt refusing therapy during PM session. Unable to assess car transfers, gait, or stair negotiation.  Based on documentation from previous sessions, pt was ambulating with distant supervision, had completed a car transfer on 4/18 with supervision.  Stair negotiation not attempted.     Problem: RH Car Transfers Goal: LTG Patient will perform car transfers with assist (PT) Description LTG: Patient will perform car transfers with assistance (PT). Outcome: Adequate for Discharge   Problem: RH Ambulation Goal: LTG Patient will ambulate in controlled environment (PT) Description LTG: Patient will ambulate in a controlled environment, # of feet with assistance (PT). Outcome: Adequate for Discharge Goal: LTG Patient will ambulate in home environment (PT) Description LTG: Patient will ambulate in home environment, # of feet with assistance (PT). Outcome: Adequate for Discharge Goal: LTG Patient will ambulate in community environment (PT) Description LTG: Patient will ambulate in community environment, # of feet with assistance (PT). Outcome: Adequate for Discharge   Problem: RH Stairs Goal: LTG Patient will ambulate up and down stairs w/assist (PT) Description LTG: Patient will ambulate up and down # of stairs with assistance (PT) Outcome: Not Met (add Reason)

## 2017-08-05 NOTE — Plan of Care (Signed)
  Problem: Consults Goal: RH GENERAL PATIENT EDUCATION Description See Patient Education module for education specifics. Outcome: Progressing Goal: Skin Care Protocol Initiated - if Braden Score 18 or less Description If consults are not indicated, leave blank or document N/A Outcome: Progressing   Problem: RH SKIN INTEGRITY Goal: RH STG SKIN FREE OF INFECTION/BREAKDOWN Description Min assist.  Outcome: Progressing Goal: RH STG ABLE TO PERFORM INCISION/WOUND CARE W/ASSISTANCE Description STG Able To Perform Incision/Wound Care With min Assistance.  Outcome: Progressing   Problem: RH SAFETY Goal: RH STG ADHERE TO SAFETY PRECAUTIONS W/ASSISTANCE/DEVICE Description STG Adhere to Safety Precautions With min Assistance/Device.  Outcome: Progressing   Problem: RH PAIN MANAGEMENT Goal: RH STG PAIN MANAGED AT OR BELOW PT'S PAIN GOAL Description < 4  Outcome: Progressing   

## 2017-08-05 NOTE — Progress Notes (Signed)
Occupational Therapy Note  Patient Details  Name: Trevor Nunez MRN: [id number] Date of Birth: 1965/09/02  Today's Date: 08/05/2017 OT Individual Time: 1300-1330 OT Individual Time Calculation (min): 30 min  and Today's Date: 08/05/2017 OT Missed Time: 15 Minutes Missed Time Reason: Patient fatigue;Patient unwilling/refused to participate without medical reason  Pt c/o 10/10 pain in B feet; RN aware Individual therapy  Pt resting in bed upon arrival.  Pt stated he "couldn't get out of bed" because he needed to rest.  Continued ongoing discharge planning and AE education.  Pt issued reacher, long handle sponge, sock aide, and toilet aide.  Pt required max verbal cues to open eyes throughout session.  Pt remained in bed with all needs within reach.    Lavone NeriLanier, Trevor Nunez 08/05/2017, 2:32 PM

## 2017-08-05 NOTE — Progress Notes (Signed)
PHYSICAL MEDICINE & REHABILITATION     PROGRESS NOTE    Subjective/Complaints: Slept ok. Still with pain over anterolateral thighs, left more than right.   ROS: Patient denies fever, rash, sore throat, blurred vision, nausea, vomiting, diarrhea, cough, shortness of breath or chest pain,   headache, or mood change. .   Objective: Vital Signs: Blood pressure 121/90, pulse 88, temperature 98.2 F (36.8 C), resp. rate 19, height 6' (1.829 m), weight (!) 136.4 kg (300 lb 11.3 oz), SpO2 93 %. No results found. Recent Labs    08/04/17 0616  WBC 7.4  HGB 14.0  HCT 42.9  PLT 316   Recent Labs    08/04/17 0616  NA 134*  K 4.6  CL 99*  GLUCOSE 113*  BUN 30*  CREATININE 1.79*  CALCIUM 9.0   CBG (last 3)  No results for input(s): GLUCAP in the last 72 hours.  Wt Readings from Last 3 Encounters:  08/05/17 (!) 136.4 kg (300 lb 11.3 oz)  07/27/17 134.2 kg (295 lb 13.7 oz)  07/16/17 127 kg (280 lb)    Physical Exam:  Constitutional: No distress . Vital signs reviewed. HEENT: EOMI, oral membranes moist Cardiovascular: RRR without murmur. No JVD    Respiratory: CTA Bilaterally without wheezes or rales. Normal effort    GI: BS +, non-tender, non-distended   Musculoskeletal: RUE  in splint. Knees minimally tender to palpation Neurological: He is alert.  Motor: LUE: 5/5 proximal to distal LLE: HF 4/5, KE 5/5, ADF 5/5 RLE: HF 4/5, KE 4/5, ADF 5/5 RUE: shoulder abduction 4/5, elbow dressed, hand grip 4+/5 (stable) Decreased sensation anterolateral thighs bilaterally Skin: RUE with dressing/splint C/D/I Psychiatric: more pleasant today   Assessment/Plan: 1. Functional deficits secondary to debility from multiple medical issues including gout which require 3+ hours per day of interdisciplinary therapy in a comprehensive inpatient rehab setting. Physiatrist is providing close team supervision and 24 hour management of active medical problems listed below. Physiatrist  and rehab team continue to assess barriers to discharge/monitor patient progress toward functional and medical goals.  Function:  Bathing Bathing position Bathing activity did not occur: Refused Position: Systems developerhower  Bathing parts Body parts bathed by patient: Right arm, Left arm, Chest, Abdomen, Front perineal area, Right upper leg, Left upper leg Body parts bathed by helper: Right lower leg, Left lower leg, Back, Buttocks  Bathing assist Assist Level: Touching or steadying assistance(Pt > 75%)      Upper Body Dressing/Undressing Upper body dressing Upper body dressing/undressing activity did not occur: Refused What is the patient wearing?: Pull over shirt/dress     Pull over shirt/dress - Perfomed by patient: Thread/unthread right sleeve, Thread/unthread left sleeve, Put head through opening, Pull shirt over trunk          Upper body assist Assist Level: More than reasonable time, Set up   Set up : To obtain clothing/put away  Lower Body Dressing/Undressing Lower body dressing Lower body dressing/undressing activity did not occur: Refused What is the patient wearing?: Underwear, Pants, Ted Hose, Non-skid slipper socks Underwear - Performed by patient: Thread/unthread right underwear leg, Thread/unthread left underwear leg, Pull underwear up/down Underwear - Performed by helper: Thread/unthread right underwear leg Pants- Performed by patient: Thread/unthread right pants leg, Thread/unthread left pants leg, Pull pants up/down Pants- Performed by helper: Thread/unthread right pants leg, Thread/unthread left pants leg   Non-skid slipper socks- Performed by helper: Don/doff right sock, Don/doff left sock  TED Hose - Performed by helper: Don/doff right TED hose, Don/doff left TED hose  Lower body assist Assist for lower body dressing: Supervision or verbal cues      Toileting Toileting Toileting activity did not occur: N/A Toileting steps completed by patient:  Adjust clothing prior to toileting, Performs perineal hygiene, Adjust clothing after toileting Toileting steps completed by helper: Adjust clothing after toileting Toileting Assistive Devices: Other (comment)(urinal)  Toileting assist Assist level: Touching or steadying assistance (Pt.75%)   Transfers Chair/bed transfer Chair/bed transfer activity did not occur: N/A Chair/bed transfer method: Stand pivot, Ambulatory Chair/bed transfer assist level: No Help, no cues, assistive device, takes more than a reasonable amount of time Chair/bed transfer assistive device: Armrests, Other(HW)     Locomotion Ambulation Ambulation activity did not occur: N/A   Max distance: 70 Assist level: Touching or steadying assistance (Pt > 75%)   Wheelchair   Type: Manual Max wheelchair distance: 150 Assist Level: No help, No cues, assistive device, takes more than reasonable amount of time  Cognition Comprehension Comprehension assist level: Follows basic conversation/direction with extra time/assistive device  Expression Expression assist level: Expresses basic needs/ideas: With no assist  Social Interaction Social Interaction assist level: Interacts appropriately 90% of the time - Needs monitoring or encouragement for participation or interaction.  Problem Solving Problem solving assist level: Solves basic 90% of the time/requires cueing < 10% of the time  Memory Memory assist level: More than reasonable amount of time   Medical Problem List and Plan: 1.  Decreased functional mobility secondary to bilateral lower extremity weakness and distal sensory loss, edema and decreased sensation.    -Plan follow-up outpatient with neurology services for paraspinal EMG and possible paraspinal muscle biopsy   -Continue CIR, PT, OT   -ELOS 4/20, follow up with me as needed   -will ask ortho tech to re-wrap/tighten elbow splint today 2.  DVT Prophylaxis/Anticoagulation: Subcutaneous heparin.  Venous Doppler studies  negative 3. Pain Management: Elavil 25 mg nightly   -ROM and local care to right knee/elbow   -Lyrica 75 mg twice daily (increased 4/15)   -?meralgia paresthetica. No evidence of lumbar radic on exam or MRI 4. Mood: Provide emotional support 5. Neuropsych: This patient is capable of making decisions on his own behalf.  -anxiety an issue 6. Skin/Wound Care: Routine skin checks 7. Fluids/Electrolytes/Nutrition:     -encourage PO, appropriate dietary intake 8.  Acute kidney injury on chronic kidney disease stage III.  Continue Demadex 60 mg daily and Aldactone 25 mg daily.  Follow-up renal services   Creatinine  1.79    Continue to monitor 9.  Gout.  Uric acid level 13.4.  Right knee joint aspiration 07/25/2017.  Prednisone as directed 07/26/2017 x 5 days completed   Sx improved 10.  Hypertension.  Coreg 12.5 mg twice daily Vitals:   08/04/17 1527 08/05/17 0539  BP: 125/89 121/90  Pulse: 89 88  Resp: 18 19  Temp: 98.7 F (37.1 C) 98.2 F (36.8 C)  SpO2: 98% 93%   Improving control overall. Occasional spikes with pain/anxiety 11.  Right arm injury after fall, ?occult fracture.  Conservative care per orthopedic services and fitted with a posterior elbow splint.  Weightbearing as tolerated 12.  OSA.  Recently diagnosed in February.  Still awaiting CPAP machine at home.  Patient has been refusing. 13.  Diastolic congestive heart failure.  Monitor for any signs of fluid overload Filed Weights   08/03/17 0519 08/04/17 0521 08/05/17 0539  Weight: 134 kg (  295 lb 6.7 oz) (!) 137.2 kg (302 lb 7.5 oz) (!) 136.4 kg (300 lb 11.3 oz)    Weights remain stable 14.  Prediabetes with Medical noncompliance.    Provide counseling  -intermittent hyperglycemia 15. Transaminitis: reactive?  LFTs nearly back to normal on 4/18     16. Leukocytosis   WBC down to 7.4   Afebrile       LOS (Days) 9 A FACE TO FACE EVALUATION WAS PERFORMED  Ranelle Oyster, MD 08/05/2017 10:16 AM

## 2017-08-05 NOTE — Discharge Summary (Signed)
Discharge summary job [id number]

## 2017-08-05 NOTE — Progress Notes (Signed)
Occupational Therapy Discharge Summary  Patient Details  Name: Trevor Nunez MRN: [id number] Date of Birth: [birth year]  Patient has met 8 of 8 long term goals due to improved activity tolerance, postural control, ability to compensate for deficits and improved awareness.  Pt made steady progress with BADLs and IADLs during this admission.  Pt continues to c/o BLE pain and RUE pain necessitating multiple rest breaks during ADLS.  Pt issued reacher, sock aide, long handle sponge, and toilet aide to assist with BADLs.  Pt is mod I for all functional transfers and functional ambulation for simple home management tasks.  Pt is mod I for all BADLs. Family/friends have not been present for education. Patient to discharge at overall Modified Independent level.  Patient's care partner is independent to provide the necessary physical assistance at discharge.     Recommendation:  Patient requires no further skilled OT services upon d/c.   Equipment: No equipment provided Pt states he already owns necessary equipment.  Shower seat recommended.  Reasons for discharge: treatment goals met and discharge from hospital  Patient/family agrees with progress made and goals achieved: Yes  OT Discharge   Vision Baseline Vision/History: Wears glasses Wears Glasses: Distance only Patient Visual Report: No change from baseline Vision Assessment?: No apparent visual deficits Perception  Perception: Within Functional Limits Praxis Praxis: Intact Cognition Overall Cognitive Status: Within Functional Limits for tasks assessed Arousal/Alertness: Awake/alert Orientation Level: Oriented X4 Attention: Selective Selective Attention: Appears intact Memory: Appears intact Awareness: Appears intact Problem Solving: Appears intact Safety/Judgment: Appears intact Sensation Sensation Light Touch: Impaired Detail Light Touch Impaired Details: Impaired RLE Stereognosis: Appears Intact Hot/Cold: Appears  Intact Proprioception: Appears Intact Coordination Gross Motor Movements are Fluid and Coordinated: Yes Fine Motor Movements are Fluid and Coordinated: Yes Motor  Motor Motor: Within Functional Limits Mobility  Bed Mobility Bed Mobility: Supine to Sit Supine to Sit: 6: Modified independent (Device/Increase time)  Trunk/Postural Assessment  Cervical Assessment Cervical Assessment: Within Functional Limits Thoracic Assessment Thoracic Assessment: Within Functional Limits Lumbar Assessment Lumbar Assessment: Exceptions to WFL(posterior pelvic tilt) Postural Control Postural Control: Within Functional Limits  Balance Static Standing Balance Static Standing - Level of Assistance: 6: Modified independent (Device/Increase time) Dynamic Standing Balance Dynamic Standing - Level of Assistance: 6: Modified independent (Device/Increase time) Extremity/Trunk Assessment RUE Assessment RUE Assessment: Exceptions to WFL(R elbow splint) LUE Assessment LUE Assessment: Within Functional Limits   See Function Navigator for Current Functional Status.  Leotis Shames [facility] 08/05/2017, 11:54 AM

## 2017-08-05 NOTE — Progress Notes (Signed)
Physical Therapy Discharge Summary  Patient Details  Name: Trevor Nunez MRN: [id number] Date of Birth: Mar 29, 1966  Today's Date: 08/05/2017 PT Individual Time: 0915-1000  Second Session pt declined [phone]) PT Individual Time Calculation (min): 45 min  Missed 30 minutes skilled PT    Patient has met 3 of 8 long term goals due to improved balance, improved postural control, increased strength and ability to compensate for deficits.  Patient to discharge at an ambulatory level Modified Independent.  He will likely need to use a w/c for longer distances due to pain in BLEs and and low endurance.    Reasons goals not met:  Pt declined second therapy session on graduation day.  Did not assess gait, stair negotiation, or car transfers.  Based on documentation from previous day's therapy sessions, pt ambulating with distant supervision with hemiwalker, performing car transfers with hemiwalker and supervision.  Pt has not addressed stair negotiation before discharge, however he lives in a first floor apartment with no stairs to negotiate at home.    Recommendation:  Patient will benefit from ongoing skilled PT services in home health setting to continue to advance safe functional mobility, address ongoing impairments in activity tolerance and strength, and minimize fall risk.  Pt will not qualify for home health services with current diagnosis, so provided with HEP hand out for pt to continue at home.   Equipment: hemiwalker and w/c  Reasons for discharge: treatment goals met  Patient/family agrees with progress made and goals achieved: Yes   Skilled PT Intervention: SEssion 1: Pt on phone when PT entered, and requesting PT to return.  Missed first 15 minutes of session.  Session focus on d/c assessment for w/c mobility, transfers, strength, and sensation.  Pt continues to demonstrate decreased sensation to LT on RLE, specifically the lateral border of his foot.  Attempted ambulation and stair  negotiation but pt perseverating on feeling unclean from his BM this morning and requesting to return to room to await OT.  Total assist for w/c mobility back to room.  Pt positioned to comfort with call bell in reach and needs met.   Session 2: Pt reporting 10/10 pain all over and declining therapy.  Missed 30 minutes skilled PT.   PT Discharge Precautions/Restrictions Precautions Precautions: Fall Precaution Booklet Issued: No Restrictions Weight Bearing Restrictions: No Pain Pain Assessment Pain Scale: 0-10 Pain Score: 8  Pain Type: Acute pain Pain Location: Foot Pain Orientation: Right;Left Pain Descriptors / Indicators: Aching Pain Frequency: Constant Pain Onset: On-going Patients Stated Pain Goal: 0 Pain Intervention(s): RN made aware;Emotional support Vision/Perception  Perception Perception: Within Functional Limits Praxis Praxis: Intact  Cognition Overall Cognitive Status: Within Functional Limits for tasks assessed Arousal/Alertness: Awake/alert Orientation Level: Oriented X4 Attention: Selective Selective Attention: Appears intact Memory: Appears intact Awareness: Appears intact Problem Solving: Appears intact Safety/Judgment: Appears intact Sensation Sensation Light Touch: Impaired Detail Light Touch Impaired Details: Impaired RLE(absent to LT on lateral border of R foot) Hot/Cold: Appears Intact Proprioception: Appears Intact Coordination Gross Motor Movements are Fluid and Coordinated: Yes Fine Motor Movements are Fluid and Coordinated: Yes Motor  Motor Motor: Within Functional Limits  Mobility Bed Mobility Bed Mobility: Supine to Sit Supine to Sit: 6: Modified independent (Device/Increase time) Transfers Transfers: Yes Stand Pivot Transfers: 6: Modified independent (Device/Increase time) Locomotion     Trunk/Postural Assessment  Cervical Assessment Cervical Assessment: Within Functional Limits Thoracic Assessment Thoracic Assessment:  Within Functional Limits Lumbar Assessment Lumbar Assessment: Exceptions to WFL(preference for posterior pelvic tilt) Postural  Control Postural Control: Within Functional Limits  Balance Static Standing Balance Static Standing - Level of Assistance: 6: Modified independent (Device/Increase time) Dynamic Standing Balance Dynamic Standing - Level of Assistance: 6: Modified independent (Device/Increase time) Extremity Assessment      RLE Assessment RLE Assessment: Exceptions to [facility] RLE AROM (degrees) RLE Overall AROM Comments: WFL assessed in sitting RLE Strength RLE Overall Strength Comments: unable to tolerate pressure around ankle joint for some testing positions Right Hip Flexion: 3/5(painful) Right Knee Flexion: 4+/5 Right Knee Extension: 3/5(painful) Right Ankle Dorsiflexion: 4+/5 Right Ankle Plantar Flexion: 3/5(painful) LLE Assessment LLE Assessment: Exceptions to WFL LLE AROM (degrees) LLE Overall AROM Comments: WFL assessed in sitting LLE Strength Left Hip Flexion: 4/5 Left Knee Flexion: 4+/5 Left Knee Extension: 4+/5 Left Ankle Dorsiflexion: 4+/5 Left Ankle Plantar Flexion: 3/5(painful with over pressure)   See Function Navigator for Current Functional Status.  Trevor Nunez 08/05/2017, 9:43 AM

## 2017-08-06 NOTE — Progress Notes (Signed)
Pt a&o x 4. No c/o pain at present. Pt DC home today. Family at bedside. Packed all pt belongings. Family and pt escorted to main lobby. No c/o noted.

## 2017-08-06 NOTE — Plan of Care (Signed)
  Problem: Consults Goal: RH GENERAL PATIENT EDUCATION Description See Patient Education module for education specifics. Outcome: Progressing Goal: Skin Care Protocol Initiated - if Braden Score 18 or less Description If consults are not indicated, leave blank or document N/A Outcome: Progressing   Problem: RH SKIN INTEGRITY Goal: RH STG SKIN FREE OF INFECTION/BREAKDOWN Description Min assist.  Outcome: Progressing Goal: RH STG ABLE TO PERFORM INCISION/WOUND CARE W/ASSISTANCE Description STG Able To Perform Incision/Wound Care With min Assistance.  Outcome: Progressing   Problem: RH SAFETY Goal: RH STG ADHERE TO SAFETY PRECAUTIONS W/ASSISTANCE/DEVICE Description STG Adhere to Safety Precautions With min Assistance/Device.  Outcome: Progressing   Problem: RH PAIN MANAGEMENT Goal: RH STG PAIN MANAGED AT OR BELOW PT'S PAIN GOAL Description < 4  Outcome: Progressing   

## 2017-08-06 NOTE — Progress Notes (Addendum)
[place] PHYSICAL MEDICINE & REHABILITATION     PROGRESS NOTE    Subjective/Complaints: No new issues overnight Received discharge instructions yesterday Continues to have thigh numbness but no new changes  ROS: Patient denies fever, rash, sore throat, blurred vision, nausea, vomiting, diarrhea, cough, shortness of breath or chest pain,   headache, or mood change. .   Objective: Vital Signs: Blood pressure 123/87, pulse 77, temperature 98 F (36.7 C), temperature source Oral, resp. rate 19, height 6' (1.829 m), weight 136 kg (299 lb 13.2 oz), SpO2 93 %. No results found. Recent Labs    08/04/17 0616  WBC 7.4  HGB 14.0  HCT 42.9  PLT 316   Recent Labs    08/04/17 0616  NA 134*  K 4.6  CL 99*  GLUCOSE 113*  BUN 30*  CREATININE 1.79*  CALCIUM 9.0   CBG (last 3)  No results for input(s): GLUCAP in the last 72 hours.  Wt Readings from Last 3 Encounters:  08/06/17 136 kg (299 lb 13.2 oz)  07/27/17 134.2 kg (295 lb 13.7 oz)  07/16/17 127 kg (280 lb)    Physical Exam:  Constitutional: No distress . Vital signs reviewed. HEENT: EOMI, oral membranes moist Cardiovascular: RRR without murmur. No JVD    Respiratory: CTA Bilaterally without wheezes or rales. Normal effort    GI: BS +, non-tender, non-distended   Musculoskeletal: RUE  in splint. Knees minimally tender to palpation Neurological: He is alert.  Motor: LUE: 5/5 proximal to distal LLE: HF 4/5, KE 5/5, ADF 5/5 RLE: HF 4/5, KE 4/5, ADF 5/5 RUE: shoulder abduction 4/5, elbow dressed, hand grip 4+/5 (stable) Decreased sensation anterolateral thighs bilaterally Skin: RUE with dressing/splint C/D/I Psychiatric: more pleasant today   Assessment/Plan: 1. Functional deficits secondary to debility from multiple medical issues including gout Stable for D/C today F/u PCP in 3-4 weeks F/u PM&R 2 weeks See D/C summary See D/C instructions  Function:  Bathing Bathing position Bathing activity did not occur:  Refused Position: Shower  Bathing parts Body parts bathed by patient: Right arm, Left arm, Chest, Abdomen, Front perineal area, Buttocks, Right upper leg, Left upper leg, Right lower leg, Left lower leg, Back Body parts bathed by helper: Right lower leg, Left lower leg, Back, Buttocks  Bathing assist Assist Level: More than reasonable time      Upper Body Dressing/Undressing Upper body dressing Upper body dressing/undressing activity did not occur: Refused What is the patient wearing?: Pull over shirt/dress     Pull over shirt/dress - Perfomed by patient: Thread/unthread right sleeve, Thread/unthread left sleeve, Put head through opening, Pull shirt over trunk          Upper body assist Assist Level: More than reasonable time   Set up : To obtain clothing/put away  Lower Body Dressing/Undressing Lower body dressing Lower body dressing/undressing activity did not occur: Refused What is the patient wearing?: Underwear, Pants, Non-skid slipper socks Underwear - Performed by patient: Thread/unthread right underwear leg, Thread/unthread left underwear leg, Pull underwear up/down Underwear - Performed by helper: Thread/unthread right underwear leg Pants- Performed by patient: Thread/unthread right pants leg, Thread/unthread left pants leg, Pull pants up/down Pants- Performed by helper: Thread/unthread right pants leg, Thread/unthread left pants leg Non-skid slipper socks- Performed by patient: Don/doff right sock, Don/doff left sock Non-skid slipper socks- Performed by helper: Don/doff right sock, Don/doff left sock               TED Hose - Performed by helper: Don/doff  right TED hose, Don/doff left TED hose  Lower body assist Assist for lower body dressing: More than reasonable time      Toileting Toileting Toileting activity did not occur: N/A Toileting steps completed by patient: Adjust clothing prior to toileting, Adjust clothing after toileting Toileting steps completed by  helper: Performs perineal hygiene Toileting Assistive Devices: Grab bar or rail  Toileting assist Assist level: More than reasonable time   Transfers Chair/bed transfer Chair/bed transfer activity did not occur: N/A Chair/bed transfer method: Stand pivot, Ambulatory Chair/bed transfer assist level: No Help, no cues, assistive device, takes more than a reasonable amount of time Chair/bed transfer assistive device: Armrests, Other(HW)     Locomotion Ambulation Ambulation activity did not occur: N/A   Max distance: 70 Assist level: Touching or steadying assistance (Pt > 75%)   Wheelchair   Type: Manual Max wheelchair distance: 150 Assist Level: No help, No cues, assistive device, takes more than reasonable amount of time  Cognition Comprehension Comprehension assist level: Follows complex conversation/direction with no assist  Expression Expression assist level: Expresses complex ideas: With no assist  Social Interaction Social Interaction assist level: Interacts appropriately with others - No medications needed.  Problem Solving Problem solving assist level: Solves complex problems: Recognizes & self-corrects  Memory Memory assist level: Complete Independence: No helper   Medical Problem List and Plan: 1.  Decreased functional mobility secondary to bilateral lower extremity weakness and distal sensory loss, edema and decreased sensation.    -Plan follow-up outpatient with neurology services for paraspinal EMG and possible paraspinal muscle biopsy    2.  DVT Prophylaxis/Anticoagulation: Subcutaneous heparin.  Venous Doppler studies negative 3. Pain Management: Elavil 25 mg nightly   -ROM and local care to right knee/elbow   -Lyrica 75 mg twice daily (increased 4/15)   -?meralgia paresthetica. No evidence of lumbar radic on exam or MRI 4. Mood: Provide emotional support 5. Neuropsych: This patient is capable of making decisions on his own behalf.  -anxiety an issue 6. Skin/Wound  Care: Routine skin checks 7. Fluids/Electrolytes/Nutrition:     -encourage PO, appropriate dietary intake 8.  Acute kidney injury on chronic kidney disease stage III.  Continue Demadex 60 mg daily and Aldactone 25 mg daily.  Follow-up renal services   Creatinine  1.79    Continue to monitor 9.  Gout.  Uric acid level 13.4.  Right knee joint aspiration 07/25/2017.  Prednisone as directed 07/26/2017 x 5 days completed   Sx improved 10.  Hypertension.  Coreg 12.5 mg twice daily Vitals:   08/05/17 1511 08/06/17 0539  BP: (!) 124/93 123/87  Pulse: 84 77  Resp: (!) 22 19  Temp: 98.1 F (36.7 C) 98 F (36.7 C)  SpO2: 95% 93%   Controlled 08/06/2017 11.  Right arm injury after fall, ?occult fracture.  Conservative care per orthopedic services and fitted with a posterior elbow splint.  Weightbearing as tolerated 12.  OSA.  Recently diagnosed in February.  Still awaiting CPAP machine at home.  Patient has been refusing. 13.  Diastolic congestive heart failure.  Monitor for any signs of fluid overload Filed Weights   08/04/17 0521 08/05/17 0539 08/06/17 0539  Weight: (!) 137.2 kg (302 lb 7.5 oz) (!) 136.4 kg (300 lb 11.3 oz) 136 kg (299 lb 13.2 oz)    Weights remain stable 14.  Prediabetes with Medical noncompliance.    Provide counseling  -intermittent hyperglycemia 15. Transaminitis: reactive?  LFTs nearly back to normal on 4/18  16. Leukocytosis   WBC down to 7.4   Afebrile       LOS (Days) 10 A FACE TO FACE EVALUATION WAS PERFORMED  Erick Colace, MD 08/06/2017 12:05 PM

## 2017-08-07 ENCOUNTER — Inpatient Hospital Stay (HOSPITAL_COMMUNITY): Payer: Medicaid Other

## 2017-08-08 NOTE — Progress Notes (Signed)
Social Work  Discharge Note  The overall goal for the admission was met for:   Discharge location: Yes - home with roomate  Length of Stay: Yes - 10 days  Discharge activity level: Yes - modified independent  Home/community participation: Yes  Services provided included: MD, RD, PT, OT, RN, Pharmacy and SW  Financial Services: Medicaid  Follow-up services arranged: DME: 20x18 lightweight w/c, cushion, hemi walker via [place] and Patient/Family has no preference for HH/DME agencies  Comments (or additional information):  Pt does not have a qualifying diagnosis under Medicaid guidelines for any HH therapies.  Pt aware.  Patient/Family verbalized understanding of follow-up arrangements: Yes  Individual responsible for coordination of the follow-up plan: pt  Confirmed correct DME delivered: Jaimey Franchini 08/08/2017    Tamiah Dysart

## 2017-08-17 DEATH — deceased

## 2017-08-18 ENCOUNTER — Telehealth: Payer: Self-pay | Admitting: Diagnostic Neuroimaging

## 2017-08-18 NOTE — Telephone Encounter (Signed)
Pts caregiver called to inform our office the pt passed away as of Sep 11, 2017. Lurena Joiner can be reached at [phone]

## 2017-08-25 ENCOUNTER — Encounter: Payer: Medicaid Other | Admitting: Diagnostic Neuroimaging

## 2020-01-19 IMAGING — MR MR KNEE*R* W/O CM
4 of 5 series · 18 of 40 positions shown · non-contrast
Comparison: None.

CLINICAL DATA: Acute anterior knee pain for the past 2 weeks. No
known injury.

EXAM:
MRI OF THE RIGHT KNEE WITHOUT CONTRAST
TECHNIQUE: Multiplanar, multisequence MR imaging of the knee was performed. No
intravenous contrast was administered.

[Series 4: PD fat-sat · axial · 3.0mm · 0.35mm/px · z∈[-143,+32]mm · 9 of 51 slices shown (1 of 3)]
[im 1/51]
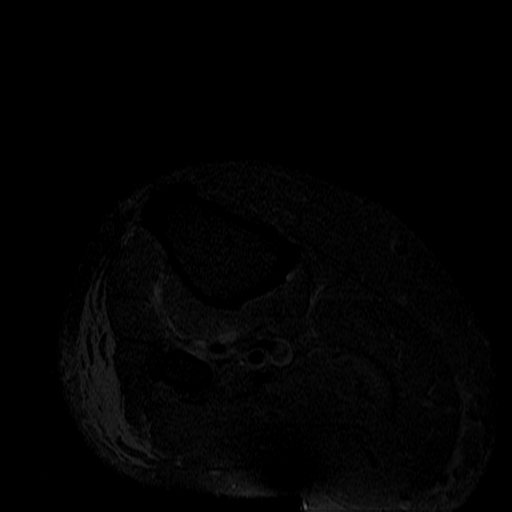
[im 10/51]
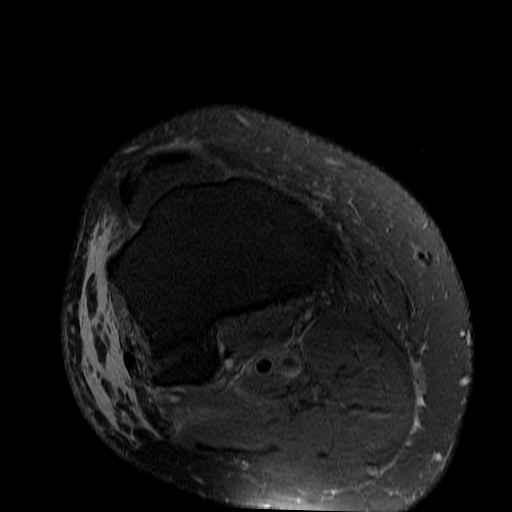
[im 14/51]
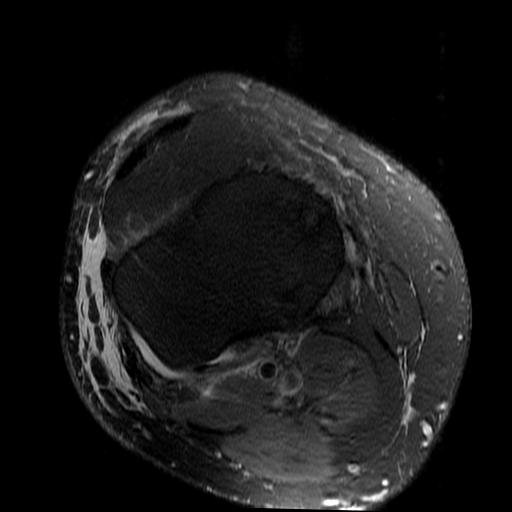
[im 23/51]
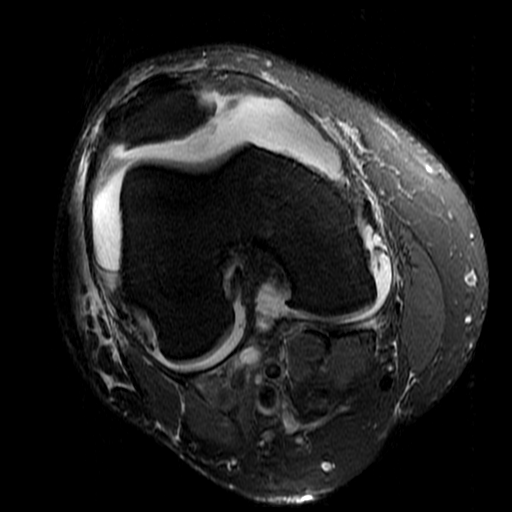
[im 28/51]
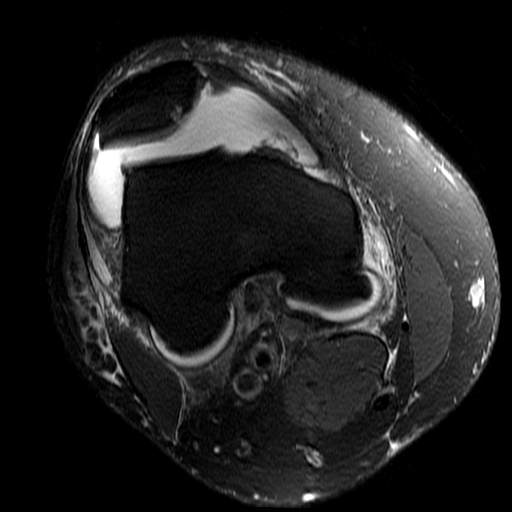
[im 37/51]
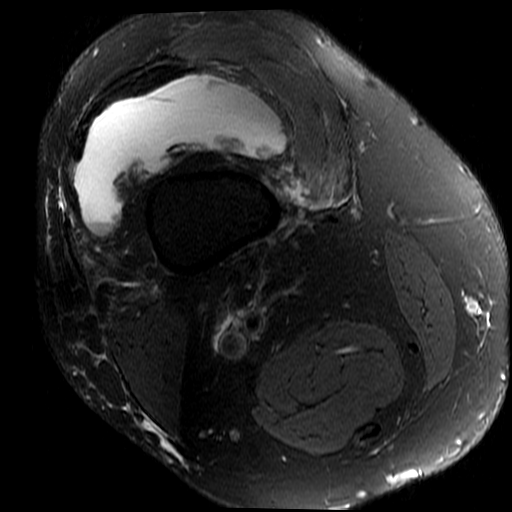
[im 41/51]
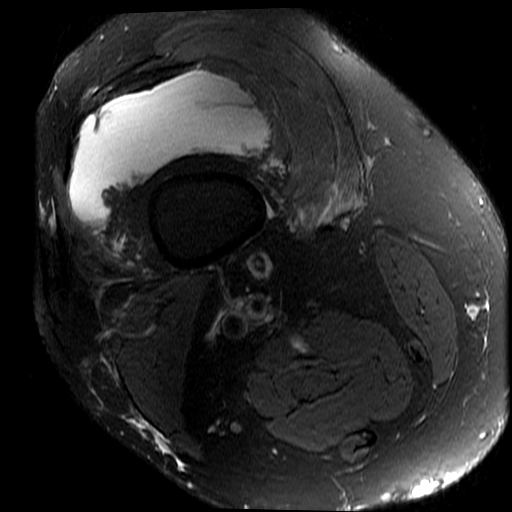
[im 46/51]
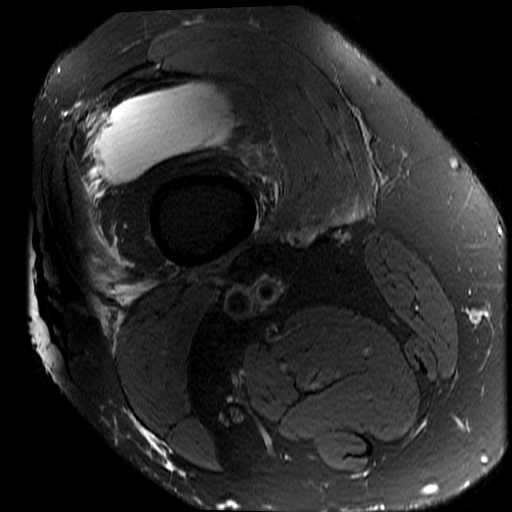
[im 51/51]
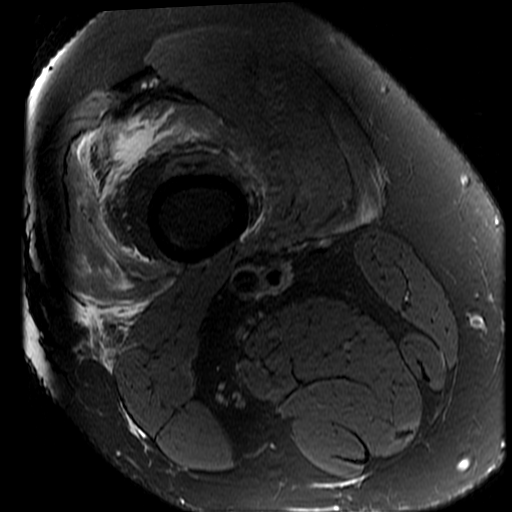

[Series 11: PD fat-sat · coronal · 3.0mm · 0.35mm/px · 3 of 35 slices shown (2 of 3)]
[im 6/35]
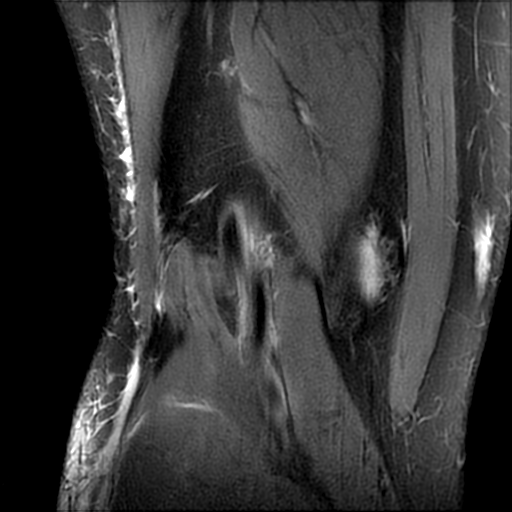
[im 18/35]
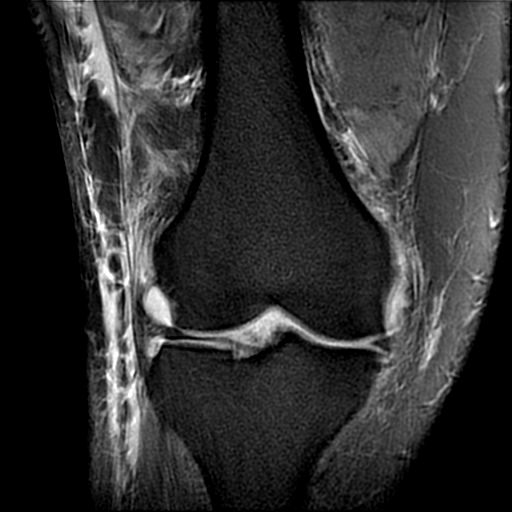
[im 29/35]
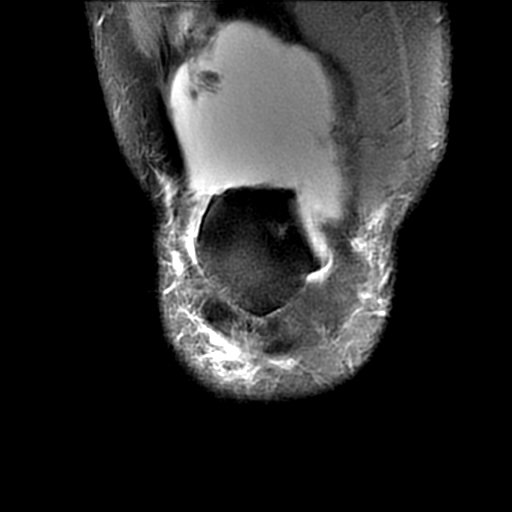

[Series 12: T2 fat-sat · coronal · 3.0mm · 0.35mm/px · 3 of 35 slices shown]
[im 6/35]
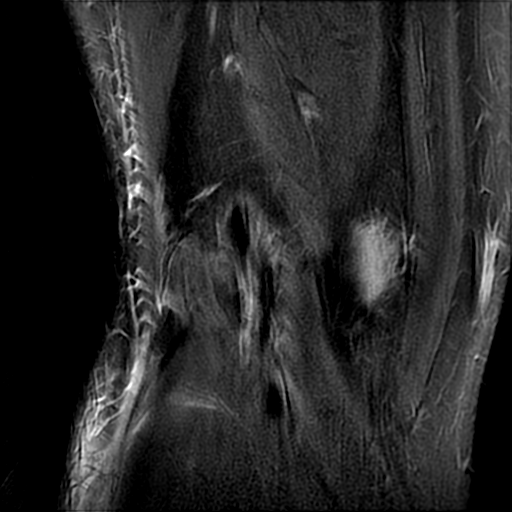
[im 18/35]
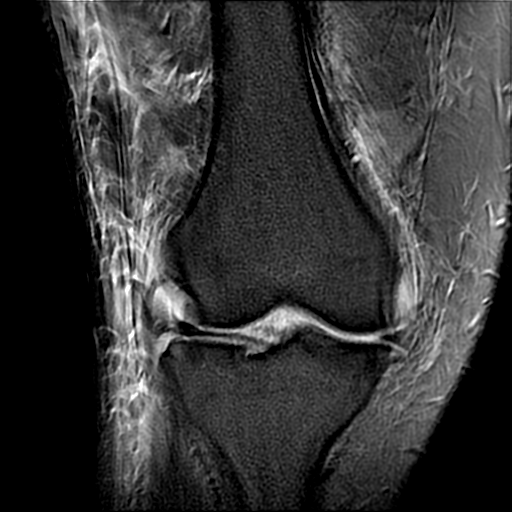
[im 29/35]
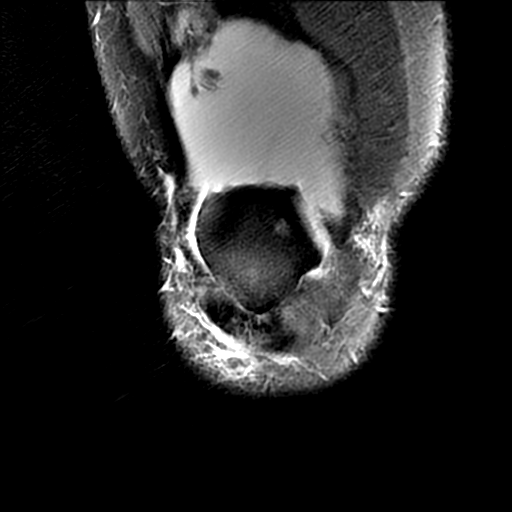

[Series 13: PD fat-sat · sagittal · 3.0mm · 0.37mm/px · 3 of 31 slices shown (3 of 3)]
[im 6/31]
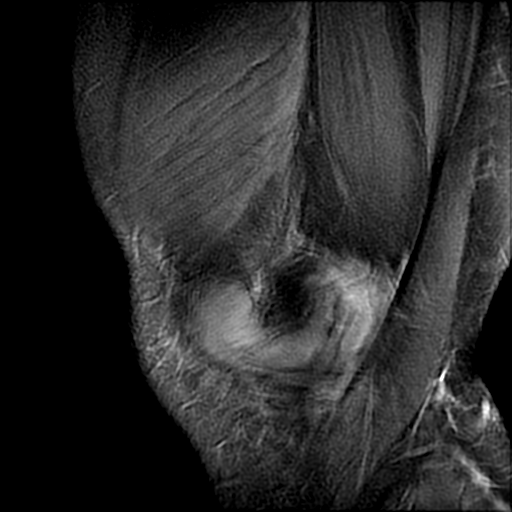
[im 16/31]
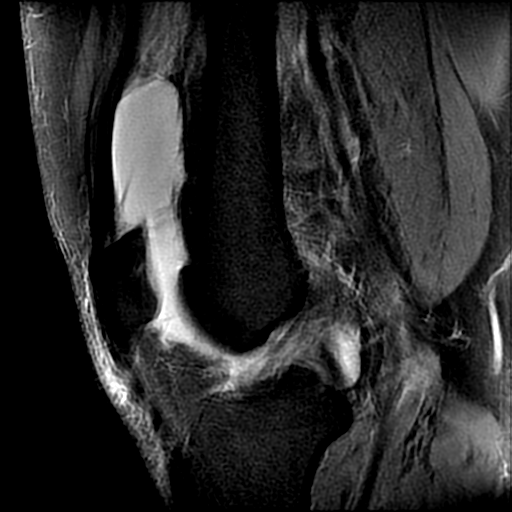
[im 26/31]
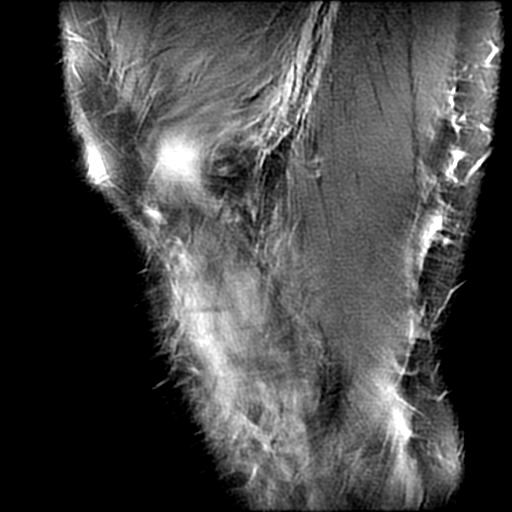

[18 of 40 positions shown; findings below may reference images not displayed]

FINDINGS: Despite efforts by the technologist and patient, motion artifact is
present on today's exam and could not be eliminated. This reduces
exam sensitivity and specificity.

MENISCI

Medial meniscus:  Blunting of the body free edge.

Lateral meniscus:  Intact.

LIGAMENTS

Cruciates:  Intact ACL and PCL.

Collaterals: Medial collateral ligament is intact. Lateral
collateral ligament complex is intact.

CARTILAGE

Patellofemoral: High-grade near full-thickness cartilage loss over
the patellar apex extending into the medial facet, with underlying
subchondral cystic change.

Medial: High-grade near full-thickness cartilage loss over the
central weight-bearing medial femoral condyle and medial tibial
plateau with underlying subchondral marrow edema.

Lateral: Small area of delamination along the central lateral tibial
plateau. No full-thickness defect.

Joint: Large joint effusion with synovitis. Small layering component
within the lateral suprapatellar joint space could reflect a small
amount of hemorrhage. No plical thickening. Normal Hoffa's fat.

Popliteal Fossa:  No Baker cyst. Intact popliteus tendon.

Extensor Mechanism:  Intact quadriceps tendon and patellar tendon.

Bones: No fracture or dislocation. Small tricompartmental
osteophytes.

Other: Moderate subcutaneous edema along the lateral knee. No focal
fluid collection. Feathery edema within the distal vastus lateralis
muscle.
IMPRESSION: 1. Large joint effusion with synovitis. Small layering component
within the lateral suprapatellar joint space could reflect a small
amount of hemorrhage. Consider further evaluation with
arthrocentesis.
2. Small radial tear of the medial meniscus body.
3. High-grade cartilage loss in the medial and patellofemoral
compartments as described above.
4. Small amount of edema within the distal vastus lateralis muscle
may reflect strain.

## 2021-05-06 ENCOUNTER — Other Ambulatory Visit: Payer: Self-pay | Admitting: Family
